# Patient Record
Sex: Female | Born: 1970 | Race: Black or African American | Hispanic: No | Marital: Married | Smoking: Never smoker
Health system: Southern US, Community
[De-identification: ages and names within clinical notes are randomized; demographics above are authoritative.]

## PROBLEM LIST (undated history)

## (undated) DIAGNOSIS — I10 Essential (primary) hypertension: Secondary | ICD-10-CM

## (undated) DIAGNOSIS — M545 Low back pain, unspecified: Secondary | ICD-10-CM

## (undated) DIAGNOSIS — E669 Obesity, unspecified: Secondary | ICD-10-CM

## (undated) DIAGNOSIS — F32A Depression, unspecified: Secondary | ICD-10-CM

## (undated) DIAGNOSIS — O039 Complete or unspecified spontaneous abortion without complication: Secondary | ICD-10-CM

## (undated) DIAGNOSIS — R0602 Shortness of breath: Secondary | ICD-10-CM

## (undated) DIAGNOSIS — R002 Palpitations: Secondary | ICD-10-CM

## (undated) DIAGNOSIS — R7989 Other specified abnormal findings of blood chemistry: Secondary | ICD-10-CM

## (undated) DIAGNOSIS — D649 Anemia, unspecified: Secondary | ICD-10-CM

## (undated) DIAGNOSIS — K5900 Constipation, unspecified: Secondary | ICD-10-CM

## (undated) DIAGNOSIS — M2550 Pain in unspecified joint: Secondary | ICD-10-CM

## (undated) DIAGNOSIS — N289 Disorder of kidney and ureter, unspecified: Secondary | ICD-10-CM

## (undated) DIAGNOSIS — L709 Acne, unspecified: Secondary | ICD-10-CM

## (undated) DIAGNOSIS — O1420 HELLP syndrome (HELLP), unspecified trimester: Secondary | ICD-10-CM

## (undated) DIAGNOSIS — E739 Lactose intolerance, unspecified: Secondary | ICD-10-CM

## (undated) DIAGNOSIS — R252 Cramp and spasm: Secondary | ICD-10-CM

## (undated) DIAGNOSIS — I319 Disease of pericardium, unspecified: Secondary | ICD-10-CM

## (undated) DIAGNOSIS — F419 Anxiety disorder, unspecified: Secondary | ICD-10-CM

## (undated) DIAGNOSIS — R079 Chest pain, unspecified: Secondary | ICD-10-CM

## (undated) DIAGNOSIS — G4730 Sleep apnea, unspecified: Secondary | ICD-10-CM

## (undated) DIAGNOSIS — E538 Deficiency of other specified B group vitamins: Secondary | ICD-10-CM

## (undated) DIAGNOSIS — E079 Disorder of thyroid, unspecified: Secondary | ICD-10-CM

## (undated) DIAGNOSIS — R5383 Other fatigue: Secondary | ICD-10-CM

## (undated) DIAGNOSIS — E559 Vitamin D deficiency, unspecified: Secondary | ICD-10-CM

## (undated) DIAGNOSIS — Z91018 Allergy to other foods: Secondary | ICD-10-CM

## (undated) HISTORY — DX: Obesity, unspecified: E66.9

## (undated) HISTORY — DX: Essential (primary) hypertension: I10

## (undated) HISTORY — DX: Disease of pericardium, unspecified: I31.9

## (undated) HISTORY — DX: Palpitations: R00.2

## (undated) HISTORY — DX: Vitamin D deficiency, unspecified: E55.9

## (undated) HISTORY — DX: Other fatigue: R53.83

## (undated) HISTORY — DX: Pain in unspecified joint: M25.50

## (undated) HISTORY — PX: DILATION AND CURETTAGE OF UTERUS: SHX78

## (undated) HISTORY — DX: Low back pain, unspecified: M54.50

## (undated) HISTORY — PX: ABDOMINOPLASTY: SHX5355

## (undated) HISTORY — PX: CERVICAL ABLATION: SHX5771

## (undated) HISTORY — DX: HELLP syndrome (HELLP), unspecified trimester: O14.20

## (undated) HISTORY — DX: Constipation, unspecified: K59.00

## (undated) HISTORY — DX: Sleep apnea, unspecified: G47.30

## (undated) HISTORY — DX: Acne, unspecified: L70.9

## (undated) HISTORY — DX: Cramp and spasm: R25.2

## (undated) HISTORY — DX: Lactose intolerance, unspecified: E73.9

## (undated) HISTORY — DX: Depression, unspecified: F32.A

## (undated) HISTORY — DX: Anxiety disorder, unspecified: F41.9

## (undated) HISTORY — DX: Disorder of thyroid, unspecified: E07.9

## (undated) HISTORY — DX: Complete or unspecified spontaneous abortion without complication: O03.9

## (undated) HISTORY — DX: Disorder of kidney and ureter, unspecified: N28.9

## (undated) HISTORY — DX: Chest pain, unspecified: R07.9

## (undated) HISTORY — DX: Anemia, unspecified: D64.9

## (undated) HISTORY — DX: Allergy to other foods: Z91.018

## (undated) HISTORY — PX: TUBAL LIGATION: SHX77

## (undated) HISTORY — DX: Deficiency of other specified B group vitamins: E53.8

## (undated) HISTORY — DX: Shortness of breath: R06.02

---

## 1998-04-13 ENCOUNTER — Other Ambulatory Visit: Admission: RE | Admit: 1998-04-13 | Discharge: 1998-04-13 | Payer: Self-pay | Admitting: Obstetrics and Gynecology

## 1999-04-25 ENCOUNTER — Other Ambulatory Visit: Admission: RE | Admit: 1999-04-25 | Discharge: 1999-04-25 | Payer: Self-pay | Admitting: Obstetrics and Gynecology

## 2002-06-27 ENCOUNTER — Other Ambulatory Visit: Admission: RE | Admit: 2002-06-27 | Discharge: 2002-06-27 | Payer: Self-pay | Admitting: Gynecology

## 2003-02-09 ENCOUNTER — Other Ambulatory Visit: Admission: RE | Admit: 2003-02-09 | Discharge: 2003-02-09 | Payer: Self-pay | Admitting: Gynecology

## 2003-08-30 ENCOUNTER — Encounter (INDEPENDENT_AMBULATORY_CARE_PROVIDER_SITE_OTHER): Payer: Self-pay | Admitting: Specialist

## 2003-08-30 ENCOUNTER — Ambulatory Visit (HOSPITAL_COMMUNITY): Admission: RE | Admit: 2003-08-30 | Discharge: 2003-08-30 | Payer: Self-pay | Admitting: Gynecology

## 2004-04-16 ENCOUNTER — Other Ambulatory Visit: Admission: RE | Admit: 2004-04-16 | Discharge: 2004-04-16 | Payer: Self-pay | Admitting: Gynecology

## 2004-09-14 ENCOUNTER — Inpatient Hospital Stay (HOSPITAL_COMMUNITY): Admission: AD | Admit: 2004-09-14 | Discharge: 2004-09-18 | Payer: Self-pay | Admitting: Gynecology

## 2004-09-15 ENCOUNTER — Encounter (INDEPENDENT_AMBULATORY_CARE_PROVIDER_SITE_OTHER): Payer: Self-pay | Admitting: *Deleted

## 2004-09-19 ENCOUNTER — Encounter: Admission: RE | Admit: 2004-09-19 | Discharge: 2004-10-19 | Payer: Self-pay | Admitting: Gynecology

## 2004-09-20 ENCOUNTER — Inpatient Hospital Stay (HOSPITAL_COMMUNITY): Admission: AD | Admit: 2004-09-20 | Discharge: 2004-09-20 | Payer: Self-pay | Admitting: Gynecology

## 2004-10-20 ENCOUNTER — Encounter: Admission: RE | Admit: 2004-10-20 | Discharge: 2004-10-29 | Payer: Self-pay | Admitting: Gynecology

## 2004-11-28 ENCOUNTER — Ambulatory Visit (HOSPITAL_BASED_OUTPATIENT_CLINIC_OR_DEPARTMENT_OTHER): Admission: RE | Admit: 2004-11-28 | Discharge: 2004-11-28 | Payer: Self-pay | Admitting: Gynecology

## 2005-10-13 ENCOUNTER — Encounter: Admission: RE | Admit: 2005-10-13 | Discharge: 2005-10-13 | Payer: Self-pay | Admitting: Surgical Oncology

## 2005-10-31 ENCOUNTER — Other Ambulatory Visit: Admission: RE | Admit: 2005-10-31 | Discharge: 2005-10-31 | Payer: Self-pay | Admitting: Gynecology

## 2007-01-15 ENCOUNTER — Other Ambulatory Visit: Admission: RE | Admit: 2007-01-15 | Discharge: 2007-01-15 | Payer: Self-pay | Admitting: Gynecology

## 2008-01-28 ENCOUNTER — Other Ambulatory Visit: Admission: RE | Admit: 2008-01-28 | Discharge: 2008-01-28 | Payer: Self-pay | Admitting: Gynecology

## 2009-02-20 ENCOUNTER — Ambulatory Visit: Payer: Self-pay | Admitting: Gynecology

## 2009-02-20 ENCOUNTER — Other Ambulatory Visit: Admission: RE | Admit: 2009-02-20 | Discharge: 2009-02-20 | Payer: Self-pay | Admitting: Gynecology

## 2009-02-20 ENCOUNTER — Encounter: Payer: Self-pay | Admitting: Gynecology

## 2009-02-27 ENCOUNTER — Ambulatory Visit: Payer: Self-pay | Admitting: Gynecology

## 2009-04-24 ENCOUNTER — Encounter (HOSPITAL_COMMUNITY): Admission: RE | Admit: 2009-04-24 | Discharge: 2009-05-24 | Payer: Self-pay | Admitting: Endocrinology

## 2010-02-22 ENCOUNTER — Other Ambulatory Visit: Admission: RE | Admit: 2010-02-22 | Discharge: 2010-02-22 | Payer: Self-pay | Admitting: Gynecology

## 2010-02-22 ENCOUNTER — Ambulatory Visit: Payer: Self-pay | Admitting: Gynecology

## 2010-09-16 ENCOUNTER — Encounter: Payer: Self-pay | Admitting: Endocrinology

## 2011-01-10 NOTE — Op Note (Signed)
NAMESHAVONTE, ZHAO               ACCOUNT NO.:  [id number]   MEDICAL RECORD NO.:  [id number]          PATIENT TYPE:  INP   LOCATION:  9374                          FACILITY:  [facility]   PHYSICIAN:  Juan H. Lily Peer, M.D.DATE OF BIRTH:  01-Jun-1971   DATE OF PROCEDURE:  09/15/2004  DATE OF DISCHARGE:                                 OPERATIVE REPORT   INDICATION FOR OPERATION:  A 40 year old, gravida 3, para 1, AB 1 at [redacted]  weeks gestation with severe preeclampsia/HELLP syndrome, and an unfavorable  cervix.  The patient had been given, in MAU, several doses of labetalol to  bring her blood pressure down, for a total of 100 mg IV.  She had also had  received dexamethasone 10 mg IV as well, and platelets were to be transfused  in the operating room.   PREOPERATIVE DIAGNOSES:  1.  A 33 week intrauterine pregnancy.  2.  Severe preeclampsia (hemolysis, elevated liver enzymes, and low platelet      count).  3.  Unfavorable cervix.   POSTOPERATIVE DIAGNOSES:  1.  A 33 week intrauterine pregnancy.  2.  Severe preeclampsia (hemolysis, elevated liver enzymes, and low platelet      count).  3.  Unfavorable cervix.   ANESTHESIA:  General endotracheal anesthesia.   SURGEON:  Juan H. Lily Peer, M.D.   PROCEDURE PERFORMED:  Emergency primary lower uterine segment transverse  cesarean section.   FINDINGS:  Viable female infant, Apgars of 6 and 8, weight 1841 g, arterial  cord pH of 7.27, clear amniotic fluid, normal maternal pelvic anatomy.   DESCRIPTION OF OPERATION:  After the patient was adequately counseled, she  was taken to the operating room where she underwent successful general  endotracheal anesthesia.  The abdomen was prepped and draped in the usual  sterile fashion.  Pfannenstiel skin incision was made 2 cm above the  symphysis pubis.  The incision was carried down through the skin,  subcutaneous tissue, down to the rectus fascia, whereby a midline nick was  made.  The fascia was  incised in a transverse fashion.  The midline raphe  was entered.  The peritoneal cavity was entered cautiously.  The bladder  flap was established.  The lower uterine segment was incised in a transverse  fashion.  Clear amniotic fluid was present.  The newborn was delivered,  nasopharyngeal air with bulb suction, and the cord was doubly clamped and  excised and passed off to the neonatologists who were in attendance, who  gave the above-mentioned parameters.  After cord blood was obtained, the  placenta was delivered from the intrauterine cavity and submitted for  histological evaluation.  The uterus was then exteriorized.  The  intrauterine cavity was swept clear of remaining products of conception.  The transverse uterine incision was closed in a running locking stitch  fashion with 0 Vicryl suture.  Several areas continued to ooze requiring  pressure and due to the fact that her platelets were low, it was decided to  abort the bilateral tubal sterilization in the event of increased risk for  bleeding, and sterilization will be  done at a later date.  After compression  on the lower uterine segment and ascertaining adequate hemostasis after  several hemostatic sutures, the uterus was then re-placed back into the  pelvic cavity.  The pelvic cavity was copiously irrigated with normal saline  solution.  The transverse incision was inspected again; no bleeding was  present.  The visceral peritoneum was not reapproximated.  The rectus fascia  was closed with a running locking stitch of 0 Vicryl suture, and the skin  was reapproximated with skin clips followed by placing Xeroform gauze and  pressure dressing.  The patient was extubated and transferred to recovery  room with stable vital signs.  Blood loss for procedure was 1000 mL.  IV  fluids 1020 mL of lactated Ringer's.  The patient received 1 unit of  platelets, transfused in the operating room.  Urine output was 800 mL.  She  received 2 g  of Ancef prophylactically.  Placenta was submitted to  pathology.  Neonatologist was in attendance, and sponge count and needle  count were correct.     Juan   JHF/MEDQ  D:  09/15/2004  T:  09/15/2004  Job:  [id number]

## 2011-01-10 NOTE — H&P (Signed)
[name], [name]                           ACCOUNT NO.:  [id number]   MEDICAL RECORD NO.:  [id number]                   PATIENT TYPE:  AMB   LOCATION:  SDC                                  FACILITY:  [facility]   PHYSICIAN:  Timothy P. Fontaine, M.D.           DATE OF BIRTH:  Nov 17, 1970   DATE OF ADMISSION:  08/30/2003  DATE OF DISCHARGE:                                HISTORY & PHYSICAL   CHIEF COMPLAINT:  Missed abortion.   HISTORY OF PRESENT ILLNESS:  A 40 year old G2, P0, female at approximately  six weeks with a missed abortion.  She has been followed with serial  ultrasounds with a persistent gestational sac consistent with m missed  abortion.  She is admitted for a suction D&C.  Her blood type is O positive.   PAST MEDICAL HISTORY:  Uncomplicated.   PAST SURGICAL HISTORY:  Uncomplicated.   ALLERGIES:  IODINE.   REVIEW OF SYSTEMS:  Noncontributory.   FAMILY HISTORY:  Noncontributory.   SOCIAL HISTORY:  Noncontributory.   ADMISSION PHYSICAL EXAMINATION:  VITAL SIGNS:  Afebrile, vital signs are  stable.  HEENT:  Normal.  CHEST:  Lungs are clear.  CARDIAC:  Regular rate, no rubs, murmurs, or gallops.  ABDOMEN:  Benign.  PELVIC:  External, BUS, vagina normal.  Cervix grossly normal with some  blood-staining, closed.  Uterus grossly normal in size, soft.  Adnexa  without masses or tenderness.   ASSESSMENT:  A 40 year old with missed abortion, attempted expectant  management without passage of tissue, now for D&C.  Risks, benefits,  indications, and alternatives for suction D&C were discussed with the  patient and her husband to include expected intraoperative and postoperative  courses.  The risks of bleeding, transfusion, infection, uterine  perforation, damage to internal organs including bowel, bladder, ureters,  vessels, and nerves necessitating major exploratory or reparative surgeries  and future reparative surgeries including ostomy formation, were all  discussed,  understood, and accepted.  [name] questions were answered  and she is ready to proceed with surgery.  Blood type is O positive.                                               Timothy P. Audie Box, M.D.    TPF/MEDQ  D:  08/30/2003  T:  08/30/2003  Job:  [id number]

## 2011-01-10 NOTE — Op Note (Signed)
NAMEAVERLEIGH, [name]               ACCOUNT NO.:  [id number]   MEDICAL RECORD NO.:  [id number]          PATIENT TYPE:  AMB   LOCATION:  NESC                         FACILITY:  [facility]   PHYSICIAN:  Juan H. Lily Peer, M.D.DATE OF BIRTH:  [birth year]   DATE OF PROCEDURE:  11/28/2004  DATE OF DISCHARGE:                                 OPERATIVE REPORT   INDICATION FOR OPERATION:  A 40 year old gravida 3, para 2, AB 1, with the  request for elective permanent sterilization.  The patient previously  cancelled preoperatively and had been given literature information from the  [employer] of [facility]/GY[facility] on laparoscopic tubal sterilization.  The patient  is fully aware that this is a permanent form of sterilization and that she  would not be able to have any more children.   PREOPERATIVE DIAGNOSIS:  Request for elective permanent sterilization.   POSTOPERATIVE DIAGNOSIS:  Request for elective permanent sterilization.   ANESTHESIA:  General endotracheal.   SURGEON:  Juan H. Lily Peer, M.D.   PROCEDURE PERFORMED:  Laparoscopic tubal sterilization.  Bilateral  cauterization with transection of fallopian tubes.   FINDINGS:  Retroverted uterus, normal fallopian tubes and ovaries.  Endometriotic implant at the fundal posterior uterus.  Normal-appearing  surface of the liver and surface of gallbladder seen.  The appendix not  visualized.   DESCRIPTION OF OPERATION:  After the patient was adequately counseled, she  was taken to the operating room where she underwent a successful general  endotracheal anesthesia.  She had received 1 g of Cefotan for prophylaxis.  The abdomen, vagina, and perineum were prepped and draped in the usual  sterile fashion.  Examination under anesthesia demonstrated a slightly  retroverted uterus.  A Hulka tenaculum was placed for manipulation during  the laparoscopic procedure, and the bladder had been evacuated of its  contents with a red rubber Roxan Hockey for  approximately 50 mL.  After the  drapes were in place, a small stab incision was made underneath the  umbilicus followed by insertion of the Veress needle.  Opening intra-  abdominal pressure was 5 mmHg.  A 10 mm trocar was inserted into the  peritoneal cavity.  A second puncture site with a 5 mm trocar was made  approximately 2 cm above the symphysis pubis in the midline whereby a probe  was inserted through the 5 mm port, and a systematic inspection of the  pelvis demonstrated a retroverted uterus, normal tubes and ovary, but an  endometriotic implant at the fundal posterior aspect of the uterine serosa  was noted.  The probe was removed, and a self-retaining grasper was placed  at the distal portion of the right fallopian tube and with a Kleppinger  forceps, a 2 cm segment was cauterized to the level of the mesosalpinx and  with laparoscopic scissors the fallopian tube was transected at the proximal  portion of the fallopian tube.  Similar procedure was carried out on the  contralateral side.  Pre and poststerilization pictures were obtained.  A  copy will be kept in the patient's record at [facility], and a  second set  will be kept in the patient's record at [facility] for  patient's review and to keep as part of her permanent record.  The fundal  aspect of the uterine serosa was cauterized.  After completion of the  operation, there was hemostasis that was evident.  The instruments were  removed after the carbon dioxide was removed from the peritoneal cavity, and  the subumbilical fascia was approximated with a figure-of-eight of 0 Vicryl  suture.  The subcuticular stitch was reapproximated with the 4-0 plain  catgut suture, and the skin was reapproximated at both port sites with  Dermabond glue, and 0.25% Marcaine was infiltrated at both incision sites  for  postoperative analgesia for a total of 10 mL.  The Hulka tenaculum was  removed; the cervix was inspected,  and no bleeding.  The patient was  extubated, transferred to recovery room with stable vital signs.  Blood loss  from the procedure was minimal, and fluid resuscitation consisted of 1000 mL  of lactated Ringer's.      JHF/MEDQ  D:  11/28/2004  T:  11/28/2004  Job:  [id number]

## 2011-01-10 NOTE — Discharge Summary (Signed)
[name], [name]               ACCOUNT NO.:  [id number]   MEDICAL RECORD NO.:  [id number]          PATIENT TYPE:  INP   LOCATION:  9129                          FACILITY:  [facility]   PHYSICIAN:  Juan H. Lily Peer, M.D.DATE OF BIRTH:  April 23, 1971   DATE OF ADMISSION:  09/14/2004  DATE OF DISCHARGE:  09/18/2004                                 DISCHARGE SUMMARY   HISTORY:  The patient is a 40 year old gravida 3, para 1, ab 1, who was  admitted to [facility] on January 22 at [redacted] weeks gestation with acute  onset of right upper quadrant pain on and off for the previous few days.  She was found to be hypertensive, denied any visual disturbances or  headaches.  Her blood pressures were as follows:  170/103, 173/98, 171/107,  166/106, and 179/104.  [name] liver function tests had been found to  be elevated, SGOT at 144, SGPT at 210, LDH 302, platelet count 39,000,  hemoglobin 12.8, hematocrit 36.6.  PT, PTT and INR were normal.  Fibrinogen  was elevated at 570.  Uric acid was normal.  D-dimer was slightly elevated.  She had greater than 300 mg/dl of proteinuria.  Her reflexes were fine.  Based on these findings and an unfavorable cervix, she was taken to the  operating room for a primary cesarean section.  Platelets were ordered to be  transfused which were started at the time of her cesarean section.  Prior to  that, she had been started on dexamethasone at 10 mg IV push and a second  dose was administered 12 hours later followed by 5 mg q. 12 times two.  She  delivered via primary lower uterine segment transverse cesarean section of a  viable female infant,  Apgars of 6 and 8 with a weight of 1941 g, arterial  cord pH 7.27.  There was no maternal pelvic anatomical abnormalities.  She  had voiced wanting to have her tubes tied and we decided to wait due to the  fact that she had some oozing from the incision site at time of closure and  better to wait six weeks post partum to see  the outcome to ensure that this  premature baby did well.  The patient was kept in the intensive care unit  for 24 hours and her platelet count slowly began to rise to a value of  86,000 and a second unit of platelets was administered.  Her liver function  tests began to decrease daily in the near normal range since she was on the  floor and continued to remain normotensive.  Before her Foley catheter was  removed, she had copious urine output and postoperatively did well.  Her  hemoglobin was 8.6 and at time of discharge was stable.  She was afebrile.  She was up ambulating, tolerating a regular diet well and was ready for  discharge home.   FINAL DIAGNOSES:  1.  Preterm delivery 33 weeks estimated gestational age.  2.  Severe HELP syndrome.  3.  Anemia.   PROCEDURES PERFORMED:  1.  Fetal monitoring.  2.  Obstetrical ultrasound.  3.  Administration of dexamethasone IV.  4.  Transfusion of two units of platelets.  5.  Primary lower uterine segment transverse cesarean section.   FINAL DISPOSITION AND FOLLOWUP:  The patient was discharged home on her  third postoperative day.  She was up ambulating and tolerating a regular  diet well and passed flatus and had a bowel movement.  She was to have her  staples removed, incision steri-stripped and she was given a prescription  for Lortab 7.5/500 to take one by mouth every four to six hours as needed  for pain.  She is also to start iron tablets, one by mouth daily.  Discharge  instruction sheet was provided and she is to follow up in the [facility]  gynecology office in six weeks to discuss laparoscopic tubal sterilization  procedure and to schedule accordingly as well as to follow-up with a  hepatitis B surface antigen and HIV in six months because of her platelet  transfusion.      JHF/MEDQ  D:  09/18/2004  T:  09/18/2004  Job:  [id number]

## 2011-01-10 NOTE — H&P (Signed)
[name], [name]               ACCOUNT NO.:  [id number]   MEDICAL RECORD NO.:  [id number]          PATIENT TYPE:   LOCATION:                                 FACILITY:   PHYSICIAN:  Juan H. Lily Peer, M.D.     DATE OF BIRTH:   DATE OF ADMISSION:  11/28/2004  DATE OF DISCHARGE:                                HISTORY & PHYSICAL   CHIEF COMPLAINT:  Request for elective permanent sterilization.   HISTORY:  The patient is a 40 year old, gravida 3, para 2, AB1 who was seen  in the office on October 29, 2004, for a six-week postpartum visit and at that  point had requested information as well as to schedule for permanent  sterilization.  She had been provided with literature information from the  [employer] of OB/GYN.  The risks, benefits, and pros and cons of the  operation were discussed.   PAST MEDICAL HISTORY:  1.  With the most recent pregnancy, the patient had HELLP syndrome as she      did with her first pregnancy as well.  2.  She has had one vaginal delivery.  3.  One cesarean section.  4.  With the last pregnancy she had severe thrombocytopenia and had received      transfusion of two units of packed red blood cells, subsequently she had      a hepatitis B surface antigen and HIV which were negative on the office      visit on March 7.  5.  She is currently just using barrier contraception.   She is allergic to CODEINE.   Alcohol consumption on an occasional basis.  No smoking reported.   FAMILY HISTORY:  Grandmother and cousin with diabetes and grandfather with  cardiovascular disease.   PHYSICAL EXAMINATION:  VITAL SIGNS:  The patient is 5 feet 6 and 3/4 inches  tall, 165 pounds, blood pressure 122/80.  HEENT:  Unremarkable.  NECK:  Supple.  Trachea midline.  No carotid bruits.  No thyromegaly.  LUNGS:  Clear to auscultation without rhonchi or wheezes.  HEART:  Regular rate and rhythm.  No murmurs or gallops.  BREAST:  Done during the postpartum visit with no  abnormalities to detected.  PELVIC:  Bartholin, urethral, and Skene __________  normal limits.  Vagina  and cervix no lesions or discharge.  Uterus anteverted, normal size shape,  and consistency.  Adnexa without any mass or tenderness.  RECTAL:  Deferred.   ASSESSMENT:  A 40 year old gravida 3, para 2, abortion 1 with a request for  elective permanent sterilization.   She had previously been provided with literature, information from the  [employer] of [facility]/GYN on laparoscopic tubal sterilization.  The risks  benefits and pros and cons of the procedure as well as failure rates were  discussed.  She is also fully aware that she will not be able to have any  more children after such procedure and feels comfortable with this decision  as well as her husband.  Potential complications for surgery were discussed  including infection, although she will receive prophylaxis antibiotics,  trauma to internal organ requiring open laparotomy or inaccessibility to the  laparoscopic technique to accomplish the laparoscopic sterilization which  will require an open laparotomy to gain access to the pelvic cavity and  complete the operation, risk of hemorrhage requiring blood transfusion with  its potential risk of anaphylactic, hepatitis, and AIDs were discussed, also  repair of any internal organ damage from the laparoscopic technique or open  laparotomy technique were discussed as well.  The patient is fully aware of  all the above risks and the permanent status after having tubal  sterilization.  All questions were answered and will follow accordingly.   PLAN:  The patient is scheduled for a laparoscopic tubal sterilization  procedure on Thursday, November 28, 2004 at 8:30 a.m. at [facility].      JHF/MEDQ  D:  11/27/2004  T:  11/27/2004  Job:  [id number]

## 2011-01-10 NOTE — Op Note (Signed)
[name], [name]                           ACCOUNT NO.:  [id number]   MEDICAL RECORD NO.:  [id number]                   PATIENT TYPE:  AMB   LOCATION:  SDC                                  [facility]ACILITY:  [facility]   PHYSICIAN:  Timothy P. Fontaine, M.D.           DATE OF BIRTH:  [birth year]   DATE OF PROCEDURE:  08/30/2003  DATE OF DISCHARGE:                                 OPERATIVE REPORT   PREOPERATIVE DIAGNOSIS:  Missed abortion.   POSTOPERATIVE DIAGNOSIS:  Missed abortion.   PROCEDURE:  Suction D&C.   SURGEON:  Timothy P. Fontaine, M.D.   ANESTHESIA:  MAC 1% lidocaine paracervical block.   COMPLICATIONS:  None.   ESTIMATED BLOOD LOSS:  Minimal.   SPECIMENS:  Products of conception.   FINDINGS:  Examination under anesthesia with uterus anteverted, grossly  normal in size, soft.  Adnexa without masses.   DESCRIPTION OF PROCEDURE:  The patient was taken to the operating room and  underwent IV sedation and was placed in the low dorsal lithotomy position  and received a perineal vaginal preparation with Hibiclens solution and was  draped in the usual fashion.  The bladder was emptied with in and out Foley  catheterization.  Examination under anesthesia performed.  Subsequently the  cervix was visualized with a speculum.  Single-tooth tenaculum applied to  the anterior lip and a paracervical block using 1% lidocaine was delivered,  a total of 10 mL.  The cervix was then gently and gradually dilated to admit  a #7 curette and the suction curettage was performed without difficulty.  Gross POC was retrieved.  A gentle sharp curettage was performed to assure  complete emptying.  Instruments were then removed.  Adequate hemostasis  visualized.  The patient was placed in the supine position, taken to the  recovery room in good condition having tolerated the procedure well.  The  patient's blood type is O positive.                                               Timothy P. Audie Box,  M.D.    TPF/MEDQ  D:  08/30/2003  T:  08/30/2003  Job:  [id number]

## 2011-02-28 ENCOUNTER — Encounter: Payer: Self-pay | Admitting: *Deleted

## 2011-03-06 ENCOUNTER — Encounter (INDEPENDENT_AMBULATORY_CARE_PROVIDER_SITE_OTHER): Payer: BC Managed Care – PPO | Admitting: Gynecology

## 2011-03-06 ENCOUNTER — Other Ambulatory Visit: Payer: Self-pay | Admitting: Gynecology

## 2011-03-06 ENCOUNTER — Other Ambulatory Visit (HOSPITAL_COMMUNITY)
Admission: RE | Admit: 2011-03-06 | Discharge: 2011-03-06 | Disposition: A | Discharge status: Home / Self Care | Payer: BC Managed Care – PPO | Source: Ambulatory Visit | Attending: Gynecology | Admitting: Gynecology

## 2011-03-06 DIAGNOSIS — N926 Irregular menstruation, unspecified: Secondary | ICD-10-CM

## 2011-03-06 DIAGNOSIS — Z01419 Encounter for gynecological examination (general) (routine) without abnormal findings: Secondary | ICD-10-CM

## 2011-03-06 DIAGNOSIS — N920 Excessive and frequent menstruation with regular cycle: Secondary | ICD-10-CM

## 2011-03-06 DIAGNOSIS — R635 Abnormal weight gain: Secondary | ICD-10-CM

## 2011-03-06 DIAGNOSIS — Z124 Encounter for screening for malignant neoplasm of cervix: Secondary | ICD-10-CM | POA: Insufficient documentation

## 2011-03-06 DIAGNOSIS — R82998 Other abnormal findings in urine: Secondary | ICD-10-CM

## 2011-03-06 DIAGNOSIS — Z833 Family history of diabetes mellitus: Secondary | ICD-10-CM

## 2011-03-06 DIAGNOSIS — N946 Dysmenorrhea, unspecified: Secondary | ICD-10-CM

## 2011-03-06 DIAGNOSIS — E0590 Thyrotoxicosis, unspecified without thyrotoxic crisis or storm: Secondary | ICD-10-CM

## 2011-03-06 DIAGNOSIS — Z13220 Encounter for screening for lipoid disorders: Secondary | ICD-10-CM

## 2011-04-02 ENCOUNTER — Other Ambulatory Visit: Payer: Self-pay | Admitting: Gynecology

## 2011-04-02 DIAGNOSIS — Z1231 Encounter for screening mammogram for malignant neoplasm of breast: Secondary | ICD-10-CM

## 2011-05-12 ENCOUNTER — Ambulatory Visit
Admission: RE | Admit: 2011-05-12 | Discharge: 2011-05-12 | Disposition: A | Discharge status: Home / Self Care | Payer: BC Managed Care – PPO | Source: Ambulatory Visit | Attending: Gynecology | Admitting: Gynecology

## 2011-05-12 DIAGNOSIS — Z1231 Encounter for screening mammogram for malignant neoplasm of breast: Secondary | ICD-10-CM

## 2011-09-13 ENCOUNTER — Ambulatory Visit (INDEPENDENT_AMBULATORY_CARE_PROVIDER_SITE_OTHER): Payer: BC Managed Care – PPO

## 2011-09-13 DIAGNOSIS — Z23 Encounter for immunization: Secondary | ICD-10-CM

## 2011-11-05 ENCOUNTER — Encounter: Payer: Self-pay | Admitting: Gynecology

## 2011-11-05 ENCOUNTER — Ambulatory Visit (INDEPENDENT_AMBULATORY_CARE_PROVIDER_SITE_OTHER): Payer: BC Managed Care – PPO | Admitting: Gynecology

## 2011-11-05 VITALS — BP 132/90

## 2011-11-05 DIAGNOSIS — R0789 Other chest pain: Secondary | ICD-10-CM

## 2011-11-05 DIAGNOSIS — R079 Chest pain, unspecified: Secondary | ICD-10-CM

## 2011-11-05 DIAGNOSIS — M940 Chondrocostal junction syndrome [Tietze]: Secondary | ICD-10-CM

## 2011-11-05 MED ORDER — CYCLOBENZAPRINE HCL 5 MG PO TABS: 5.0000 mg | ORAL_TABLET | Freq: Two times a day (BID) | ORAL | Status: AC | PRN

## 2011-11-05 MED ORDER — TRANEXAMIC ACID 650 MG PO TABS: 1300.0000 mg | ORAL_TABLET | Freq: Three times a day (TID) | ORAL | Status: DC

## 2011-11-05 MED ORDER — IBUPROFEN 800 MG PO TABS: 800.0000 mg | ORAL_TABLET | Freq: Three times a day (TID) | ORAL | Status: AC | PRN

## 2011-11-05 NOTE — Patient Instructions (Signed)
Costochondritis  Costochondritis (Tietze syndrome), or costochondral separation, is a swelling and irritation (inflammation) of the tissue (cartilage) that connects your ribs with your breastbone (sternum). It may occur on its own (spontaneously), through damage caused by an accident (trauma), or simply from coughing or minor exercise. It may take up to 6 weeks to get better and longer if you are unable to be conservative in your activities.  HOME CARE INSTRUCTIONS    Avoid exhausting physical activity. Try not to strain your ribs during normal activity. This would include any activities using chest, belly (abdominal), and side muscles, especially if heavy weights are used.   Use ice for 15 to 20 minutes per hour while awake for the first 2 days. Place the ice in a plastic bag, and place a towel between the bag of ice and your skin.   Only take over-the-counter or prescription medicines for pain, discomfort, or fever as directed by your caregiver.  SEEK IMMEDIATE MEDICAL CARE IF:    Your pain increases or you are very uncomfortable.   You have a fever.   You develop difficulty with your breathing.   You cough up blood.   You develop worse chest pains, shortness of breath, sweating, or vomiting.   You develop new, unexplained problems (symptoms).  MAKE SURE YOU:    Understand these instructions.   Will watch your condition.   Will get help right away if you are not doing well or get worse.  Document Released: 05/21/2005 Document Revised: 07/31/2011 Document Reviewed: 03/29/2008  [employer] Patient Information 2012 [employer].

## 2011-11-05 NOTE — Progress Notes (Signed)
The patient is a 41 year old gravida 3 para 2 AB 1 who presented to the office today complaining of right anterior chest wall pain for 2 weeks. Patient denies any recent trauma or injury. She denied any palpable masses or nipple discharge. She is currently not using any form of contraception and her menstrual cycles are regular.  Exam: Both breasts were examined in sitting and supine position and appeared to be symmetrical in appearance no skin discoloration no nipple inversion no palpable masses no supraclavicular axillary lymphadenopathy. She was tender on her manubrium/costochondral junction which reproduced her symptoms.  Assessment/plan: Costochondritis. Patient will be placed on Motrin 800 mg 3 times a day for 5-7 days. She will also be given a muscle relaxant such as Flexeril to take 5 mg twice a day for 5-7 days. She was instructed to continue to do her monthly self breast examination. Literature information was provided.

## 2012-04-02 ENCOUNTER — Encounter: Payer: Self-pay | Admitting: Gynecology

## 2012-04-02 ENCOUNTER — Other Ambulatory Visit: Payer: Self-pay | Admitting: Gynecology

## 2012-04-02 ENCOUNTER — Ambulatory Visit (INDEPENDENT_AMBULATORY_CARE_PROVIDER_SITE_OTHER): Payer: BC Managed Care – PPO | Admitting: Gynecology

## 2012-04-02 VITALS — BP 124/80 | Ht 66.25 in | Wt 174.0 lb

## 2012-04-02 DIAGNOSIS — Z8639 Personal history of other endocrine, nutritional and metabolic disease: Secondary | ICD-10-CM | POA: Insufficient documentation

## 2012-04-02 DIAGNOSIS — N920 Excessive and frequent menstruation with regular cycle: Secondary | ICD-10-CM

## 2012-04-02 DIAGNOSIS — Z862 Personal history of diseases of the blood and blood-forming organs and certain disorders involving the immune mechanism: Secondary | ICD-10-CM

## 2012-04-02 DIAGNOSIS — R635 Abnormal weight gain: Secondary | ICD-10-CM

## 2012-04-02 DIAGNOSIS — Z833 Family history of diabetes mellitus: Secondary | ICD-10-CM | POA: Insufficient documentation

## 2012-04-02 DIAGNOSIS — Z1231 Encounter for screening mammogram for malignant neoplasm of breast: Secondary | ICD-10-CM

## 2012-04-02 DIAGNOSIS — Z01419 Encounter for gynecological examination (general) (routine) without abnormal findings: Secondary | ICD-10-CM

## 2012-04-02 NOTE — Patient Instructions (Addendum)
Health Maintenance, Females A healthy lifestyle and preventative care can promote health and wellness.  Maintain regular health, dental, and eye exams.   Eat a healthy diet. Foods like vegetables, fruits, whole grains, low-fat dairy products, and lean protein foods contain the nutrients you need without too many calories. Decrease your intake of foods high in solid fats, added sugars, and salt. Get information about a proper diet from your caregiver, if necessary.   Regular physical exercise is one of the most important things you can do for your health. Most adults should get at least 150 minutes of moderate-intensity exercise (any activity that increases your heart rate and causes you to sweat) each week. In addition, most adults need muscle-strengthening exercises on 2 or more days a week.    Maintain a healthy weight. The body mass index (BMI) is a screening tool to identify possible weight problems. It provides an estimate of body fat based on height and weight. Your caregiver can help determine your BMI, and can help you achieve or maintain a healthy weight. For adults 20 years and older:   A BMI below 18.5 is considered underweight.   A BMI of 18.5 to 24.9 is normal.   A BMI of 25 to 29.9 is considered overweight.   A BMI of 30 and above is considered obese.   Maintain normal blood lipids and cholesterol by exercising and minimizing your intake of saturated fat. Eat a balanced diet with plenty of fruits and vegetables. Blood tests for lipids and cholesterol should begin at age 44 and be repeated every 5 years. If your lipid or cholesterol levels are high, you are over 50, or you are a high risk for heart disease, you may need your cholesterol levels checked more frequently.Ongoing high lipid and cholesterol levels should be treated with medicines if diet and exercise are not effective.                                                      Cholesterol Control Diet  Cholesterol levels  in your body are determined significantly by your diet. Cholesterol levels may also be related to heart disease. The following material helps to explain this relationship and discusses what you can do to help keep your heart healthy. Not all cholesterol is bad. Low-density lipoprotein (LDL) cholesterol is the "bad" cholesterol. It may cause fatty deposits to build up inside your arteries. High-density lipoprotein (HDL) cholesterol is "good." It helps to remove the "bad" LDL cholesterol from your blood. Cholesterol is a very important risk factor for heart disease. Other risk factors are high blood pressure, smoking, stress, heredity, and weight. The heart muscle gets its supply of blood through the coronary arteries. If your LDL cholesterol is high and your HDL cholesterol is low, you are at risk for having fatty deposits build up in your coronary arteries. This leaves less room through which blood can flow. Without sufficient blood and oxygen, the heart muscle cannot function properly and you may feel chest pains (angina pectoris). When a coronary artery closes up entirely, a part of the heart muscle may die, causing a heart attack (myocardial infarction). CHECKING CHOLESTEROL When your caregiver sends your blood to a lab to be analyzed for cholesterol, a complete lipid (fat) profile may be done. With this test, the total amount of cholesterol and  levels of LDL and HDL are determined. Triglycerides are a type of fat that circulates in the blood and can also be used to determine heart disease risk. The list below describes what the numbers should be: Test: Total Cholesterol.  Less than 200 mg/dl.  Test: LDL "bad cholesterol."  Less than 100 mg/dl.   Less than 70 mg/dl if you are at very high risk of a heart attack or sudden cardiac death.  Test: HDL "good cholesterol."  Greater than 50 mg/dl for women.   Greater than 40 mg/dl for men.  Test: Triglycerides.  Less than 150 mg/dl.  CONTROLLING  CHOLESTEROL WITH DIET Although exercise and lifestyle factors are important, your diet is key. That is because certain foods are known to raise cholesterol and others to lower it. The goal is to balance foods for their effect on cholesterol and more importantly, to replace saturated and trans fat with other types of fat, such as monounsaturated fat, polyunsaturated fat, and omega-3 fatty acids. On average, a person should consume no more than 15 to 17 g of saturated fat daily. Saturated and trans fats are considered "bad" fats, and they will raise LDL cholesterol. Saturated fats are primarily found in animal products such as meats, butter, and cream. However, that does not mean you need to sacrifice all your favorite foods. Today, there are good tasting, low-fat, low-cholesterol substitutes for most of the things you like to eat. Choose low-fat or nonfat alternatives. Choose round or loin cuts of red meat, since these types of cuts are lowest in fat and cholesterol. Chicken (without the skin), fish, veal, and ground [place] breast are excellent choices. Eliminate fatty meats, such as hot dogs and salami. Even shellfish have little or no saturated fat. Have a 3 oz (85 g) portion when you eat lean meat, poultry, or fish. Trans fats are also called "partially hydrogenated oils." They are oils that have been scientifically manipulated so that they are solid at room temperature resulting in a longer shelf life and improved taste and texture of foods in which they are added. Trans fats are found in stick margarine, some tub margarines, cookies, crackers, and baked goods.  When baking and cooking, oils are an excellent substitute for butter. The monounsaturated oils are especially beneficial since it is believed they lower LDL and raise HDL. The oils you should avoid entirely are saturated tropical oils, such as coconut and palm.  Remember to eat liberally from food groups that are naturally free of saturated and trans  fat, including fish, fruit, vegetables, beans, grains (barley, rice, couscous, bulgur wheat), and pasta (without cream sauces).  IDENTIFYING FOODS THAT LOWER CHOLESTEROL  Soluble fiber may lower your cholesterol. This type of fiber is found in fruits such as apples, vegetables such as broccoli, potatoes, and carrots, legumes such as beans, peas, and lentils, and grains such as barley. Foods fortified with plant sterols (phytosterol) may also lower cholesterol. You should eat at least 2 g per day of these foods for a cholesterol lowering effect.  Read package labels to identify low-saturated fats, trans fats free, and low-fat foods at the supermarket. Select cheeses that have only 2 to 3 g saturated fat per ounce. Use a heart-healthy tub margarine that is free of trans fats or partially hydrogenated oil. When buying baked goods (cookies, crackers), avoid partially hydrogenated oils. Breads and muffins should be made from whole grains (whole-wheat or whole oat flour, instead of "flour" or "enriched flour"). Buy non-creamy canned soups with reduced  salt and no added fats.  FOOD PREPARATION TECHNIQUES  Never deep-fry. If you must fry, either stir-fry, which uses very little fat, or use non-stick cooking sprays. When possible, broil, bake, or roast meats, and steam vegetables. Instead of dressing vegetables with butter or margarine, use lemon and herbs, applesauce and cinnamon (for squash and sweet potatoes), nonfat yogurt, salsa, and low-fat dressings for salads.  LOW-SATURATED FAT / LOW-FAT FOOD SUBSTITUTES Meats / Saturated Fat (g)  Avoid: Steak, marbled (3 oz/85 g) / 11 g   Choose: Steak, lean (3 oz/85 g) / 4 g   Avoid: Hamburger (3 oz/85 g) / 7 g   Choose: Hamburger, lean (3 oz/85 g) / 5 g   Avoid: Ham (3 oz/85 g) / 6 g   Choose: Ham, lean cut (3 oz/85 g) / 2.4 g   Avoid: Chicken, with skin, dark meat (3 oz/85 g) / 4 g   Choose: Chicken, skin removed, dark meat (3 oz/85 g) / 2 g   Avoid:  Chicken, with skin, light meat (3 oz/85 g) / 2.5 g   Choose: Chicken, skin removed, light meat (3 oz/85 g) / 1 g  Dairy / Saturated Fat (g)  Avoid: Whole milk (1 cup) / 5 g   Choose: Low-fat milk, 2% (1 cup) / 3 g   Choose: Low-fat milk, 1% (1 cup) / 1.5 g   Choose: Skim milk (1 cup) / 0.3 g   Avoid: Hard cheese (1 oz/28 g) / 6 g   Choose: Skim milk cheese (1 oz/28 g) / 2 to 3 g   Avoid: Cottage cheese, 4% fat (1 cup) / 6.5 g   Choose: Low-fat cottage cheese, 1% fat (1 cup) / 1.5 g   Avoid: Ice cream (1 cup) / 9 g   Choose: Sherbet (1 cup) / 2.5 g   Choose: Nonfat frozen yogurt (1 cup) / 0.3 g   Choose: Frozen fruit bar / trace   Avoid: Whipped cream (1 tbs) / 3.5 g   Choose: Nondairy whipped topping (1 tbs) / 1 g  Condiments / Saturated Fat (g)  Avoid: Mayonnaise (1 tbs) / 2 g   Choose: Low-fat mayonnaise (1 tbs) / 1 g   Avoid: Butter (1 tbs) / 7 g   Choose: Extra light margarine (1 tbs) / 1 g   Avoid: Coconut oil (1 tbs) / 11.8 g   Choose: Olive oil (1 tbs) / 1.8 g   Choose: Corn oil (1 tbs) / 1.7 g   Choose: Safflower oil (1 tbs) / 1.2 g   Choose: Sunflower oil (1 tbs) / 1.4 g   Choose: Soybean oil (1 tbs) / 2.4 g   Choose: Canola oil (1 tbs) / 1 g  Document Released: 08/11/2005 Document Revised: 04/23/2011 Document Reviewed: 01/30/2011 [facility] Patient Information 2012 [place], [place].  Exercise to Lose Weight Exercise and a healthy diet may help you lose weight. Your doctor may suggest specific exercises. EXERCISE IDEAS AND TIPS  Choose low-cost things you enjoy doing, such as walking, bicycling, or exercising to workout videos.   Take stairs instead of the elevator.   Walk during your lunch break.   Park your car further away from work or school.   Go to a gym or an exercise class.   Start with 5 to 10 minutes of exercise each day. Build up to 30 minutes of exercise 4 to 6 days a week.   Wear shoes with good support and comfortable  clothes.  Stretch before and after working out.   Work out until you breathe harder and your heart beats faster.   Drink extra water when you exercise.   Do not do so much that you hurt yourself, feel dizzy, or get very short of breath.  Exercises that burn about 150 calories:  Running 1  miles in 15 minutes.   Playing volleyball for 45 to 60 minutes.   Washing and waxing a car for 45 to 60 minutes.   Playing touch football for 45 minutes.   Walking 1  miles in 35 minutes.   Pushing a stroller 1  miles in 30 minutes.   Playing basketball for 30 minutes.   Raking leaves for 30 minutes.   Bicycling 5 miles in 30 minutes.   Walking 2 miles in 30 minutes.   Dancing for 30 minutes.   Shoveling snow for 15 minutes.   Swimming laps for 20 minutes.   Walking up stairs for 15 minutes.   Bicycling 4 miles in 15 minutes.   Gardening for 30 to 45 minutes.   Jumping rope for 15 minutes.   Washing windows or floors for 45 to 60 minutes.  Document Released: 09/13/2010 Document Revised: 04/23/2011 Document Reviewed: 09/13/2010 [facility] Patient Information 2012 [place], [place].   If you smoke, find out from your caregiver how to quit. If you do not use tobacco, do not start.   If you are pregnant, do not drink alcohol. If you are breastfeeding, be very cautious about drinking alcohol. If you are not pregnant and choose to drink alcohol, do not exceed 1 drink per day. One drink is considered to be 12 ounces (355 mL) of beer, 5 ounces (148 mL) of wine, or 1.5 ounces (44 mL) of liquor.   Avoid use of street drugs. Do not share needles with anyone. Ask for help if you need support or instructions about stopping the use of drugs.   High blood pressure causes heart disease and increases the risk of stroke. Blood pressure should be checked at least every 1 to 2 years. Ongoing high blood pressure should be treated with medicines, if weight loss and exercise are not effective.   If  you are 23 to 41 years old, ask your caregiver if you should take aspirin to prevent strokes.   Diabetes screening involves taking a blood sample to check your fasting blood sugar level. This should be done once every 3 years, after age 11, if you are within normal weight and without risk factors for diabetes. Testing should be considered at a younger age or be carried out more frequently if you are overweight and have at least 1 risk factor for diabetes.   Breast cancer screening is essential preventative care for women. You should practice "breast self-awareness." This means understanding the normal appearance and feel of your breasts and may include breast self-examination. Any changes detected, no matter how small, should be reported to a caregiver. Women in their 21s and 30s should have a clinical breast exam (CBE) by a caregiver as part of a regular health exam every 1 to 3 years. After age 56, women should have a CBE every year. Starting at age 58, women should consider having a mammogram (breast X-ray) every year. Women who have a family history of breast cancer should talk to their caregiver about genetic screening. Women at a high risk of breast cancer should talk to their caregiver about having an MRI and a mammogram every year.   The  Pap test is a screening test for cervical cancer. Women should have a Pap test starting at age 27. Between ages 57 and 74, Pap tests should be repeated every 2 years. Beginning at age 64, you should have a Pap test every 3 years as long as the past 3 Pap tests have been normal. If you had a hysterectomy for a problem that was not cancer or a condition that could lead to cancer, then you no longer need Pap tests. If you are between ages 13 and 81, and you have had normal Pap tests going back 10 years, you no longer need Pap tests. If you have had past treatment for cervical cancer or a condition that could lead to cancer, you need Pap tests and screening for cancer for  at least 20 years after your treatment. If Pap tests have been discontinued, risk factors (such as a new sexual partner) need to be reassessed to determine if screening should be resumed. Some women have medical problems that increase the chance of getting cervical cancer. In these cases, your caregiver may recommend more frequent screening and Pap tests.   The human papillomavirus (HPV) test is an additional test that may be used for cervical cancer screening. The HPV test looks for the virus that can cause the cell changes on the cervix. The cells collected during the Pap test can be tested for HPV. The HPV test could be used to screen women aged 22 years and older, and should be used in women of any age who have unclear Pap test results. After the age of 76, women should have HPV testing at the same frequency as a Pap test.   Colorectal cancer can be detected and often prevented. Most routine colorectal cancer screening begins at the age of 42 and continues through age 83. However, your caregiver may recommend screening at an earlier age if you have risk factors for colon cancer. On a yearly basis, your caregiver may provide home test kits to check for hidden blood in the stool. Use of a small camera at the end of a tube, to directly examine the colon (sigmoidoscopy or colonoscopy), can detect the earliest forms of colorectal cancer. Talk to your caregiver about this at age 33, when routine screening begins. Direct examination of the colon should be repeated every 5 to 10 years through age 68, unless early forms of pre-cancerous polyps or small growths are found.   Hepatitis C blood testing is recommended for all people born from [birth year] through 1965 and any individual with known risks for hepatitis C.   Practice safe sex. Use condoms and avoid high-risk sexual practices to reduce the spread of sexually transmitted infections (STIs). Sexually active women aged 51 and younger should be checked for Chlamydia,  which is a common sexually transmitted infection. Older women with new or multiple partners should also be tested for Chlamydia. Testing for other STIs is recommended if you are sexually active and at increased risk.   Osteoporosis is a disease in which the bones lose minerals and strength with aging. This can result in serious bone fractures. The risk of osteoporosis can be identified using a bone density scan. Women ages 41 and over and women at risk for fractures or osteoporosis should discuss screening with their caregivers. Ask your caregiver whether you should be taking a calcium supplement or vitamin D to reduce the rate of osteoporosis.   Menopause can be associated with physical symptoms and risks. Hormone replacement therapy is  available to decrease symptoms and risks. You should talk to your caregiver about whether hormone replacement therapy is right for you.   Use sunscreen with a sun protection factor (SPF) of 30 or greater. Apply sunscreen liberally and repeatedly throughout the day. You should seek shade when your shadow is shorter than you. Protect yourself by wearing long sleeves, pants, a wide-brimmed hat, and sunglasses year round, whenever you are outdoors.   Notify your caregiver of new moles or changes in moles, especially if there is a change in shape or color. Also notify your caregiver if a mole is larger than the size of a pencil eraser.   Stay current with your immunizations.  Document Released: 02/24/2011 Document Revised: 07/31/2011 Document Reviewed: 02/24/2011 [facility] Patient Information 2012 [place], [place].

## 2012-04-02 NOTE — Progress Notes (Signed)
[name] 1970-11-08 [id number]   History:    41 y.o.  for annual gyn exam who stated that for the past month she had felt some sensitivity of the breast around the nipple area. She denied any nipple discharge or any mass per se. She had a normal mammogram in 2007. Patient no family history of breast cancer. 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 She had been followed by the endocrinologist Dr. Lurene Shadow. Patient only had 2 visits and has not followed up. Patient's currently on no medication. Her other main concern has been her weight gain. Review of her record indicates she was weighing 180s down to 176 and she has had a previous tubal sterilization procedure. Her mother had history of thyroid cancer in her grandmother with history of Alzheimer's disease. Patient's thyroid function tests were normal in 2012. And her last Pap smear was normal 2011 as well.  Past medical history,surgical history, family history and social history were all reviewed and documented in the EPIC chart.  Gynecologic History Patient's last menstrual period was 03/19/2012. Contraception: tubal ligation Last Pap: 2011. Results were: normal Last mammogram: 2007. Results were: normal  Obstetric History OB History    Grav Para Term Preterm Abortions TAB SAB Ect Mult Living   2 2 1 1      2      # Outc Date GA Lbr Len/2nd Wgt Sex Del Anes PTL Lv   1 TRM     M CS  No Yes   2 PRE     F CS  Yes Yes       ROS: A ROS was performed and pertinent positives and negatives are included in the history.  GENERAL: No fevers or chills. HEENT: No change in vision, no earache, sore throat or sinus congestion. NECK: No pain or stiffness. CARDIOVASCULAR: No chest pain or pressure. No palpitations. PULMONARY: No shortness of breath, cough or wheeze. GASTROINTESTINAL: No abdominal pain, nausea, vomiting or diarrhea, melena or bright red  blood per rectum. GENITOURINARY: No urinary frequency, urgency, hesitancy or dysuria. MUSCULOSKELETAL: No joint or muscle pain, no back pain, no recent trauma. DERMATOLOGIC: No rash, no itching, no lesions. ENDOCRINE: No polyuria, polydipsia, no heat or cold intolerance. No recent change in weight. HEMATOLOGICAL: No anemia or easy bruising or bleeding. NEUROLOGIC: No headache, seizures, numbness, tingling or weakness. PSYCHIATRIC: No depression, no loss of interest in normal activity or change in sleep pattern.     Exam: chaperone present  BP 124/80  Ht 5' 6.25" (1.683 m)  Wt 174 lb (78.926 kg)  BMI 27.87 kg/m2  LMP 03/19/2012  Body mass index is 27.87 kg/(m^2).  General appearance : Well developed well nourished female. No acute distress HEENT: Neck supple, trachea midline, no carotid bruits, no thyroidmegaly Lungs: Clear to auscultation, no rhonchi or wheezes, or rib retractions  Heart: Regular rate and rhythm, no murmurs or gallops Breast:Examined in sitting and supine position were symmetrical in appearance, no palpable masses or tenderness,  no skin retraction, no nipple inversion, no nipple discharge, no skin discoloration, no axillary or supraclavicular lymphadenopathy Abdomen: no palpable masses or tenderness, no rebound or guarding Extremities: no edema or skin discoloration or tenderness  Pelvic:  Bartholin, Urethra, Skene Glands: Within normal limits             Vagina: No gross lesions or discharge  Cervix: No gross lesions or discharge  Uterus  anteverted, normal size, shape and consistency, non-tender and mobile  Adnexa  Without masses or tenderness  Anus and perineum  normal   Rectovaginal  normal sphincter tone without palpated masses or tenderness             Hemoccult not done     Assessment/Plan:  41 y.o. female for annual exam with prior history thyrotoxicosis currently on no medication. Because of patient's weight gain and family history of diabetes she will  return back next week in a fasting states that we can check her fasting blood sugar along with a fasting lipid profile as well as a full thyroid panel because of her medical history as well as a CBC and urinalysis. She'll be provided with literature information on diet and weight reduction exercises. She was encouraged to continue to do her monthly self breast examination and she will schedule her mammogram within the next week. Will notify her there is any abnormality of any the above mentioned test. 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 We did discussed the new Pap smear screening guidelines and she will not need a Pap smear until next year.    Ok Edwards MD, 12:16 PM 04/02/2012

## 2012-04-05 ENCOUNTER — Other Ambulatory Visit: Payer: BC Managed Care – PPO

## 2012-04-05 DIAGNOSIS — Z8639 Personal history of other endocrine, nutritional and metabolic disease: Secondary | ICD-10-CM

## 2012-04-05 DIAGNOSIS — R635 Abnormal weight gain: Secondary | ICD-10-CM

## 2012-04-05 DIAGNOSIS — Z01419 Encounter for gynecological examination (general) (routine) without abnormal findings: Secondary | ICD-10-CM

## 2012-04-05 DIAGNOSIS — Z833 Family history of diabetes mellitus: Secondary | ICD-10-CM

## 2012-04-05 LAB — LIPID PANEL
Cholesterol: 201 mg/dL — ABNORMAL HIGH (ref 0–200)
HDL: 62 mg/dL (ref >39)
Total CHOL/HDL Ratio: 3.2 Ratio

## 2012-04-05 LAB — CBC WITH DIFFERENTIAL/PLATELET
Hemoglobin: 13.7 g/dL (ref 12.0–15.0)
Lymphs Abs: 3.3 10*3/uL (ref 0.7–4.0)
Monocytes Relative: 10 % (ref 3–12)
Neutro Abs: 4.2 10*3/uL (ref 1.7–7.7)
Neutrophils Relative %: 48 % (ref 43–77)
RBC: 4.32 MIL/uL (ref 3.87–5.11)
WBC: 8.5 10*3/uL (ref 4.0–10.5)

## 2012-04-05 LAB — THYROID PANEL WITH TSH
T3 Uptake: 37.4 % — ABNORMAL HIGH (ref 22.5–37.0)
TSH: 0.601 u[IU]/mL (ref 0.350–4.500)

## 2012-04-05 LAB — GLUCOSE, RANDOM: Glucose, Bld: 92 mg/dL (ref 70–99)

## 2012-04-06 ENCOUNTER — Other Ambulatory Visit: Payer: Self-pay | Admitting: Gynecology

## 2012-04-06 DIAGNOSIS — E7800 Pure hypercholesterolemia, unspecified: Secondary | ICD-10-CM

## 2012-04-06 DIAGNOSIS — R946 Abnormal results of thyroid function studies: Secondary | ICD-10-CM

## 2012-04-06 LAB — URINALYSIS W MICROSCOPIC + REFLEX CULTURE
Glucose, UA: NEGATIVE mg/dL
Leukocytes, UA: NEGATIVE
Nitrite: NEGATIVE
Protein, ur: NEGATIVE mg/dL

## 2012-05-13 ENCOUNTER — Ambulatory Visit: Payer: BC Managed Care – PPO

## 2012-05-14 ENCOUNTER — Ambulatory Visit: Payer: BC Managed Care – PPO

## 2012-06-15 ENCOUNTER — Ambulatory Visit
Admission: RE | Admit: 2012-06-15 | Discharge: 2012-06-15 | Disposition: A | Discharge status: Home / Self Care | Payer: BC Managed Care – PPO | Source: Ambulatory Visit | Attending: Gynecology | Admitting: Gynecology

## 2012-06-15 DIAGNOSIS — Z1231 Encounter for screening mammogram for malignant neoplasm of breast: Secondary | ICD-10-CM

## 2013-05-18 ENCOUNTER — Ambulatory Visit (INDEPENDENT_AMBULATORY_CARE_PROVIDER_SITE_OTHER): Payer: BC Managed Care – PPO | Admitting: Gynecology

## 2013-05-18 ENCOUNTER — Encounter: Payer: Self-pay | Admitting: Gynecology

## 2013-05-18 VITALS — BP 126/82 | Ht 66.25 in | Wt 168.0 lb

## 2013-05-18 DIAGNOSIS — Z833 Family history of diabetes mellitus: Secondary | ICD-10-CM

## 2013-05-18 DIAGNOSIS — Z01419 Encounter for gynecological examination (general) (routine) without abnormal findings: Secondary | ICD-10-CM

## 2013-05-18 DIAGNOSIS — N920 Excessive and frequent menstruation with regular cycle: Secondary | ICD-10-CM

## 2013-05-18 DIAGNOSIS — N942 Vaginismus: Secondary | ICD-10-CM

## 2013-05-18 DIAGNOSIS — Z23 Encounter for immunization: Secondary | ICD-10-CM

## 2013-05-18 LAB — CHOLESTEROL, TOTAL: Cholesterol: 181 mg/dL (ref 0–200)

## 2013-05-18 LAB — CBC WITH DIFFERENTIAL/PLATELET
HCT: 40.5 % (ref 36.0–46.0)
Hemoglobin: 13.7 g/dL (ref 12.0–15.0)
Lymphocytes Relative: 38 % (ref 12–46)
Lymphs Abs: 3.4 10*3/uL (ref 0.7–4.0)
MCHC: 33.8 g/dL (ref 30.0–36.0)
Monocytes Absolute: 0.8 10*3/uL (ref 0.1–1.0)
Monocytes Relative: 9 % (ref 3–12)
Neutro Abs: 4.4 10*3/uL (ref 1.7–7.7)
Neutrophils Relative %: 49 % (ref 43–77)
RBC: 4.38 MIL/uL (ref 3.87–5.11)
WBC: 9 10*3/uL (ref 4.0–10.5)

## 2013-05-18 LAB — HEMOGLOBIN A1C
Hgb A1c MFr Bld: 5.6 % (ref <5.7)
Mean Plasma Glucose: 114 mg/dL (ref <117)

## 2013-05-18 MED ORDER — TRANEXAMIC ACID 650 MG PO TABS: 1300.0000 mg | ORAL_TABLET | Freq: Three times a day (TID) | ORAL | Status: DC

## 2013-05-18 NOTE — Patient Instructions (Addendum)
Influenza Vaccine (Flu Vaccine, Inactivated) 2013 2014 What You Need to Know WHY GET VACCINATED?  Influenza ("flu") is a contagious disease that spreads around the [place] every winter, usually between October and May.  Flu is caused by the influenza virus, and can be spread by coughing, sneezing, and close contact.  Anyone can get flu, but the risk of getting flu is highest among children. Symptoms come on suddenly and may last several days. They can include:  Fever or chills.  Sore throat.  Muscle aches.  Fatigue.  Cough.  Headache.  Runny or stuffy nose. Flu can make some people much sicker than others. These people include young children, people 65 and older, pregnant women, and people with certain health conditions such as heart, lung or kidney disease, or a weakened immune system. Flu vaccine is especially important for these people, and anyone in close contact with them. Flu can also lead to pneumonia, and make existing medical conditions worse. It can cause diarrhea and seizures in children. Each year thousands of people in the [place] die from flu, and many more are hospitalized. Flu vaccine is the best protection we have from flu and its complications. Flu vaccine also helps prevent spreading flu from person to person. INACTIVATED FLU VACCINE There are 2 types of influenza vaccine:  You are getting an inactivated flu vaccine, which does not contain any live influenza virus. It is given by injection with a needle, and often called the "flu shot."  A different live, attenuated (weakened) influenza vaccine is sprayed into the nostrils. This vaccine is described in a separate Vaccine Information Statement. Flu vaccine is recommended every year. Children 6 months through 8 years of age should get 2 doses the first year they get vaccinated. Flu viruses are always changing. Each year's flu vaccine is made to protect from viruses that are most likely to cause disease  that year. While flu vaccine cannot prevent all cases of flu, it is our best defense against the disease. Inactivated flu vaccine protects against 3 or 4 different influenza viruses. It takes about 2 weeks for protection to develop after the vaccination, and protection lasts several months to a year. Some illnesses that are not caused by influenza virus are often mistaken for flu. Flu vaccine will not prevent these illnesses. It can only prevent influenza. A "high-dose" flu vaccine is available for people 65 years of age and older. The person giving you the vaccine can tell you more about it. Some inactivated flu vaccine contains a very small amount of a mercury-based preservative called thimerosal. Studies have shown that thimerosal in vaccines is not harmful, but flu vaccines that do not contain a preservative are available. SOME PEOPLE SHOULD NOT GET THIS VACCINE Tell the person who gives you the vaccine:  If you have any severe (life-threatening) allergies. If you ever had a life-threatening allergic reaction after a dose of flu vaccine, or have a severe allergy to any part of this vaccine, you may be advised not to get a dose. Most, but not all, types of flu vaccine contain a small amount of egg.  If you ever had Guillain Barr Syndrome (a severe paralyzing illness, also called GBS). Some people with a history of GBS should not get this vaccine. This should be discussed with your doctor.  If you are not feeling well. They might suggest waiting until you feel better. But you should come back. RISKS OF A VACCINE REACTION With a vaccine, like any medicine, there   is a chance of side effects. These are usually mild and go away on their own. Serious side effects are also possible, but are very rare. Inactivated flu vaccine does not contain live flu virus, sogetting flu from this vaccine is not possible. Brief fainting spells and related symptoms (such as jerking movements) can happen after any medical  procedure, including vaccination. Sitting or lying down for about 15 minutes after a vaccination can help prevent fainting and injuries caused by falls. Tell your doctor if you feel dizzy or lightheaded, or have vision changes or ringing in the ears. Mild problems following inactivated flu vaccine:  Soreness, redness, or swelling where the shot was given.  Hoarseness; sore, red or itchy eyes; or cough.  Fever.  Aches.  Headache.  Itching.  Fatigue. If these problems occur, they usually begin soon after the shot and last 1 or 2 days. Moderate problems following inactivated flu vaccine:  Young children who get inactivated flu vaccine and pneumococcal vaccine (PCV13) at the same time may be at increased risk for seizures caused by fever. Ask your doctor for more information. Tell your doctor if a child who is getting flu vaccine has ever had a seizure. Severe problems following inactivated flu vaccine:  A severe allergic reaction could occur after any vaccine (estimated less than 1 in a million doses).  There is a small possibility that inactivated flu vaccine could be associated with Guillan Barr Syndrome (GBS), no more than 1 or 2 cases per million people vaccinated. This is much lower than the risk of severe complications from flu, which can be prevented by flu vaccine. The safety of vaccines is always being monitored. For more information, visit: www.cdc.gov/vaccinesafety/ WHAT IF THERE IS A SERIOUS REACTION? What should I look for?  Look for anything that concerns you, such as signs of a severe allergic reaction, very high fever, or behavior changes. Signs of a severe allergic reaction can include hives, swelling of the face and throat, difficulty breathing, a fast heartbeat, dizziness, and weakness. These would start a few minutes to a few hours after the vaccination. What should I do?  If you think it is a severe allergic reaction or other emergency that cannot wait, call [phone] 1 1  or get the person to the nearest hospital. Otherwise, call your doctor.  Afterward, the reaction should be reported to the Vaccine Adverse Event Reporting System (VAERS). Your doctor might file this report, or you can do it yourself through the VAERS website at www.vaers.hhs.gov, or by calling 1-800-822-7967. VAERS is only for reporting reactions. They do not give medical advice. THE NATIONAL VACCINE INJURY COMPENSATION PROGRAM The National Vaccine Injury Compensation Program (VICP) is a federal program that was created to compensate people who may have been injured by certain vaccines. Persons who believe they may have been injured by a vaccine can learn about the program and about filing a claim by calling 1-800-338-2382 or visiting the VICP website at www.hrsa.gov/vaccinecompensation HOW CAN I LEARN MORE?  Ask your doctor.  Call your local or state health department.  Contact the Centers for Disease Control and Prevention (CDC):  Call 1-800-232-4636 (1-800-CDC-INFO) or  Visit CDC's website at www.cdc.gov/flu CDC Inactivated Influenza Vaccine Interim VIS (03/19/12) Document Released: 06/05/2006 Document Revised: 05/05/2012 Document Reviewed: 03/19/2012 [employer] Patient Information 2014 [employer]. Tetanus, Diphtheria, Pertussis (Tdap) Vaccine What You Need to Know WHY GET VACCINATED? Tetanus, diphtheria and pertussis can be very serious diseases, even for adolescents and adults. Tdap vaccine can protect us   from these diseases. TETANUS (Lockjaw) causes painful muscle tightening and stiffness, usually all over the body.  It can lead to tightening of muscles in the head and neck so you can't open your mouth, swallow, or sometimes even breathe. Tetanus kills about 1 out of 5 people who are infected. DIPHTHERIA can cause a thick coating to form in the back of the throat.  It can lead to breathing problems, paralysis, heart failure, and death. PERTUSSIS (Whooping Cough) causes severe  coughing spells, which can cause difficulty breathing, vomiting and disturbed sleep.  It can also lead to weight loss, incontinence, and rib fractures. Up to 2 in 100 adolescents and 5 in 100 adults with pertussis are hospitalized or have complications, which could include pneumonia and death. These diseases are caused by bacteria. Diphtheria and pertussis are spread from person to person through coughing or sneezing. Tetanus enters the body through cuts, scratches, or wounds. Before vaccines, the [place] saw as many as 200,000 cases a year of diphtheria and pertussis, and hundreds of cases of tetanus. Since vaccination began, tetanus and diphtheria have dropped by about 99% and pertussis by about 80%. TDAP VACCINE Tdap vaccine can protect adolescents and adults from tetanus, diphtheria, and pertussis. One dose of Tdap is routinely given at age 11 or 12. People who did not get Tdap at that age should get it as soon as possible. Tdap is especially important for health care professionals and anyone having close contact with a baby younger than 12 months. Pregnant women should get a dose of Tdap during every pregnancy, to protect the newborn from pertussis. Infants are most at risk for severe, life-threatening complications from pertussis. A similar vaccine, called Td, protects from tetanus and diphtheria, but not pertussis. A Td booster should be given every 10 years. Tdap may be given as one of these boosters if you have not already gotten a dose. Tdap may also be given after a severe cut or burn to prevent tetanus infection. Your doctor can give you more information. Tdap may safely be given at the same time as other vaccines. SOME PEOPLE SHOULD NOT GET THIS VACCINE  If you ever had a life-threatening allergic reaction after a dose of any tetanus, diphtheria, or pertussis containing vaccine, OR if you have a severe allergy to any part of this vaccine, you should not get Tdap. Tell your doctor if  you have any severe allergies.  If you had a coma, or long or multiple seizures within 7 days after a childhood dose of DTP or DTaP, you should not get Tdap, unless a cause other than the vaccine was found. You can still get Td.  Talk to your doctor if you:  have epilepsy or another nervous system problem,  had severe pain or swelling after any vaccine containing diphtheria, tetanus or pertussis,  ever had Guillain-Barr Syndrome (GBS),  aren't feeling well on the day the shot is scheduled. RISKS OF A VACCINE REACTION With any medicine, including vaccines, there is a chance of side effects. These are usually mild and go away on their own, but serious reactions are also possible. Brief fainting spells can follow a vaccination, leading to injuries from falling. Sitting or lying down for about 15 minutes can help prevent these. Tell your doctor if you feel dizzy or light-headed, or have vision changes or ringing in the ears. Mild problems following Tdap (Did not interfere with activities)  Pain where the shot was given (about 3 in 4 adolescents or 2   in 3 adults)  Redness or swelling where the shot was given (about 1 person in 5)  Mild fever of at least 100.4F (up to about 1 in 25 adolescents or 1 in 100 adults)  Headache (about 3 or 4 people in 10)  Tiredness (about 1 person in 3 or 4)  Nausea, vomiting, diarrhea, stomach ache (up to 1 in 4 adolescents or 1 in 10 adults)  Chills, body aches, sore joints, rash, swollen glands (uncommon) Moderate problems following Tdap (Interfered with activities, but did not require medical attention)  Pain where the shot was given (about 1 in 5 adolescents or 1 in 100 adults)  Redness or swelling where the shot was given (up to about 1 in 16 adolescents or 1 in 25 adults)  Fever over 102F (about 1 in 100 adolescents or 1 in 250 adults)  Headache (about 3 in 20 adolescents or 1 in 10 adults)  Nausea, vomiting, diarrhea, stomach ache (up to 1  or 3 people in 100)  Swelling of the entire arm where the shot was given (up to about 3 in 100). Severe problems following Tdap (Unable to perform usual activities, required medical attention)  Swelling, severe pain, bleeding and redness in the arm where the shot was given (rare). A severe allergic reaction could occur after any vaccine (estimated less than 1 in a million doses). WHAT IF THERE IS A SERIOUS REACTION? What should I look for?  Look for anything that concerns you, such as signs of a severe allergic reaction, very high fever, or behavior changes. Signs of a severe allergic reaction can include hives, swelling of the face and throat, difficulty breathing, a fast heartbeat, dizziness, and weakness. These would start a few minutes to a few hours after the vaccination. What should I do?  If you think it is a severe allergic reaction or other emergency that can't wait, call 9-1-1 or get the person to the nearest hospital. Otherwise, call your doctor.  Afterward, the reaction should be reported to the "Vaccine Adverse Event Reporting System" (VAERS). Your doctor might file this report, or you can do it yourself through the VAERS web site at www.vaers.hhs.gov, or by calling 1-800-822-7967. VAERS is only for reporting reactions. They do not give medical advice.  THE NATIONAL VACCINE INJURY COMPENSATION PROGRAM The National Vaccine Injury Compensation Program (VICP) is a federal program that was created to compensate people who may have been injured by certain vaccines. Persons who believe they may have been injured by a vaccine can learn about the program and about filing a claim by calling 1-800-338-2382 or visiting the VICP website at www.hrsa.gov/vaccinecompensation. HOW CAN I LEARN MORE?  Ask your doctor.  Call your local or state health department.  Contact the Centers for Disease Control and Prevention (CDC):  Call 1-800-232-4636 or visit CDC's website at  www.cdc.gov/vaccines. CDC Tdap Vaccine VIS (01/01/12) Document Released: 02/10/2012 Document Revised: 05/05/2012 Document Reviewed: 02/10/2012 [employer] Patient Information 2014 [employer].  

## 2013-05-18 NOTE — Progress Notes (Signed)
[name] Dec 27, 1970 [id number]   History:    42 y.o.  for annual gyn exam With no complaints today. Patient does suffer from menorrhagia for which she had previously been prescribed Lysteda which has helped. Patient with prior tubal sterilization procedure. Patient otherwise having normal menstrual cycles. Patient with no previous history of abnormal Pap smears. 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 She had been followed by the endocrinologist Dr. Lurene Shadow. Patient only had 2 visits and has not followed up. Patient's currently on no medication.Her mother had history of thyroid cancer in her grandmother with history of Alzheimer's disease. Patient had normal thyroid function tests last year.    Past medical history,surgical history, family history and social history were all reviewed and documented in the EPIC chart.  Gynecologic History Patient's last menstrual period was 05/10/2013. Contraception: tubal ligation Last Pap: 2012. Results were: normal Last mammogram: 2013. Results were: normal  Obstetric History OB History  Gravida Para Term Preterm AB SAB TAB Ectopic Multiple Living  2 2 1 1      2     # Outcome Date GA Lbr Len/2nd Weight Sex Delivery Anes PTL Lv  2 PRE     F CS  Y Y  1 TRM     M CS  N Y       ROS: A ROS was performed and pertinent positives and negatives are included in the history.  GENERAL: No fevers or chills. HEENT: No change in vision, no earache, sore throat or sinus congestion. NECK: No pain or stiffness. CARDIOVASCULAR: No chest pain or pressure. No palpitations. PULMONARY: No shortness of breath, cough or wheeze. GASTROINTESTINAL: No abdominal pain, nausea, vomiting or diarrhea, melena or bright red blood per rectum. GENITOURINARY: No urinary frequency, urgency, hesitancy or dysuria. MUSCULOSKELETAL: No joint or muscle pain, no back pain, no recent  trauma. DERMATOLOGIC: No rash, no itching, no lesions. ENDOCRINE: No polyuria, polydipsia, no heat or cold intolerance. No recent change in weight. HEMATOLOGICAL: No anemia or easy bruising or bleeding. NEUROLOGIC: No headache, seizures, numbness, tingling or weakness. PSYCHIATRIC: No depression, no loss of interest in normal activity or change in sleep pattern.     Exam: chaperone present  BP 126/82  Ht 5' 6.25" (1.683 m)  Wt 168 lb (76.204 kg)  BMI 26.9 kg/m2  LMP 05/10/2013  Body mass index is 26.9 kg/(m^2).  General appearance : Well developed well nourished female. No acute distress HEENT: Neck supple, trachea midline, no carotid bruits, no thyroidmegaly Lungs: Clear to auscultation, no rhonchi or wheezes, or rib retractions  Heart: Regular rate and rhythm, no murmurs or gallops Breast:Examined in sitting and supine position were symmetrical in appearance, no palpable masses or tenderness,  no skin retraction, no nipple inversion, no nipple discharge, no skin discoloration, no axillary or supraclavicular lymphadenopathy Abdomen: no palpable masses or tenderness, no rebound or guarding Extremities: no edema or skin discoloration or tenderness  Pelvic:  Bartholin, Urethra, Skene Glands: Within normal limits             Vagina: No gross lesions or discharge  Cervix: No gross lesions or discharge  Uterus  anteverted, normal size, shape and consistency, non-tender and mobile  Adnexa  Without masses or tenderness  Anus and perineum  normal   Rectovaginal  normal sphincter tone without palpated masses or tenderness  Hemoccult none indicated     Assessment/Plan:  42 y.o. female for annual exam with past history  thyrotoxicosis and had iodine-131.patient currently on the medication has not followed up with the endocrinologist. Last ears are from her tests were normal. Today we'll check her TSH, CBC, screening cholesterol, urinalysis, hemoglobin A1c. Patient to receive the  Tdap vaccine today and will return back to the office in 1-2 weeks for a pelvic ultrasound for better assessment of her uterus and adnexa due to the difficulty today because of vaginismus. At that time patient will probably receive her flu vaccine. Patient was reminded to do her monthly self breast examination and to schedule her mammogram which is overdue next month. We discussed importance of calcium and vitamin D and regular exercise for osteoporosis prevention.  Ok Edwards MD, 4:50 PM 05/18/2013

## 2013-05-19 LAB — URINALYSIS W MICROSCOPIC + REFLEX CULTURE
Bacteria, UA: NONE SEEN
Bilirubin Urine: NEGATIVE
Casts: NONE SEEN
Crystals: NONE SEEN
Ketones, ur: NEGATIVE mg/dL
Leukocytes, UA: NEGATIVE
Nitrite: NEGATIVE
Specific Gravity, Urine: 1.026 (ref 1.005–1.030)
Urobilinogen, UA: 1 mg/dL (ref 0.0–1.0)
pH: 6.5 (ref 5.0–8.0)

## 2013-05-19 LAB — TSH: TSH: 0.492 u[IU]/mL (ref 0.350–4.500)

## 2013-05-23 ENCOUNTER — Ambulatory Visit (INDEPENDENT_AMBULATORY_CARE_PROVIDER_SITE_OTHER): Payer: BC Managed Care – PPO

## 2013-05-23 ENCOUNTER — Ambulatory Visit (INDEPENDENT_AMBULATORY_CARE_PROVIDER_SITE_OTHER): Payer: BC Managed Care – PPO | Admitting: Gynecology

## 2013-05-23 ENCOUNTER — Other Ambulatory Visit: Payer: Self-pay | Admitting: Gynecology

## 2013-05-23 DIAGNOSIS — N831 Corpus luteum cyst of ovary, unspecified side: Secondary | ICD-10-CM

## 2013-05-23 DIAGNOSIS — Z23 Encounter for immunization: Secondary | ICD-10-CM

## 2013-05-23 DIAGNOSIS — D259 Leiomyoma of uterus, unspecified: Secondary | ICD-10-CM

## 2013-05-23 DIAGNOSIS — N920 Excessive and frequent menstruation with regular cycle: Secondary | ICD-10-CM

## 2013-05-23 DIAGNOSIS — D251 Intramural leiomyoma of uterus: Secondary | ICD-10-CM

## 2013-05-23 DIAGNOSIS — T7840XS Allergy, unspecified, sequela: Secondary | ICD-10-CM

## 2013-05-23 DIAGNOSIS — R102 Pelvic and perineal pain: Secondary | ICD-10-CM

## 2013-05-23 DIAGNOSIS — N949 Unspecified condition associated with female genital organs and menstrual cycle: Secondary | ICD-10-CM

## 2013-05-23 DIAGNOSIS — D252 Subserosal leiomyoma of uterus: Secondary | ICD-10-CM

## 2013-05-23 DIAGNOSIS — N942 Vaginismus: Secondary | ICD-10-CM

## 2013-05-23 MED ORDER — EPINEPHRINE 0.3 MG/0.3ML IJ SOAJ: 0.3000 mg | Freq: Once | INTRAMUSCULAR | Status: DC

## 2013-05-23 NOTE — Patient Instructions (Addendum)
Epinephrine injection (Auto-injector) What is this medicine? EPINEPHRINE (ep i NEF rin) is used for the emergency treatment of severe allergic reactions. You should keep this medicine with you at all times. This medicine may be used for other purposes; ask your health care provider or pharmacist if you have questions. What should I tell my health care provider before I take this medicine? They need to know if you have any of the following conditions: -an unusual or allergic reaction to epinephrine, sulfites, other medicines, foods, dyes, or preservatives -pregnant or trying to get pregnant -breast-feeding How should I use this medicine? This medicine is for injection into the outer thigh. Your doctor or health care professional will instruct you on the proper use of the device during an emergency. Read all directions carefully and make sure you understand them. Do not use more often than directed. Talk to your pediatrician regarding the use of this medicine in children. Special care may be needed. This drug is commonly used in children. A special device is available for use in children. Overdosage: If you think you have taken too much of this medicine contact a poison control center or emergency room at once. NOTE: This medicine is only for you. Do not share this medicine with others. What if I miss a dose? This does not apply. You should only use this medicine for an allergic reaction. What may interact with this medicine? This medicine is only used during an emergency. Significant drug interactions are not likely during emergency use. This list may not describe all possible interactions. Give your health care provider a list of all the medicines, herbs, non-prescription drugs, or dietary supplements you use. Also tell them if you smoke, drink alcohol, or use illegal drugs. Some items may interact with your medicine. What should I watch for while using this medicine? Keep this medicine ready for  use in the case of a severe allergic reaction. Make sure that you have the phone number of your doctor or health care professional and local hospital ready. Remember to check the expiration date of your medicine regularly. You may need to have additional units of this medicine with you at work, school, or other places. Talk to your doctor or health care professional about your need for extra units. Some emergencies may require an additional dose. Check with your doctor or a health care professional before using an extra dose. After use, go to the nearest hospital or call 911. Avoid physical activity. Make sure the treating health care professional knows you have received an injection of this medicine. You will receive additional instructions on what to do during and after use of this medicine before a medical emergency occurs. What side effects may I notice from receiving this medicine? Side effects that you should report to your doctor or health care professional as soon as possible: -allergic reactions like skin rash, itching or hives, swelling of the face, lips, or tongue -breathing problems -chest pain -flushing -irregular or pounding heartbeat -numbness in fingers or toes -vomiting Side effects that usually do not require medical attention (report to your doctor or health care professional if they continue or are bothersome): -anxiety or nervousness -dizzy, drowsy -dry mouth -headache -increased sweating -nausea -tired, weak This list may not describe all possible side effects. Call your doctor for medical advice about side effects. You may report side effects to FDA at 1-800-FDA-1088. Where should I keep my medicine? Keep out of the reach of children. Store at room temperature between   15 and 30 degrees C (59 and 86 degrees F). Protect from light and heat. The solution should be clear in color. If the solution is discolored or contains particles it must be replaced. Throw away any unused  medicine after the expiration date. Ask your doctor or pharmacist about proper disposal of the injector if it is expired or has been used. Always replace your auto-injector before it expires. NOTE: This sheet is a summary. It may not cover all possible information. If you have questions about this medicine, talk to your doctor, pharmacist, or health care provider.  2013, Elsevier/Gold Standard. (12/14/2007 4:32:55 PM)  Endometrial Ablation Endometrial ablation removes the lining of the uterus (endometrium). It is usually a same day, outpatient treatment. Ablation helps avoid major surgery (such as a hysterectomy). A hysterectomy is removal of the cervix and uterus. Endometrial ablation has less risk and complications, has a shorter recovery period and is less expensive. After endometrial ablation, most women will have little or no menstrual bleeding. You may not keep your fertility. Pregnancy is no longer likely after this procedure but if you are pre-menopausal, you still need to use a reliable method of birth control following the procedure because pregnancy can occur. REASONS TO HAVE THE PROCEDURE MAY INCLUDE:  Heavy periods.  Bleeding that is causing anemia.  Anovulatory bleeding, very irregular, bleeding.  Bleeding submucous fibroids (on the lining inside the uterus) if they are smaller than 3 centimeters. REASONS NOT TO HAVE THE PROCEDURE MAY INCLUDE:  You wish to have more children.  You have a pre-cancerous or cancerous problem. The cause of any abnormal bleeding must be diagnosed before having the procedure.  You have pain coming from the uterus.  You have a submucus fibroid larger than 3 centimeters.  You recently had a baby.  You recently had an infection in the uterus.  You have a severe retro-flexed, tipped uterus and cannot insert the instrument to do the ablation.  You had a Cesarean section or deep major surgery on the uterus.  The inner cavity of the uterus is too  large for the endometrial ablation instrument. RISKS AND COMPLICATIONS   Perforation of the uterus.  Bleeding.  Infection of the uterus, bladder or vagina.  Injury to surrounding organs.  Cutting the cervix.  An air bubble to the lung (air embolus).  Pregnancy following the procedure.  Failure of the procedure to help the problem requiring hysterectomy.  Decreased ability to diagnose cancer in the lining of the uterus. BEFORE THE PROCEDURE  The lining of the uterus must be tested to make sure there is no pre-cancerous or cancer cells present.  Medications may be given to make the lining of the uterus thinner.  Ultrasound may be used to evaluate the size and look for abnormalities of the uterus.  Future pregnancy is not desired. PROCEDURE  There are different ways to destroy the lining of the uterus.   Resectoscope - radio frequency-alternating electric current is the most common one used.  Cryotherapy - freezing the lining of the uterus.  Heated Free Liquid - heated salt (saline) solution inserted into the uterus.  Microwave - uses high energy microwaves in the uterus.  Thermal Balloon - a catheter with a balloon tip is inserted into the uterus and filled with heated fluid. Your caregiver will talk with you about the method used in this clinic. They will also instruct you on the pros and cons of the procedure. Endometrial ablation is performed along with a procedure called  operative hysteroscopy. A narrow viewing tube is inserted through the birth canal (vagina) and through the cervix into the uterus. A tiny camera attached to the viewing tube (hysteroscope) allows the uterine cavity to be shown on a TV monitor during surgery. Your uterus is filled with a harmless liquid to make the procedure easier. The lining of the uterus is then removed. The lining can also be removed with a resectoscope which allows your surgeon to cut away the lining of the uterus under direct vision.  Usually, you will be able to go home within an hour after the procedure. HOME CARE INSTRUCTIONS   Do not drive for 24 hours.  No tampons, douching or intercourse for 2 weeks or until your caregiver approves.  Rest at home for 24 to 48 hours. You may then resume normal activities unless told differently by your caregiver.  Take your temperature two times a day for 4 days, and record it.  Take any medications your caregiver has ordered, as directed.  Use some form of contraception if you are pre-menopausal and do not want to get pregnant. Bleeding after the procedure is normal. It varies from light spotting and mildly watery to bloody discharge for 4 to 6 weeks. You may also have mild cramping. Only take over-the-counter or prescription medicines for pain, discomfort, or fever as directed by your caregiver. Do not use aspirin, as this may aggravate bleeding. Frequent urination during the first 24 hours is normal. You will not know how effective your surgery is until at least 3 months after the surgery. SEEK IMMEDIATE MEDICAL CARE IF:   Bleeding is heavier than a normal menstrual cycle.  An oral temperature above 102 F (38.9 C) develops.  You have increasing cramps or pains not relieved with medication or develop belly (abdominal) pain which does not seem to be related to the same area of earlier cramping and pain.  You are light headed, weak or have fainting episodes.  You develop pain in the shoulder strap areas.  You have chest or leg pain.  You have abnormal vaginal discharge.  You have painful urination. Document Released: 06/20/2004 Document Revised: 11/03/2011 Document Reviewed: 09/18/2007 [facility] Patient Information 2014 [place], [place].  Influenza Vaccine (Flu Vaccine, Inactivated) 2013 2014 What You Need to Know WHY GET VACCINATED?  Influenza ("flu") is a contagious disease that spreads around the [place] every winter, usually between October and May.  Flu is  caused by the influenza virus, and can be spread by coughing, sneezing, and close contact.  Anyone can get flu, but the risk of getting flu is highest among children. Symptoms come on suddenly and may last several days. They can include:  Fever or chills.  Sore throat.  Muscle aches.  Fatigue.  Cough.  Headache.  Runny or stuffy nose. Flu can make some people much sicker than others. These people include young children, people 64 and older, pregnant women, and people with certain health conditions such as heart, lung or kidney disease, or a weakened immune system. Flu vaccine is especially important for these people, and anyone in close contact with them. Flu can also lead to pneumonia, and make existing medical conditions worse. It can cause diarrhea and seizures in children. Each year thousands of people in the [place] die from flu, and many more are hospitalized. Flu vaccine is the best protection we have from flu and its complications. Flu vaccine also helps prevent spreading flu from person to person. INACTIVATED FLU VACCINE There are 2  types of influenza vaccine:  You are getting an inactivated flu vaccine, which does not contain any live influenza virus. It is given by injection with a needle, and often called the "flu shot."  A different live, attenuated (weakened) influenza vaccine is sprayed into the nostrils. This vaccine is described in a separate Vaccine Information Statement. Flu vaccine is recommended every year. Children 6 months through 64 years of age should get 2 doses the first year they get vaccinated. Flu viruses are always changing. Each year's flu vaccine is made to protect from viruses that are most likely to cause disease that year. While flu vaccine cannot prevent all cases of flu, it is our best defense against the disease. Inactivated flu vaccine protects against 3 or 4 different influenza viruses. It takes about 2 weeks for protection to develop after  the vaccination, and protection lasts several months to a year. Some illnesses that are not caused by influenza virus are often mistaken for flu. Flu vaccine will not prevent these illnesses. It can only prevent influenza. A "high-dose" flu vaccine is available for people 26 years of age and older. The person giving you the vaccine can tell you more about it. Some inactivated flu vaccine contains a very small amount of a mercury-based preservative called thimerosal. Studies have shown that thimerosal in vaccines is not harmful, but flu vaccines that do not contain a preservative are available. SOME PEOPLE SHOULD NOT GET THIS VACCINE Tell the person who gives you the vaccine:  If you have any severe (life-threatening) allergies. If you ever had a life-threatening allergic reaction after a dose of flu vaccine, or have a severe allergy to any part of this vaccine, you may be advised not to get a dose. Most, but not all, types of flu vaccine contain a small amount of egg.  If you ever had Guillain Barr Syndrome (a severe paralyzing illness, also called GBS). Some people with a history of GBS should not get this vaccine. This should be discussed with your doctor.  If you are not feeling well. They might suggest waiting until you feel better. But you should come back. RISKS OF A VACCINE REACTION With a vaccine, like any medicine, there is a chance of side effects. These are usually mild and go away on their own. Serious side effects are also possible, but are very rare. Inactivated flu vaccine does not contain live flu virus, sogetting flu from this vaccine is not possible. Brief fainting spells and related symptoms (such as jerking movements) can happen after any medical procedure, including vaccination. Sitting or lying down for about 15 minutes after a vaccination can help prevent fainting and injuries caused by falls. Tell your doctor if you feel dizzy or lightheaded, or have vision changes or ringing  in the ears. Mild problems following inactivated flu vaccine:  Soreness, redness, or swelling where the shot was given.  Hoarseness; sore, red or itchy eyes; or cough.  Fever.  Aches.  Headache.  Itching.  Fatigue. If these problems occur, they usually begin soon after the shot and last 1 or 2 days. Moderate problems following inactivated flu vaccine:  Young children who get inactivated flu vaccine and pneumococcal vaccine (PCV13) at the same time may be at increased risk for seizures caused by fever. Ask your doctor for more information. Tell your doctor if a child who is getting flu vaccine has ever had a seizure. Severe problems following inactivated flu vaccine:  A severe allergic reaction could occur after any  vaccine (estimated less than 1 in a million doses).  There is a small possibility that inactivated flu vaccine could be associated with Guillan Barr Syndrome (GBS), no more than 1 or 2 cases per million people vaccinated. This is much lower than the risk of severe complications from flu, which can be prevented by flu vaccine. The safety of vaccines is always being monitored. For more information, visit: http://floyd.org/ WHAT IF THERE IS A SERIOUS REACTION? What should I look for?  Look for anything that concerns you, such as signs of a severe allergic reaction, very high fever, or behavior changes. Signs of a severe allergic reaction can include hives, swelling of the face and throat, difficulty breathing, a fast heartbeat, dizziness, and weakness. These would start a few minutes to a few hours after the vaccination. What should I do?  If you think it is a severe allergic reaction or other emergency that cannot wait, call [phone] 1 1  or get the person to the nearest hospital. Otherwise, call your doctor.  Afterward, the reaction should be reported to the Vaccine Adverse Event Reporting System (VAERS). Your doctor might file this report, or you can do it yourself  through the VAERS website at www.vaers.LAgents.no, or by calling 1-(434) 148-8851. VAERS is only for reporting reactions. They do not give medical advice. THE NATIONAL VACCINE INJURY COMPENSATION PROGRAM The National Vaccine Injury Compensation Program (VICP) is a federal program that was created to compensate people who may have been injured by certain vaccines. Persons who believe they may have been injured by a vaccine can learn about the program and about filing a claim by calling 1-(725)431-6494 or visiting the VICP website at SpiritualWord.at HOW CAN I LEARN MORE?  Ask your doctor.  Call your local or state health department.  Contact the Centers for Disease Control and Prevention (CDC):  Call 6178011928 (1-800-CDC-INFO) or  Visit CDC's website at BiotechRoom.com.cy CDC Inactivated Influenza Vaccine Interim VIS (03/19/12) Document Released: 06/05/2006 Document Revised: 05/05/2012 Document Reviewed: 03/19/2012 [facility] [place] Patient Information 2014 [place], [place].

## 2013-05-23 NOTE — Progress Notes (Signed)
Patient presented to the office today for a sonohysterogram as a result her complaining of her worsening menorrhagia which are heavy with passage of large clots and lasting anywhere between 7-10 days. Patient also had vaginismus at time of exam so this is another reason for the ultrasound as well. She occasionally have some right or left lower quadrant discomfort appearing the time of the month. Patient had used Lysteda in the past to cut down the amount of bleeding during her cycle. Patient has had a previous tubal sterilization procedure in the past.  Recent labs have consisted of TSH, screen cholesterol, CBC, urinalysis and hemoglobin A1c all normal. Patient's Pap smears have been normal and up to date.  Ultrasound today: Uterus measures 4.7 x 5.9 x 4.9 cm with an endometrial stripe of 14.1 mm. Patient had one intramural and sub-serous myoma measuring 20 x 17 mm and one measuring 15 mm. There was a right ovary corpus luteum cyst measuring 24 x 23 mm with positive color flow the periphery. W normal. There was some fluid in the cul-de-sac. After instilling normal saline into the uterine cavity there was no evidence of any defect noted.  Patient had informed me that several weeks ago while at the close to developed an allergic reaction biting shellfish. We used Hibiclens to prep her cervix before endometrial biopsy. Patient was counseled before the endometrial biopsy. A sterile Pipelle was introduced into the uterine cavity and obtain tissue which was submitted for histological evaluation.  Assessment/plan: Patient with worsening menorrhagia previous tubal sterilization procedure. Small corpus luteum cyst on the right no abnormalities noted in the uterus. Sonohysterogram. Patient was provided with literature information on her option endometrial ablation as well as on the Mirena IUD. Will check insurance coverage and scheduled accordingly. Details of the procedures were discussed today. Patient requests and  have her flu shot today. 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

## 2013-05-23 NOTE — Addendum Note (Signed)
Addended by: Bertram Savin A on: 05/23/2013 12:26 PM   Modules accepted: Orders

## 2013-05-24 ENCOUNTER — Telehealth: Payer: Self-pay

## 2013-05-24 ENCOUNTER — Other Ambulatory Visit: Payer: Self-pay | Admitting: Gynecology

## 2013-05-24 DIAGNOSIS — N920 Excessive and frequent menstruation with regular cycle: Secondary | ICD-10-CM

## 2013-05-24 NOTE — Telephone Encounter (Signed)
I called patient to give her the ins benefits for Mirena and Her Option ablation. Ablation is a $15 copymt then ins pays 100% and ins pays 100% of Mirena. Patient said she wants ablation.  Her LMP was 05/10/13 and she anticipates her next one around week of 10/13 and it lasts 5 days. I scheduled her procedure for Friday Oct 22 9:00am.   She was advised to call Day one of menses and I will call Prometrium in for her to start on Day 3 and take daily until procedure. Pre-op appt was scheduled the day before at 4:20 and I explained regarding laminary insertion at that visit.  She was advised regarding need to have someone to drive her to and from surgery and that she will need a full bladder upon arrival for procedure.  She was mailed a sheet with all her dates on it and full bladder instructions.

## 2013-06-03 ENCOUNTER — Other Ambulatory Visit: Payer: Self-pay | Admitting: Gynecology

## 2013-06-03 ENCOUNTER — Telehealth: Payer: Self-pay

## 2013-06-03 MED ORDER — PROGESTERONE MICRONIZED 200 MG PO CAPS: 200.0000 mg | ORAL_CAPSULE | Freq: Every day | ORAL | Status: DC

## 2013-06-03 NOTE — Telephone Encounter (Signed)
Patient advised not to take Lysteda this cycle since she will be using Prometrium.

## 2013-06-03 NOTE — Telephone Encounter (Signed)
Patient called. Today is Day one of menses.  Rx called in for Prometrium. Pt instructed to begin on Day 3 of menses and take hs until day of surgery.

## 2013-06-03 NOTE — Telephone Encounter (Signed)
Lysteda should not be used with other hormones. So the answer is no.

## 2013-06-03 NOTE — Telephone Encounter (Signed)
Menses began today. Is going to start her Prometrium 200mg  daily on Sunday evening/Day 3 of cycle until Her Option Ablation on 10.31.14.  She questioned if okay to use her Lysteda this cycle with menses today?

## 2013-06-16 ENCOUNTER — Ambulatory Visit: Payer: BC Managed Care – PPO | Admitting: Gynecology

## 2013-06-17 ENCOUNTER — Other Ambulatory Visit: Payer: BC Managed Care – PPO

## 2013-06-17 ENCOUNTER — Ambulatory Visit: Payer: BC Managed Care – PPO | Admitting: Gynecology

## 2013-06-23 ENCOUNTER — Ambulatory Visit (INDEPENDENT_AMBULATORY_CARE_PROVIDER_SITE_OTHER): Payer: BC Managed Care – PPO | Admitting: Gynecology

## 2013-06-23 ENCOUNTER — Ambulatory Visit: Payer: BC Managed Care – PPO | Admitting: Gynecology

## 2013-06-23 ENCOUNTER — Telehealth: Payer: Self-pay

## 2013-06-23 ENCOUNTER — Encounter: Payer: Self-pay | Admitting: Gynecology

## 2013-06-23 VITALS — BP 126/78

## 2013-06-23 DIAGNOSIS — N946 Dysmenorrhea, unspecified: Secondary | ICD-10-CM

## 2013-06-23 DIAGNOSIS — Z01818 Encounter for other preprocedural examination: Secondary | ICD-10-CM

## 2013-06-23 DIAGNOSIS — N920 Excessive and frequent menstruation with regular cycle: Secondary | ICD-10-CM

## 2013-06-23 MED ORDER — DIAZEPAM 5 MG PO TABS: 5.0000 mg | ORAL_TABLET | Freq: Four times a day (QID) | ORAL | Status: DC | PRN

## 2013-06-23 MED ORDER — AZITHROMYCIN 500 MG PO TABS: ORAL_TABLET | ORAL | Status: DC

## 2013-06-23 MED ORDER — MISOPROSTOL 200 MCG PO TABS: ORAL_TABLET | ORAL | Status: DC

## 2013-06-23 MED ORDER — HYDROCODONE-ACETAMINOPHEN 5-300 MG PO TABS: 5.0000 mg | ORAL_TABLET | Freq: Four times a day (QID) | ORAL | Status: DC | PRN

## 2013-06-23 NOTE — Patient Instructions (Signed)
Endometrial Ablation Endometrial ablation removes the lining of the uterus (endometrium). It is usually a same day, outpatient treatment. Ablation helps avoid major surgery (such as a hysterectomy). A hysterectomy is removal of the cervix and uterus. Endometrial ablation has less risk and complications, has a shorter recovery period and is less expensive. After endometrial ablation, most women will have little or no menstrual bleeding. You may not keep your fertility. Pregnancy is no longer likely after this procedure but if you are pre-menopausal, you still need to use a reliable method of birth control following the procedure because pregnancy can occur. REASONS TO HAVE THE PROCEDURE MAY INCLUDE:  Heavy periods.  Bleeding that is causing anemia.  Anovulatory bleeding, very irregular, bleeding.  Bleeding submucous fibroids (on the lining inside the uterus) if they are smaller than 3 centimeters. REASONS NOT TO HAVE THE PROCEDURE MAY INCLUDE:  You wish to have more children.  You have a pre-cancerous or cancerous problem. The cause of any abnormal bleeding must be diagnosed before having the procedure.  You have pain coming from the uterus.  You have a submucus fibroid larger than 3 centimeters.  You recently had a baby.  You recently had an infection in the uterus.  You have a severe retro-flexed, tipped uterus and cannot insert the instrument to do the ablation.  You had a Cesarean section or deep major surgery on the uterus.  The inner cavity of the uterus is too large for the endometrial ablation instrument. RISKS AND COMPLICATIONS   Perforation of the uterus.  Bleeding.  Infection of the uterus, bladder or vagina.  Injury to surrounding organs.  Cutting the cervix.  An air bubble to the lung (air embolus).  Pregnancy following the procedure.  Failure of the procedure to help the problem requiring hysterectomy.  Decreased ability to diagnose cancer in the lining of  the uterus. BEFORE THE PROCEDURE  The lining of the uterus must be tested to make sure there is no pre-cancerous or cancer cells present.  Medications may be given to make the lining of the uterus thinner.  Ultrasound may be used to evaluate the size and look for abnormalities of the uterus.  Future pregnancy is not desired. PROCEDURE  There are different ways to destroy the lining of the uterus.   Resectoscope - radio frequency-alternating electric current is the most common one used.  Cryotherapy - freezing the lining of the uterus.  Heated Free Liquid - heated salt (saline) solution inserted into the uterus.  Microwave - uses high energy microwaves in the uterus.  Thermal Balloon - a catheter with a balloon tip is inserted into the uterus and filled with heated fluid. Your caregiver will talk with you about the method used in this clinic. They will also instruct you on the pros and cons of the procedure. Endometrial ablation is performed along with a procedure called operative hysteroscopy. A narrow viewing tube is inserted through the birth canal (vagina) and through the cervix into the uterus. A tiny camera attached to the viewing tube (hysteroscope) allows the uterine cavity to be shown on a TV monitor during surgery. Your uterus is filled with a harmless liquid to make the procedure easier. The lining of the uterus is then removed. The lining can also be removed with a resectoscope which allows your surgeon to cut away the lining of the uterus under direct vision. Usually, you will be able to go home within an hour after the procedure. HOME CARE INSTRUCTIONS   Do   not drive for 24 hours.  No tampons, douching or intercourse for 2 weeks or until your caregiver approves.  Rest at home for 24 to 48 hours. You may then resume normal activities unless told differently by your caregiver.  Take your temperature two times a day for 4 days, and record it.  Take any medications your  caregiver has ordered, as directed.  Use some form of contraception if you are pre-menopausal and do not want to get pregnant. Bleeding after the procedure is normal. It varies from light spotting and mildly watery to bloody discharge for 4 to 6 weeks. You may also have mild cramping. Only take over-the-counter or prescription medicines for pain, discomfort, or fever as directed by your caregiver. Do not use aspirin, as this may aggravate bleeding. Frequent urination during the first 24 hours is normal. You will not know how effective your surgery is until at least 3 months after the surgery. SEEK IMMEDIATE MEDICAL CARE IF:   Bleeding is heavier than a normal menstrual cycle.  An oral temperature above 102 F (38.9 C) develops.  You have increasing cramps or pains not relieved with medication or develop belly (abdominal) pain which does not seem to be related to the same area of earlier cramping and pain.  You are light headed, weak or have fainting episodes.  You develop pain in the shoulder strap areas.  You have chest or leg pain.  You have abnormal vaginal discharge.  You have painful urination. Document Released: 06/20/2004 Document Revised: 11/03/2011 Document Reviewed: 09/18/2007 [employer] Patient Information 2014 [employer].  

## 2013-06-23 NOTE — Telephone Encounter (Signed)
Patient called [name] this morning and cancelled her Her Option Ablation for tomorrow morning.  She did not want to reschedule at this time. Schedules opened and all schedulers were made aware.

## 2013-06-23 NOTE — Progress Notes (Signed)
Patient presented to the office for preoperative examination. Patient scheduled tomorrow for endometrial ablation via her option technique. Patient was seen in the office September 29 whereby she underwent a sonohysterogram as a result of her worsening menorrhagia whereby she would bleed for 7-10 days with passage of large clots. Her labs are consistent TSH, screening cholesterol, CBC, urinalysis and hemoglobin A 1C and Pap smear which all were normal.  Patient's last endometrial biopsy in September were as follows: Endometrium, biopsy - INTERVAL PHASE ENDOMETRIUM (LATE PROLIFERATIVE-EARLY SECRETORY). - THERE IS NO EVIDENCE OF HYPERPLASIA OR MALIGNANCY.  Her sono hysterogram demonstrated the following: Uterus measures 4.7 x 5.9 x 4.9 cm with an endometrial stripe of 14.1 mm. Patient had one intramural and sub-serous myoma measuring 20 x 17 mm and one measuring 15 mm. There was a right ovary corpus luteum cyst measuring 24 x 23 mm with positive color flow the periphery. W normal. There was some fluid in the cul-de-sac. After instilling normal saline into the uterine cavity there was no evidence of any defect noted.  Exam: HEENT unremarkable Neck supple trachea midline or carotid bruits Lungs: Clear to auscultation or rubs or wheezes Heart: Regular rate and rhythm no murmurs or gallops Abdomen: Soft nontender no rebound or guarding Pelvic exam: The urethra Skene was within normal limits Vagina: No lesions or discharge Cervix: No lesion or discharge Uterus anteverted upper limits of normal no palpable mass or tenderness Adnexa: No palpable mass or tenderness Rectal exam: Not done  Assessment/plan: Patient with worsening menorrhagia scheduled for endometrial ablation tomorrow. Preprocedural instructions were provided. Patient take Cytotec 200 mg tablets tonight. She was given Valium 5 mg to take upper sleep tonight. Tomorrow morning she'll take Zithromax 500 mg 1 by mouth in the morning along with  the Vicodin. She will repeat the Zithromax in 24 hours. Consent form was signed the risks benefits and pros and cons of the operation were discussed. A laminaria was placed intracervically to help dilate the cervical canal to facilitate insertion of the cryoprobe tomorrow.

## 2013-06-24 ENCOUNTER — Ambulatory Visit: Payer: BC Managed Care – PPO | Admitting: Gynecology

## 2013-06-24 ENCOUNTER — Ambulatory Visit (INDEPENDENT_AMBULATORY_CARE_PROVIDER_SITE_OTHER): Payer: BC Managed Care – PPO

## 2013-06-24 ENCOUNTER — Other Ambulatory Visit: Payer: BC Managed Care – PPO

## 2013-06-24 ENCOUNTER — Ambulatory Visit (INDEPENDENT_AMBULATORY_CARE_PROVIDER_SITE_OTHER): Payer: BC Managed Care – PPO | Admitting: Gynecology

## 2013-06-24 VITALS — BP 118/74 | HR 72

## 2013-06-24 VITALS — BP 124/76 | HR 70

## 2013-06-24 DIAGNOSIS — N920 Excessive and frequent menstruation with regular cycle: Secondary | ICD-10-CM

## 2013-06-24 MED ORDER — KETOROLAC TROMETHAMINE 60 MG/2ML IM SOLN
60.0000 mg | Freq: Once | INTRAMUSCULAR | Status: AC
  Administered 2013-06-24: 60 mg via INTRAMUSCULAR

## 2013-06-24 MED ORDER — LIDOCAINE HCL (PF) 1 % IJ SOLN
15.0000 mL | Freq: Once | INTRAMUSCULAR | Status: AC
  Administered 2013-06-24: 15 mL

## 2013-06-24 NOTE — Progress Notes (Signed)
Patient presented to the office today for endometrial ablation via her option technique is a result of her menorrhagia. Procedure note as follows:  Patient Name:[name]  Patient MRN: [id number]   Date:06/24/2013   Diagnosis:  Excessive Uterine Bleeding/Menorrhagia  Procedure:  Endometrial cryoablation with intraoperative ultrasonic guidance  Procedure Medications: Toradol 60 mg IM. 1% lidocaine paracervical block 10 cc   Procedure:  The Patient was brought to the treatment room having previously been counseled for the procedure position and a speculum was inserted.  The cervix and upper vagina were cleaned with Betadine.  A single tooth tenaculum was placed on the anterior lip of the cervix.  A paracervical block was placed per above.  The uterus was sounded 7-1/2cm.  Cervical dilation was not performed.  Under ultrasound guidance, the Her Option probe was introduced into the uterine cavity after the pre procedural sequence was performed.  After assuring proper cornual placement, Cryoablation was then performed under continuous ultrasound guidance monitoring the growth of the cryozone.  Sequential cryoablation were performed in the following order, locations, freeze times and post freeze myometrial depths.           Location of Freeze Length of Time Myometrial Depth 1. Right side    5 Minutes  6 mm 2. Left side    5 minutesMinutes  8 mmmm 3. none    noneMinutes  nonemm 4. none    noneMinutes  nonemm  Upon completion of the procedure, the instruments were removed, hemostasis visualized and the patient was assisted to the bathroom and then another exam room where she was observed.  Vitals:   Pre treatment:  Time:9 AM  BP:124/76  P: 70 Post Treatment:  Time: 920  BP:118/74   P: 70  The patient tolerated the procedure well and was released in stable condition with her driver along with a copy of the post procedure instruments which were reviewed with her.  She is to return to the  office in 2 weeks for a post procedural check.  [facility] HMD11:45 AMTD@

## 2013-06-27 NOTE — Telephone Encounter (Signed)
We discovered later in the day that this patient had not called and cancelled that it was another patient and Debarah Crape misunderstood the name.  Patient came for her laminary appt.

## 2013-07-01 ENCOUNTER — Telehealth: Payer: Self-pay | Admitting: *Deleted

## 2013-07-01 NOTE — Telephone Encounter (Signed)
Pt informed with the below note. 

## 2013-07-01 NOTE — Telephone Encounter (Signed)
(  JF patient) pt had endometrial ablation on 06/24/13 pt has been having some bleeding & passing small clots, changing pads every 3 hours. Pt said bleeding similar to period flow. Doing well other than bleeding, if pain occurs patient takes pain medication. Pt asked about how long should she expect bleeding? Please advise

## 2013-07-01 NOTE — Telephone Encounter (Signed)
Should be starting to let up over the weekend. If continues through next week and recommend office visit with JF.

## 2013-07-04 ENCOUNTER — Ambulatory Visit: Payer: BC Managed Care – PPO | Admitting: Gynecology

## 2013-07-11 ENCOUNTER — Ambulatory Visit (INDEPENDENT_AMBULATORY_CARE_PROVIDER_SITE_OTHER): Payer: BC Managed Care – PPO | Admitting: Gynecology

## 2013-07-11 ENCOUNTER — Ambulatory Visit: Payer: BC Managed Care – PPO | Admitting: Gynecology

## 2013-07-11 ENCOUNTER — Encounter: Payer: Self-pay | Admitting: Gynecology

## 2013-07-11 VITALS — BP 124/78

## 2013-07-11 DIAGNOSIS — Z09 Encounter for follow-up examination after completed treatment for conditions other than malignant neoplasm: Secondary | ICD-10-CM

## 2013-07-11 MED ORDER — METRONIDAZOLE 0.75 % VA GEL: VAGINAL | Status: DC

## 2013-07-11 NOTE — Progress Notes (Signed)
Patient presented to the office today for her two-week postop visit. Patient status post her option endometrial ablation. She is doing well she had her first cycle over a week ago but none since she is having no bleeding and her watery discharge has decreased. She does feel like she may have a fishy vaginal odor.  Exam: Bartholin urethra Skene glands within normal limits Vagina: No lesions or discharge Cervix: No lesions or discharge Uterus: Anteverted normal size shape and consistency Adnexa: Without mass or tenderness Rectal exam not done  Assessment/plan: Patient 2 weeks status post endometrial ablation via her option technique doing well. Patient still has not had her mammogram done a requisition was provided today. She was prescribed MetroGel vaginal cream to apply each bedtime for 5-7 days. Patient otherwise scheduled to return to the office next year for her annual exam or when necessary.

## 2014-04-19 ENCOUNTER — Other Ambulatory Visit: Payer: Self-pay

## 2014-04-19 DIAGNOSIS — Z1231 Encounter for screening mammogram for malignant neoplasm of breast: Secondary | ICD-10-CM

## 2014-05-05 ENCOUNTER — Ambulatory Visit: Payer: BC Managed Care – PPO

## 2014-05-09 ENCOUNTER — Ambulatory Visit
Admission: RE | Admit: 2014-05-09 | Discharge: 2014-05-09 | Disposition: A | Discharge status: Home / Self Care | Payer: BC Managed Care – PPO | Source: Ambulatory Visit

## 2014-05-09 DIAGNOSIS — Z1231 Encounter for screening mammogram for malignant neoplasm of breast: Secondary | ICD-10-CM

## 2014-05-23 ENCOUNTER — Other Ambulatory Visit (HOSPITAL_COMMUNITY)
Admission: RE | Admit: 2014-05-23 | Discharge: 2014-05-23 | Disposition: A | Discharge status: Home / Self Care | Payer: BC Managed Care – PPO | Source: Ambulatory Visit | Attending: Gynecology | Admitting: Gynecology

## 2014-05-23 ENCOUNTER — Ambulatory Visit (INDEPENDENT_AMBULATORY_CARE_PROVIDER_SITE_OTHER): Payer: BC Managed Care – PPO | Admitting: Gynecology

## 2014-05-23 ENCOUNTER — Encounter: Payer: Self-pay | Admitting: Gynecology

## 2014-05-23 VITALS — BP 128/76 | Ht 66.25 in | Wt 176.0 lb

## 2014-05-23 DIAGNOSIS — Z01419 Encounter for gynecological examination (general) (routine) without abnormal findings: Secondary | ICD-10-CM | POA: Insufficient documentation

## 2014-05-23 DIAGNOSIS — Z862 Personal history of diseases of the blood and blood-forming organs and certain disorders involving the immune mechanism: Secondary | ICD-10-CM

## 2014-05-23 DIAGNOSIS — Z8639 Personal history of other endocrine, nutritional and metabolic disease: Secondary | ICD-10-CM

## 2014-05-23 DIAGNOSIS — Z23 Encounter for immunization: Secondary | ICD-10-CM

## 2014-05-23 NOTE — Patient Instructions (Signed)

## 2014-05-23 NOTE — Addendum Note (Signed)
Addended by: Thurnell Garbe A on: 05/23/2014 04:08 PM   Modules accepted: Orders

## 2014-05-23 NOTE — Progress Notes (Signed)
[name] 1971-03-24 [id number]   History:    43 y.o.  for annual gyn exam with no complaints today. Patient with past history of endometrial ablation having normal cycles but less heavy than before.Patient with no previous history of abnormal Pap smears. .Review of her record indicated in 2010 she was diagnosed with thyrotoxicosis and had iodine-131 thyroid treatment. She had been followed by the endocrinologist Dr. Bubba Camp. Patient only had 2 visits and has not followed up. Patient's currently on no medication.Her mother had history of thyroid cancer in her grandmother with history of Alzheimer's disease. The patient had a normal TSH last year.    Past medical history,surgical history, family history and social history were all reviewed and documented in the EPIC chart.  Gynecologic History Patient's last menstrual period was 05/16/2014. Contraception: tubal ligation Last Pap: 2012. Results were: normal Last mammogram: 2015. Results were: Dense, normal had 3-dimensional mammogram  Obstetric History OB History  Gravida Para Term Preterm AB SAB TAB Ectopic Multiple Living  2 2 1 1      2     # Outcome Date GA Lbr Len/2nd Weight Sex Delivery Anes PTL Lv  2 PRE     F CS  Y Y  1 TRM     M CS  N Y       ROS: A ROS was performed and pertinent positives and negatives are included in the history.  GENERAL: No fevers or chills. HEENT: No change in vision, no earache, sore throat or sinus congestion. NECK: No pain or stiffness. CARDIOVASCULAR: No chest pain or pressure. No palpitations. PULMONARY: No shortness of breath, cough or wheeze. GASTROINTESTINAL: No abdominal pain, nausea, vomiting or diarrhea, melena or bright red blood per rectum. GENITOURINARY: No urinary frequency, urgency, hesitancy or dysuria. MUSCULOSKELETAL: No joint or muscle pain, no back pain, no recent trauma. DERMATOLOGIC: No rash, no itching, no lesions. ENDOCRINE: No polyuria, polydipsia, no heat or cold intolerance.  No recent change in weight. HEMATOLOGICAL: No anemia or easy bruising or bleeding. NEUROLOGIC: No headache, seizures, numbness, tingling or weakness. PSYCHIATRIC: No depression, no loss of interest in normal activity or change in sleep pattern.     Exam: chaperone present  BP 128/76  Ht 5' 6.25" (1.683 m)  Wt 176 lb (79.833 kg)  BMI 28.18 kg/m2  LMP 05/16/2014  Body mass index is 28.18 kg/(m^2).  General appearance : Well developed well nourished female. No acute distress HEENT: Neck supple, trachea midline, no carotid bruits, no thyroidmegaly Lungs: Clear to auscultation, no rhonchi or wheezes, or rib retractions  Heart: Regular rate and rhythm, no murmurs or gallops Breast:Examined in sitting and supine position were symmetrical in appearance, no palpable masses or tenderness,  no skin retraction, no nipple inversion, no nipple discharge, no skin discoloration, no axillary or supraclavicular lymphadenopathy Abdomen: no palpable masses or tenderness, no rebound or guarding Extremities: no edema or skin discoloration or tenderness  Pelvic:  Bartholin, Urethra, Skene Glands: Within normal limits             Vagina: No gross lesions or discharge  Cervix: No gross lesions or discharge  Uterus  retroverted, normal size, shape and consistency, non-tender and mobile  Adnexa  Without masses or tenderness  Anus and perineum  normal   Rectovaginal  normal sphincter tone without palpated masses or tenderness             Hemoccult that indicated     Assessment/Plan:  43 y.o. female for annual  exam was reminded on the importance of calcium vitamin D and regular exercise for osteoporosis prevention. Patient received the flu vaccine today and literature and information was provided. The following labs were ordered today: CBC, screening cholesterol, comprehensive metabolic panel, TSH, and urinalysis as well as Pap smear.  Note: This dictation was prepared with  Dragon/digital dictation along  withSmart phrase technology. Any transcriptional errors that result from this process are unintentional.   Terrance Mass MD, 3:46 PM 05/23/2014

## 2014-05-23 NOTE — Addendum Note (Signed)
Addended by: Burnett Kanaris on: 05/23/2014 03:53 PM   Modules accepted: Orders

## 2014-05-24 LAB — COMPREHENSIVE METABOLIC PANEL
ALBUMIN: 4.6 g/dL (ref 3.5–5.2)
ALK PHOS: 52 U/L (ref 39–117)
ALT: 17 U/L (ref 0–35)
AST: 16 U/L (ref 0–37)
BUN: 20 mg/dL (ref 6–23)
CO2: 25 mEq/L (ref 19–32)
Calcium: 9.7 mg/dL (ref 8.4–10.5)
Chloride: 102 mEq/L (ref 96–112)
Creat: 0.8 mg/dL (ref 0.50–1.10)
Glucose, Bld: 89 mg/dL (ref 70–99)
POTASSIUM: 3.7 meq/L (ref 3.5–5.3)
SODIUM: 140 meq/L (ref 135–145)
TOTAL PROTEIN: 7.3 g/dL (ref 6.0–8.3)
Total Bilirubin: 0.5 mg/dL (ref 0.2–1.2)

## 2014-05-24 LAB — URINALYSIS W MICROSCOPIC + REFLEX CULTURE
BILIRUBIN URINE: NEGATIVE
CRYSTALS: NONE SEEN
Casts: NONE SEEN
GLUCOSE, UA: NEGATIVE mg/dL
HGB URINE DIPSTICK: NEGATIVE
KETONES UR: NEGATIVE mg/dL
Leukocytes, UA: NEGATIVE
NITRITE: NEGATIVE
PH: 6 (ref 5.0–8.0)
Protein, ur: NEGATIVE mg/dL
SPECIFIC GRAVITY, URINE: 1.024 (ref 1.005–1.030)
Urobilinogen, UA: 1 mg/dL (ref 0.0–1.0)

## 2014-05-24 LAB — CBC WITH DIFFERENTIAL/PLATELET
BASOS PCT: 1 % (ref 0–1)
Basophils Absolute: 0.1 10*3/uL (ref 0.0–0.1)
Eosinophils Absolute: 0.2 10*3/uL (ref 0.0–0.7)
Eosinophils Relative: 2 % (ref 0–5)
HCT: 40.7 % (ref 36.0–46.0)
HEMOGLOBIN: 13.7 g/dL (ref 12.0–15.0)
Lymphocytes Relative: 34 % (ref 12–46)
Lymphs Abs: 3.3 10*3/uL (ref 0.7–4.0)
MCH: 31.5 pg (ref 26.0–34.0)
MCHC: 33.7 g/dL (ref 30.0–36.0)
MCV: 93.6 fL (ref 78.0–100.0)
MONOS PCT: 7 % (ref 3–12)
Monocytes Absolute: 0.7 10*3/uL (ref 0.1–1.0)
NEUTROS PCT: 56 % (ref 43–77)
Neutro Abs: 5.4 10*3/uL (ref 1.7–7.7)
PLATELETS: 289 10*3/uL (ref 150–400)
RBC: 4.35 MIL/uL (ref 3.87–5.11)
RDW: 13.3 % (ref 11.5–15.5)
WBC: 9.7 10*3/uL (ref 4.0–10.5)

## 2014-05-24 LAB — TSH: TSH: 0.464 u[IU]/mL (ref 0.350–4.500)

## 2014-05-24 LAB — CHOLESTEROL, TOTAL: CHOLESTEROL: 174 mg/dL (ref 0–200)

## 2014-05-25 LAB — URINE CULTURE: Colony Count: >=100000

## 2014-05-25 LAB — CYTOLOGY - PAP

## 2014-05-26 ENCOUNTER — Other Ambulatory Visit: Payer: Self-pay | Admitting: Gynecology

## 2014-05-26 DIAGNOSIS — R8271 Bacteriuria: Secondary | ICD-10-CM

## 2014-05-29 ENCOUNTER — Other Ambulatory Visit: Payer: BC Managed Care – PPO

## 2014-05-29 DIAGNOSIS — R8271 Bacteriuria: Secondary | ICD-10-CM

## 2014-05-29 LAB — URINALYSIS W MICROSCOPIC + REFLEX CULTURE
BACTERIA UA: NONE SEEN
CASTS: NONE SEEN
CRYSTALS: NONE SEEN
Glucose, UA: NEGATIVE mg/dL
Hgb urine dipstick: NEGATIVE
KETONES UR: NEGATIVE mg/dL
Leukocytes, UA: NEGATIVE
Nitrite: NEGATIVE
PH: 5 (ref 5.0–8.0)
Protein, ur: NEGATIVE mg/dL
Specific Gravity, Urine: 1.028 (ref 1.005–1.030)
UROBILINOGEN UA: 1 mg/dL (ref 0.0–1.0)

## 2014-06-26 ENCOUNTER — Encounter: Payer: Self-pay | Admitting: Gynecology

## 2014-10-30 ENCOUNTER — Ambulatory Visit (INDEPENDENT_AMBULATORY_CARE_PROVIDER_SITE_OTHER): Payer: BLUE CROSS/BLUE SHIELD | Admitting: Women's Health

## 2014-10-30 ENCOUNTER — Encounter: Payer: Self-pay | Admitting: Women's Health

## 2014-10-30 VITALS — BP 130/80

## 2014-10-30 DIAGNOSIS — B3731 Acute candidiasis of vulva and vagina: Secondary | ICD-10-CM

## 2014-10-30 DIAGNOSIS — B373 Candidiasis of vulva and vagina: Secondary | ICD-10-CM | POA: Diagnosis not present

## 2014-10-30 LAB — WET PREP FOR TRICH, YEAST, CLUE
Clue Cells Wet Prep HPF POC: NONE SEEN
Trich, Wet Prep: NONE SEEN

## 2014-10-30 MED ORDER — FLUCONAZOLE 100 MG PO TABS: ORAL_TABLET | ORAL | Status: DC

## 2014-10-30 NOTE — Patient Instructions (Signed)

## 2014-10-30 NOTE — Progress Notes (Signed)
Patient ID: [name], female   DOB: 1971/04/14, 44 y.o.   MRN: [id number] Presents with complaint of questionable yeast infection, tried over-the-counter Monistat with some relief but symptoms of itching with irritation have persisted. Reports minimal odor, no urinary symptoms, abdominal pain or fever. Currently being treated by dermatologist with an antibiotic for acne, symptoms started after antibiotic use. BTL/ablation 5 day monthly cycle.  Exam: Appears well. External genitalia minimal erythema, speculum exam moderate amount of a white curdy discharge without odor. Wet prep positive for yeast. Bimanual no CMT or adnexal fullness or tenderness.  Yeast vaginitis  Plan: Diflucan 100 mg 2 tablets today, repeat in 3 days if needed, 1 tablet weekly while on antibiotics for acne if needed.

## 2015-03-17 ENCOUNTER — Encounter (HOSPITAL_BASED_OUTPATIENT_CLINIC_OR_DEPARTMENT_OTHER): Payer: Self-pay | Admitting: *Deleted

## 2015-03-17 ENCOUNTER — Emergency Department (HOSPITAL_BASED_OUTPATIENT_CLINIC_OR_DEPARTMENT_OTHER)
Admission: EM | Admit: 2015-03-17 | Discharge: 2015-03-17 | Disposition: A | Discharge status: Home / Self Care | Payer: BLUE CROSS/BLUE SHIELD | Attending: Emergency Medicine | Admitting: Emergency Medicine

## 2015-03-17 DIAGNOSIS — Z8639 Personal history of other endocrine, nutritional and metabolic disease: Secondary | ICD-10-CM | POA: Diagnosis not present

## 2015-03-17 DIAGNOSIS — Z872 Personal history of diseases of the skin and subcutaneous tissue: Secondary | ICD-10-CM | POA: Insufficient documentation

## 2015-03-17 DIAGNOSIS — R21 Rash and other nonspecific skin eruption: Secondary | ICD-10-CM | POA: Diagnosis present

## 2015-03-17 DIAGNOSIS — Z792 Long term (current) use of antibiotics: Secondary | ICD-10-CM | POA: Diagnosis not present

## 2015-03-17 DIAGNOSIS — Z8742 Personal history of other diseases of the female genital tract: Secondary | ICD-10-CM | POA: Insufficient documentation

## 2015-03-17 NOTE — ED Notes (Signed)
Pt c/o ? Allergic reaction , rash to face , no resp distress noted

## 2015-03-17 NOTE — ED Provider Notes (Addendum)
CSN: [id number]     Arrival date & time 03/17/15  1218 History   First MD Initiated Contact with Patient 03/17/15 1306     Chief Complaint  Patient presents with  . Allergic Reaction     (Consider location/radiation/quality/duration/timing/severity/associated sxs/prior Treatment) HPI Patient noted pruritic rash around eyes, between her breasts and on medial thighs onset 3 days ago after removing chemicals from her mother's garage. She treated self with Benadryl and placed peroxide on rash between& at medial thighs with partial relief. She denies shortness of breath denies difficulty swallowing denieshoarseness. Denies visual complaintnoted no difficulty swallowing or difficulty breathing. No other associated symptoms. Past Medical History  Diagnosis Date  . HELLP (hemolytic anemia/elev liver enzymes/low platelets in pregnancy)     hx of two pregnancies  . Normal spontaneous vaginal delivery   . Miscarriage   . Endometriosis   . Acne   . Thyroid dysfunction    Past Surgical History  Procedure Laterality Date  . Cesarean section    . Tubal ligation     Family History  Problem Relation Age of Onset  . Diabetes Mother   . Hypertension Mother   . Thyroid disease Mother   . Cancer Mother     THYROID   History  Substance Use Topics  . Smoking status: Never Smoker   . Smokeless tobacco: Never Used  . Alcohol Use: Yes     Comment: WINE.... OCC   OB History    Gravida Para Term Preterm AB TAB SAB Ectopic Multiple Living   2 2 1 1      2      Review of Systems  Constitutional: Negative.   Eyes: Negative.   Respiratory: Negative.   Skin: Positive for rash.  All other systems reviewed and are negative.     Allergies  Iodine and Shellfish allergy  Home Medications   Prior to Admission medications   Medication Sig Start Date End Date Taking? Authorizing Provider  diphenhydrAMINE (SOMINEX) 25 MG tablet Take 50 mg by mouth at bedtime as needed for sleep.   Yes Historical  Provider, MD  ampicillin (PRINCIPEN) 500 MG capsule Take 500 mg by mouth 4 (four) times daily.    Historical Provider, MD  CALCIUM PO Take by mouth.      Historical Provider, MD  Coconut Oil 1000 MG CAPS Take by mouth.    Historical Provider, MD  EPINEPHrine (EPI-PEN) 0.3 mg/0.3 mL SOAJ injection Inject 0.3 mLs (0.3 mg total) into the muscle once. Patient not taking: Reported on 10/30/2014 05/23/13   Terrance Mass, MD  fluconazole (DIFLUCAN) 100 MG tablet Take 2 tablets today and repeat in 3 days then take weekly as needed 10/30/14   Huel Cote, NP  Magnesium Oxide (MAG-200 PO) Take by mouth.      Historical Provider, MD  Multiple Vitamins-Minerals (ZINC PO) Take by mouth.      Historical Provider, MD  tretinoin (RETIN-A) 0.05 % cream Apply topically at bedtime.    Historical Provider, MD   BP 136/81 mmHg  Pulse 82  Temp(Src) 98 F (36.7 C) (Oral)  Resp 18  Ht 5\' 7"  (1.702 m)  Wt 180 lb (81.647 kg)  BMI 28.19 kg/m2  SpO2 100% Physical Exam  Constitutional: She appears well-developed and well-nourished.  HENT:  Head: Normocephalic and atraumatic.  No mucosal lesion  Eyes: Conjunctivae are normal. Pupils are equal, round, and reactive to light.  Neck: Neck supple. No tracheal deviation present. No thyromegaly present.  Cardiovascular:  Normal rate and regular rhythm.   No murmur heard. Pulmonary/Chest: Effort normal and breath sounds normal.  Abdominal: Soft. Bowel sounds are normal. She exhibits no distension. There is no tenderness.  Musculoskeletal: Normal range of motion. She exhibits no edema or tenderness.  Neurological: She is alert. Coordination normal.  Skin: Skin is warm and dry. Rash noted.  Hive-like rash between her breasts and on medial thighs. I do not appreciate any swelling or rash on face  Psychiatric: She has a normal mood and affect.  Nursing note and vitals reviewed.   ED Course  Procedures (including critical care time) Labs Review Labs Reviewed - No data  to display  Imaging Review No results found.   EKG Interpretation None      MDM  Suspect contact dermatitis plan Benadryl, cool compresses. Referral [facility] Dx rash Final diagnoses:  None        Orlie Dakin, MD 03/17/15 [place], MD 03/17/15 1329

## 2015-03-17 NOTE — ED Notes (Signed)
Signed her discharge papers

## 2015-03-17 NOTE — ED Notes (Signed)
MD at bedside. 

## 2015-03-17 NOTE — Discharge Instructions (Signed)
Contact Dermatitis Don't place any creams or lotions on your face. Don't wear makeup until you feel back to normal. Cool compresses will help with this condition.Place a cool compress 4 times daily 30 minutes a time over the involved areas of your skin. You can take Benadryl 50 mg 4 times daily as needed for itch. Contact the [place] to get a primary care physician and to be seen if not continuing to improve in a week Contact dermatitis is a reaction to certain substances that touch the skin. Contact dermatitis can be either irritant contact dermatitis or allergic contact dermatitis. Irritant contact dermatitis does not require previous exposure to the substance for a reaction to occur.Allergic contact dermatitis only occurs if you have been exposed to the substance before. Upon a repeat exposure, your body reacts to the substance.  CAUSES  Many substances can cause contact dermatitis. Irritant dermatitis is most commonly caused by repeated exposure to mildly irritating substances, such as:  Makeup.  Soaps.  Detergents.  Bleaches.  Acids.  Metal salts, such as nickel. Allergic contact dermatitis is most commonly caused by exposure to:  Poisonous plants.  Chemicals (deodorants, shampoos).  Jewelry.  Latex.  Neomycin in triple antibiotic cream.  Preservatives in products, including clothing. SYMPTOMS  The area of skin that is exposed may develop:  Dryness or flaking.  Redness.  Cracks.  Itching.  Pain or a burning sensation.  Blisters. With allergic contact dermatitis, there may also be swelling in areas such as the eyelids, mouth, or genitals.  DIAGNOSIS  Your caregiver can usually tell what the problem is by doing a physical exam. In cases where the cause is uncertain and an allergic contact dermatitis is suspected, a patch skin test may be performed to help determine the cause of your dermatitis. TREATMENT Treatment includes protecting the  skin from further contact with the irritating substance by avoiding that substance if possible. Barrier creams, powders, and gloves may be helpful. Your caregiver may also recommend:  Steroid creams or ointments applied 2 times daily. For best results, soak the rash area in cool water for 20 minutes. Then apply the medicine. Cover the area with a plastic wrap. You can store the steroid cream in the refrigerator for a "chilly" effect on your rash. That may decrease itching. Oral steroid medicines may be needed in more severe cases.  Antibiotics or antibacterial ointments if a skin infection is present.  Antihistamine lotion or an antihistamine taken by mouth to ease itching.  Lubricants to keep moisture in your skin.  Burow's solution to reduce redness and soreness or to dry a weeping rash. Mix one packet or tablet of solution in 2 cups cool water. Dip a clean washcloth in the mixture, wring it out a bit, and put it on the affected area. Leave the cloth in place for 30 minutes. Do this as often as possible throughout the day.  Taking several cornstarch or baking soda baths daily if the area is too large to cover with a washcloth. Harsh chemicals, such as alkalis or acids, can cause skin damage that is like a burn. You should flush your skin for 15 to 20 minutes with cold water after such an exposure. You should also seek immediate medical care after exposure. Bandages (dressings), antibiotics, and pain medicine may be needed for severely irritated skin.  HOME CARE INSTRUCTIONS  Avoid the substance that caused your reaction.  Keep the area of skin that is affected away from hot water,  soap, sunlight, chemicals, acidic substances, or anything else that would irritate your skin.  Do not scratch the rash. Scratching may cause the rash to become infected.  You may take cool baths to help stop the itching.  Only take over-the-counter or prescription medicines as directed by your caregiver.  See  your caregiver for follow-up care as directed to make sure your skin is healing properly. SEEK MEDICAL CARE IF:   Your condition is not better after 3 days of treatment.  You seem to be getting worse.  You see signs of infection such as swelling, tenderness, redness, soreness, or warmth in the affected area.  You have any problems related to your medicines. Document Released: 08/08/2000 Document Revised: 11/03/2011 Document Reviewed: 01/14/2011 [facility] Patient Information 2015 [place], [place]. This information is not intended to replace advice given to you by your health care provider. Make sure you discuss any questions you have with your health care provider.

## 2015-05-25 ENCOUNTER — Encounter: Payer: Self-pay | Admitting: Gynecology

## 2015-05-25 ENCOUNTER — Ambulatory Visit (INDEPENDENT_AMBULATORY_CARE_PROVIDER_SITE_OTHER): Payer: BLUE CROSS/BLUE SHIELD | Admitting: Gynecology

## 2015-05-25 VITALS — BP 130/84 | Ht 66.25 in | Wt 182.0 lb

## 2015-05-25 DIAGNOSIS — M2550 Pain in unspecified joint: Secondary | ICD-10-CM

## 2015-05-25 DIAGNOSIS — Z01419 Encounter for gynecological examination (general) (routine) without abnormal findings: Secondary | ICD-10-CM

## 2015-05-25 DIAGNOSIS — R102 Pelvic and perineal pain: Secondary | ICD-10-CM | POA: Diagnosis not present

## 2015-05-25 DIAGNOSIS — D251 Intramural leiomyoma of uterus: Secondary | ICD-10-CM | POA: Diagnosis not present

## 2015-05-25 DIAGNOSIS — Z23 Encounter for immunization: Secondary | ICD-10-CM | POA: Diagnosis not present

## 2015-05-25 LAB — COMPREHENSIVE METABOLIC PANEL
ALBUMIN: 4.6 g/dL (ref 3.6–5.1)
ALT: 18 U/L (ref 6–29)
AST: 20 U/L (ref 10–30)
Alkaline Phosphatase: 56 U/L (ref 33–115)
BUN: 18 mg/dL (ref 7–25)
CALCIUM: 9.7 mg/dL (ref 8.6–10.2)
CHLORIDE: 103 mmol/L (ref 98–110)
CO2: 28 mmol/L (ref 20–31)
Creat: 0.78 mg/dL (ref 0.50–1.10)
GLUCOSE: 91 mg/dL (ref 65–99)
POTASSIUM: 4.2 mmol/L (ref 3.5–5.3)
Sodium: 141 mmol/L (ref 135–146)
Total Bilirubin: 0.6 mg/dL (ref 0.2–1.2)
Total Protein: 7.4 g/dL (ref 6.1–8.1)

## 2015-05-25 LAB — CBC WITH DIFFERENTIAL/PLATELET
Basophils Absolute: 0.1 10*3/uL (ref 0.0–0.1)
Basophils Relative: 1 % (ref 0–1)
Eosinophils Absolute: 0.3 10*3/uL (ref 0.0–0.7)
Eosinophils Relative: 3 % (ref 0–5)
HEMATOCRIT: 40.6 % (ref 36.0–46.0)
HEMOGLOBIN: 13.6 g/dL (ref 12.0–15.0)
LYMPHS PCT: 35 % (ref 12–46)
Lymphs Abs: 3.5 10*3/uL (ref 0.7–4.0)
MCH: 31.7 pg (ref 26.0–34.0)
MCHC: 33.5 g/dL (ref 30.0–36.0)
MCV: 94.6 fL (ref 78.0–100.0)
MONO ABS: 1 10*3/uL (ref 0.1–1.0)
MPV: 10 fL (ref 8.6–12.4)
Monocytes Relative: 10 % (ref 3–12)
Neutro Abs: 5.1 10*3/uL (ref 1.7–7.7)
Neutrophils Relative %: 51 % (ref 43–77)
Platelets: 278 10*3/uL (ref 150–400)
RBC: 4.29 MIL/uL (ref 3.87–5.11)
RDW: 13.5 % (ref 11.5–15.5)
WBC: 10 10*3/uL (ref 4.0–10.5)

## 2015-05-25 LAB — LIPID PANEL
CHOL/HDL RATIO: 3 ratio (ref <=5.0)
Cholesterol: 193 mg/dL (ref 125–200)
HDL: 64 mg/dL (ref >=46)
LDL CALC: 116 mg/dL (ref <130)
TRIGLYCERIDES: 67 mg/dL (ref <150)
VLDL: 13 mg/dL (ref <30)

## 2015-05-25 LAB — RHEUMATOID FACTOR: Rhuematoid fact SerPl-aCnc: <10 IU/mL (ref <=14)

## 2015-05-25 NOTE — Progress Notes (Signed)
[name] [birth year] [id number]   History:    44 y.o.  for annual gyn exam complaining of joint pains in her digits for the past several weeks. Patient stated that recently she had some chemical exposure and went to the emergency room and developed a rash and had been on 2 courses of prednisone which she completed approximately a week ago. Also patient's been complaining on and off left lower quadrant pains. Patient with known history of fibroid uterus in the past. 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 She had been followed by the endocrinologist Dr. Bubba Camp in the past and has not seen her in quite some time. She is currently on no medications. Her mother had history of thyroid cancer in her grandmother had a history also is disease.   Past medical history,surgical history, family history and social history were all reviewed and documented in the EPIC chart.  Gynecologic History Patient's last menstrual period was 05/15/2015. Contraception: tubal ligation Last Pap: 2015. Results were: normal Last mammogram: 2015. Results were: normal  Obstetric History OB History  Gravida Para Term Preterm AB SAB TAB Ectopic Multiple Living  2 2 1 1      2     # Outcome Date GA Lbr Len/2nd Weight Sex Delivery Anes PTL Lv  2 Preterm     F CS-Unspec  Y Y  1 Term     M CS-Unspec  N Y       ROS: A ROS was performed and pertinent positives and negatives are included in the history.  GENERAL: No fevers or chills. HEENT: No change in vision, no earache, sore throat or sinus congestion. NECK: No pain or stiffness. CARDIOVASCULAR: No chest pain or pressure. No palpitations. PULMONARY: No shortness of breath, cough or wheeze. GASTROINTESTINAL: No abdominal pain, nausea, vomiting or diarrhea, melena or bright red blood per rectum. GENITOURINARY: No urinary frequency, urgency, hesitancy or dysuria.  MUSCULOSKELETAL: No joint or muscle pain, no back pain, no recent trauma. DERMATOLOGIC: No rash, no itching, no lesions. ENDOCRINE: No polyuria, polydipsia, no heat or cold intolerance. No recent change in weight. HEMATOLOGICAL: No anemia or easy bruising or bleeding. NEUROLOGIC: No headache, seizures, numbness, tingling or weakness. PSYCHIATRIC: No depression, no loss of interest in normal activity or change in sleep pattern.     Exam: chaperone present  BP 130/84 mmHg  Ht 5' 6.25" (1.683 m)  Wt 182 lb (82.555 kg)  BMI 29.15 kg/m2  LMP 05/15/2015  Body mass index is 29.15 kg/(m^2).  General appearance : Well developed well nourished female. No acute distress HEENT: Eyes: no retinal hemorrhage or exudates,  Neck supple, trachea midline, no carotid bruits, no thyroidmegaly Lungs: Clear to auscultation, no rhonchi or wheezes, or rib retractions  Heart: Regular rate and rhythm, no murmurs or gallops Breast:Examined in sitting and supine position were symmetrical in appearance, no palpable masses or tenderness,  no skin retraction, no nipple inversion, no nipple discharge, no skin discoloration, no axillary or supraclavicular lymphadenopathy Abdomen: no palpable masses or tenderness, no rebound or guarding Extremities: no edema or skin discoloration or tenderness  Pelvic:  Bartholin, Urethra, Skene Glands: Within normal limits             Vagina: No gross lesions or discharge  Cervix: No gross lesions or discharge  Uterus  anteverted, normal size, shape and consistency, non-tender and mobile  Adnexa  some tenderness elicited in  the left lower quadrant no large masses palpated  Anus and perineum  normal   Rectovaginal  normal sphincter tone without palpated masses or tenderness             Hemoccult not indicated     Assessment/Plan:  44 y.o. female for annual exam with arthritic-like discomfort of her panel digits. We'll check her rheumatoid factor and a vitamin D level today. Also the  following screening blood work was ordered: Comprehensive metabolic panel, fasting lipid profile, TSH, CBC, and urinalysis. Pap smear not indicated this year. Flu vaccine was administered. Patient was instructed to schedule her mammogram which is overdue. We discussed importance of monthly breast exam. We discussed importance of calcium vitamin D and regular exercise for osteoporosis prevention. We'll schedule an ultrasound the next one to 2 weeks for follow-up on her fibroid uterus and her left lower quadrant discomfort.   Terrance Mass MD, 4:56 PM 05/25/2015

## 2015-05-26 LAB — URINALYSIS W MICROSCOPIC + REFLEX CULTURE
Bacteria, UA: NONE SEEN [HPF]
Bilirubin Urine: NEGATIVE
Casts: NONE SEEN [LPF]
Crystals: NONE SEEN [HPF]
Glucose, UA: NEGATIVE
Hgb urine dipstick: NEGATIVE
Ketones, ur: NEGATIVE
LEUKOCYTES UA: NEGATIVE
Nitrite: NEGATIVE
Protein, ur: NEGATIVE
RBC / HPF: NONE SEEN RBC/HPF (ref <=2)
Specific Gravity, Urine: 1.025 (ref 1.001–1.035)
Yeast: NONE SEEN [HPF]
pH: 6 (ref 5.0–8.0)

## 2015-05-26 LAB — THYROID PANEL WITH TSH
Free Thyroxine Index: 2.1 (ref 1.4–3.8)
T3 Uptake: 30 % (ref 22–35)
T4 TOTAL: 6.9 ug/dL (ref 4.5–12.0)
TSH: 0.42 u[IU]/mL (ref 0.350–4.500)

## 2015-05-26 LAB — VITAMIN D 25 HYDROXY (VIT D DEFICIENCY, FRACTURES): Vit D, 25-Hydroxy: 35 ng/mL (ref 30–100)

## 2015-05-27 LAB — URINE CULTURE

## 2015-06-14 ENCOUNTER — Encounter: Payer: Self-pay | Admitting: Gynecology

## 2015-06-14 ENCOUNTER — Ambulatory Visit (INDEPENDENT_AMBULATORY_CARE_PROVIDER_SITE_OTHER): Payer: BLUE CROSS/BLUE SHIELD | Admitting: Gynecology

## 2015-06-14 ENCOUNTER — Other Ambulatory Visit: Payer: Self-pay | Admitting: Gynecology

## 2015-06-14 ENCOUNTER — Ambulatory Visit (INDEPENDENT_AMBULATORY_CARE_PROVIDER_SITE_OTHER): Payer: BLUE CROSS/BLUE SHIELD

## 2015-06-14 VITALS — BP 126/84

## 2015-06-14 DIAGNOSIS — R102 Pelvic and perineal pain: Secondary | ICD-10-CM | POA: Diagnosis not present

## 2015-06-14 DIAGNOSIS — N83202 Unspecified ovarian cyst, left side: Secondary | ICD-10-CM

## 2015-06-14 DIAGNOSIS — D251 Intramural leiomyoma of uterus: Secondary | ICD-10-CM | POA: Diagnosis not present

## 2015-06-14 MED ORDER — FLUCONAZOLE 150 MG PO TABS: ORAL_TABLET | ORAL | Status: DC

## 2015-06-14 NOTE — Progress Notes (Signed)
   44 year old presented to the office today to discuss her ultrasound. She was seen in the office for her annual exam on September 30 and then been complaining at that time of low abdominal discomfort. Patient with past history of fibroid uterus. Patient was asymptomatic today. Patient's had a previous tubal ligation procedure.  Ultrasound today: Uterus measured 8.3 x 6.6 x 5.6 cm with endometrial stripe of 6.9 mm (last picture. 06/07/2015). Patient with 3 intramural fibroids the largest one measuring 29 x 26 mm. Right ovary was located behind the uterine wall but normal in appearance. A thin-walled left ovarian cyst which was echo-free was seen negative color flow. A 5 x 4 cm cluster of 3 solid calcified foci at the wall of the cyst was noted. No fluid in the cul-de-sac  Ultrasound was compared with 2014 and has demonstrated that her uterus has slightly increased in size as well as the fibroids. This calcified area was not seen before. For this reason we will follow-up with an ultrasound in 3 months and obtain a CA 125 today. Pictures were shared with the patient. And literature information was provided. Patient is otherwise asymptomatic today.  Assessment/plan: Fibroid uterus slightly increased in size from 2014. Small left ovarian cyst with calcified area. CA-125 today. Patient to return back in 3 months for follow-up ultrasound.

## 2015-06-14 NOTE — Patient Instructions (Signed)
Uterine Fibroids Uterine fibroids are tissue masses (tumors) that can develop in the womb (uterus). They are also called leiomyomas. This type of tumor is not cancerous (benign) and does not spread to other parts of the body outside of the pelvic area, which is between the hip bones. Occasionally, fibroids may develop in the fallopian tubes, in the cervix, or on the support structures (ligaments) that surround the uterus. You can have one or many fibroids. Fibroids can vary in size, weight, and where they grow in the uterus. Some can become quite large. Most fibroids do not require medical treatment. CAUSES A fibroid can develop when a single uterine cell keeps growing (replicating). Most cells in the human body have a control mechanism that keeps them from replicating without control. SIGNS AND SYMPTOMS Symptoms may include:   Heavy bleeding during your period.  Bleeding or spotting between periods.  Pelvic pain and pressure.  Bladder problems, such as needing to urinate more often (urinary frequency) or urgently.  Inability to reproduce offspring (infertility).  Miscarriages. DIAGNOSIS Uterine fibroids are diagnosed through a physical exam. Your health care provider may feel the lumpy tumors during a pelvic exam. Ultrasonography and an MRI may be done to determine the size, location, and number of fibroids. TREATMENT Treatment may include:  Watchful waiting. This involves getting the fibroid checked by your health care provider to see if it grows or shrinks. Follow your health care provider's recommendations for how often to have this checked.  Hormone medicines. These can be taken by mouth or given through an intrauterine device (IUD).  Surgery.  Removing the fibroids (myomectomy) or the uterus (hysterectomy).  Removing blood supply to the fibroids (uterine artery embolization). If fibroids interfere with your fertility and you want to become pregnant, your health care provider  may recommend having the fibroids removed.  HOME CARE INSTRUCTIONS  Keep all follow-up visits as directed by your health care provider. This is important.  Take medicines only as directed by your health care provider.  If you were prescribed a hormone treatment, take the hormone medicines exactly as directed.  Do not take aspirin, because it can cause bleeding.  Ask your health care provider about taking iron pills and increasing the amount of dark green, leafy vegetables in your diet. These actions can help to boost your blood iron levels, which may be affected by heavy menstrual bleeding.  Pay close attention to your period and tell your health care provider about any changes, such as:  Increased blood flow that requires you to use more pads or tampons than usual per month.  A change in the number of days that your period lasts per month.  A change in symptoms that are associated with your period, such as abdominal cramping or back pain. SEEK MEDICAL CARE IF:  You have pelvic pain, back pain, or abdominal cramps that cannot be controlled with medicines.  You have an increase in bleeding between and during periods.  You soak tampons or pads in a half hour or less.  You feel lightheaded, extra tired, or weak. SEEK IMMEDIATE MEDICAL CARE IF:  You faint.  You have a sudden increase in pelvic pain.   This information is not intended to replace advice given to you by your health care provider. Make sure you discuss any questions you have with your health care provider.   Document Released: 08/08/2000 Document Revised: 09/01/2014 Document Reviewed: 02/07/2014 Elsevier Interactive Patient Education 2016 [employer] Ovarian Cyst An ovarian cyst is a  fluid-filled sac that forms on an ovary. The ovaries are small organs that produce eggs in women. Various types of cysts can form on the ovaries. Most are not cancerous. Many do not cause problems, and they often go away on their own.  Some may cause symptoms and require treatment. Common types of ovarian cysts include:  Functional cysts--These cysts may occur every month during the menstrual cycle. This is normal. The cysts usually go away with the next menstrual cycle if the woman does not get pregnant. Usually, there are no symptoms with a functional cyst.  Endometrioma cysts--These cysts form from the tissue that lines the uterus. They are also called "chocolate cysts" because they become filled with blood that turns brown. This type of cyst can cause pain in the lower abdomen during intercourse and with your menstrual period.  Cystadenoma cysts--This type develops from the cells on the outside of the ovary. These cysts can get very big and cause lower abdomen pain and pain with intercourse. This type of cyst can twist on itself, cut off its blood supply, and cause severe pain. It can also easily rupture and cause a lot of pain.  Dermoid cysts--This type of cyst is sometimes found in both ovaries. These cysts may contain different kinds of body tissue, such as skin, teeth, hair, or cartilage. They usually do not cause symptoms unless they get very big.  Theca lutein cysts--These cysts occur when too much of a certain hormone (human chorionic gonadotropin) is produced and overstimulates the ovaries to produce an egg. This is most common after procedures used to assist with the conception of a baby (in vitro fertilization). CAUSES   Fertility drugs can cause a condition in which multiple large cysts are formed on the ovaries. This is called ovarian hyperstimulation syndrome.  A condition called polycystic ovary syndrome can cause hormonal imbalances that can lead to nonfunctional ovarian cysts. SIGNS AND SYMPTOMS  Many ovarian cysts do not cause symptoms. If symptoms are present, they may include:  Pelvic pain or pressure.  Pain in the lower abdomen.  Pain during sexual intercourse.  Increasing girth (swelling) of the  abdomen.  Abnormal menstrual periods.  Increasing pain with menstrual periods.  Stopping having menstrual periods without being pregnant. DIAGNOSIS  These cysts are commonly found during a routine or annual pelvic exam. Tests may be ordered to find out more about the cyst. These tests may include:  Ultrasound.  X-ray of the pelvis.  CT scan.  MRI.  Blood tests. TREATMENT  Many ovarian cysts go away on their own without treatment. Your health care provider may want to check your cyst regularly for 2-3 months to see if it changes. For women in menopause, it is particularly important to monitor a cyst closely because of the higher rate of ovarian cancer in menopausal women. When treatment is needed, it may include any of the following:  A procedure to drain the cyst (aspiration). This may be done using a long needle and ultrasound. It can also be done through a laparoscopic procedure. This involves using a thin, lighted tube with a tiny camera on the end (laparoscope) inserted through a small incision.  Surgery to remove the whole cyst. This may be done using laparoscopic surgery or an open surgery involving a larger incision in the lower abdomen.  Hormone treatment or birth control pills. These methods are sometimes used to help dissolve a cyst. HOME CARE INSTRUCTIONS   Only take over-the-counter or prescription medicines as directed by  your health care provider.  Follow up with your health care provider as directed.  Get regular pelvic exams and Pap tests. SEEK MEDICAL CARE IF:   Your periods are late, irregular, or painful, or they stop.  Your pelvic pain or abdominal pain does not go away.  Your abdomen becomes larger or swollen.  You have pressure on your bladder or trouble emptying your bladder completely.  You have pain during sexual intercourse.  You have feelings of fullness, pressure, or discomfort in your stomach.  You lose weight for no apparent reason.  You  feel generally ill.  You become constipated.  You lose your appetite.  You develop acne.  You have an increase in body and facial hair.  You are gaining weight, without changing your exercise and eating habits.  You think you are pregnant. SEEK IMMEDIATE MEDICAL CARE IF:   You have increasing abdominal pain.  You feel sick to your stomach (nauseous), and you throw up (vomit).  You develop a fever that comes on suddenly.  You have abdominal pain during a bowel movement.  Your menstrual periods become heavier than usual. MAKE SURE YOU:  Understand these instructions.  Will watch your condition.  Will get help right away if you are not doing well or get worse.   This information is not intended to replace advice given to you by your health care provider. Make sure you discuss any questions you have with your health care provider.   Document Released: 08/11/2005 Document Revised: 08/16/2013 Document Reviewed: 04/18/2013 Elsevier Interactive Patient Education [employer].

## 2015-06-15 LAB — CA 125: CA 125: 9 U/mL (ref <35)

## 2015-09-14 ENCOUNTER — Ambulatory Visit (INDEPENDENT_AMBULATORY_CARE_PROVIDER_SITE_OTHER): Payer: BLUE CROSS/BLUE SHIELD | Admitting: Gynecology

## 2015-09-14 ENCOUNTER — Ambulatory Visit (INDEPENDENT_AMBULATORY_CARE_PROVIDER_SITE_OTHER): Payer: BLUE CROSS/BLUE SHIELD

## 2015-09-14 ENCOUNTER — Encounter: Payer: Self-pay | Admitting: Gynecology

## 2015-09-14 VITALS — BP 128/86

## 2015-09-14 DIAGNOSIS — D251 Intramural leiomyoma of uterus: Secondary | ICD-10-CM | POA: Diagnosis not present

## 2015-09-14 DIAGNOSIS — N941 Unspecified dyspareunia: Secondary | ICD-10-CM | POA: Insufficient documentation

## 2015-09-14 DIAGNOSIS — N920 Excessive and frequent menstruation with regular cycle: Secondary | ICD-10-CM | POA: Diagnosis not present

## 2015-09-14 DIAGNOSIS — R102 Pelvic and perineal pain: Secondary | ICD-10-CM | POA: Diagnosis not present

## 2015-09-14 DIAGNOSIS — N83202 Unspecified ovarian cyst, left side: Secondary | ICD-10-CM

## 2015-09-14 DIAGNOSIS — Z8742 Personal history of other diseases of the female genital tract: Secondary | ICD-10-CM | POA: Diagnosis not present

## 2015-09-14 NOTE — Progress Notes (Addendum)
   Patient is a 45 year old gravida 3 para 2 (1 normal spontaneous vaginal delivery one cesarean section, 1 D&C for miscarriage) with long-standing history of heavy cycles, dysmenorrhea, dyspareunia, and left lower quadrant pelvic pain. Patient with past history of endometriosis. Patient has had history of endometrial ablation in the past. Patient is here for follow-up ultrasound and a persistent left ovarian cyst and to monitor her fibroid uterus. Today's ultrasound demonstrated the following:   Ultrasound 06/14/2015: Uterus measured 8.3 x 6.6 x 5.6 cm with endometrial stripe of 6.9 mm (last picture. 06/07/2015). Patient with 3 intramural fibroids the largest one measuring 29 x 26 mm. Right ovary was located behind the uterine wall but normal in appearance. A thin-walled left ovarian cyst which was echo-free was seen negative color flow. A 5 x 4 cm cluster of 3 solid calcified foci at the wall of the cyst was noted. No fluid in the cul-de-sac  CA 125 normal range with a value of 9  Ultrasound 09/14/2015: Uterus measures 7.4 x 6.8 x 5.5 cm endometrial stripe 3.3 mm. Patient with 3 intramural fibroids the largest one measuring 31 x 31 mm. Endometrium filled with fluid 11 x 4 mm. Right ovary was normal. Left ovary thinwall echo-free avascular cyst measuring 25 x 21 x 20 mm with calcified focus in the wall of the cyst measuring 3 x 5 mm negative fluid in the cul-de-sac.  Assessment/plan: Patient with past history of endometriosis, history of endometrial ablation continues to suffer from dysmenorrhea menorrhagia and left lower quadrant pain and persistent left ovarian cyst will be scheduled in the near future for laparoscopic-assisted vaginal hysterectomy along with left salpingo-oophorectomy and right salpingectomy. Literature information was provided and we'll going to schedule accordingly. Patient with past tubal ligation.

## 2015-09-14 NOTE — Patient Instructions (Signed)
Ovarian Cyst An ovarian cyst is a fluid-filled sac that forms on an ovary. The ovaries are small organs that produce eggs in women. Various types of cysts can form on the ovaries. Most are not cancerous. Many do not cause problems, and they often go away on their own. Some may cause symptoms and require treatment. Common types of ovarian cysts include:  Functional cysts--These cysts may occur every month during the menstrual cycle. This is normal. The cysts usually go away with the next menstrual cycle if the woman does not get pregnant. Usually, there are no symptoms with a functional cyst.  Endometrioma cysts--These cysts form from the tissue that lines the uterus. They are also called "chocolate cysts" because they become filled with blood that turns brown. This type of cyst can cause pain in the lower abdomen during intercourse and with your menstrual period.  Cystadenoma cysts--This type develops from the cells on the outside of the ovary. These cysts can get very big and cause lower abdomen pain and pain with intercourse. This type of cyst can twist on itself, cut off its blood supply, and cause severe pain. It can also easily rupture and cause a lot of pain.  Dermoid cysts--This type of cyst is sometimes found in both ovaries. These cysts may contain different kinds of body tissue, such as skin, teeth, hair, or cartilage. They usually do not cause symptoms unless they get very big.  Theca lutein cysts--These cysts occur when too much of a certain hormone (human chorionic gonadotropin) is produced and overstimulates the ovaries to produce an egg. This is most common after procedures used to assist with the conception of a baby (in vitro fertilization). CAUSES   Fertility drugs can cause a condition in which multiple large cysts are formed on the ovaries. This is called ovarian hyperstimulation syndrome.  A condition called polycystic ovary syndrome can cause hormonal imbalances that can lead to  nonfunctional ovarian cysts. SIGNS AND SYMPTOMS  Many ovarian cysts do not cause symptoms. If symptoms are present, they may include:  Pelvic pain or pressure.  Pain in the lower abdomen.  Pain during sexual intercourse.  Increasing girth (swelling) of the abdomen.  Abnormal menstrual periods.  Increasing pain with menstrual periods.  Stopping having menstrual periods without being pregnant. DIAGNOSIS  These cysts are commonly found during a routine or annual pelvic exam. Tests may be ordered to find out more about the cyst. These tests may include:  Ultrasound.  X-ray of the pelvis.  CT scan.  MRI.  Blood tests. TREATMENT  Many ovarian cysts go away on their own without treatment. Your health care provider may want to check your cyst regularly for 2-3 months to see if it changes. For women in menopause, it is particularly important to monitor a cyst closely because of the higher rate of ovarian cancer in menopausal women. When treatment is needed, it may include any of the following:  A procedure to drain the cyst (aspiration). This may be done using a long needle and ultrasound. It can also be done through a laparoscopic procedure. This involves using a thin, lighted tube with a tiny camera on the end (laparoscope) inserted through a small incision.  Surgery to remove the whole cyst. This may be done using laparoscopic surgery or an open surgery involving a larger incision in the lower abdomen.  Hormone treatment or birth control pills. These methods are sometimes used to help dissolve a cyst. HOME CARE INSTRUCTIONS   Only take over-the-counter   or prescription medicines as directed by your health care provider.  Follow up with your health care provider as directed.  Get regular pelvic exams and Pap tests. SEEK MEDICAL CARE IF:   Your periods are late, irregular, or painful, or they stop.  Your pelvic pain or abdominal pain does not go away.  Your abdomen becomes  larger or swollen.  You have pressure on your bladder or trouble emptying your bladder completely.  You have pain during sexual intercourse.  You have feelings of fullness, pressure, or discomfort in your stomach.  You lose weight for no apparent reason.  You feel generally ill.  You become constipated.  You lose your appetite.  You develop acne.  You have an increase in body and facial hair.  You are gaining weight, without changing your exercise and eating habits.  You think you are pregnant. SEEK IMMEDIATE MEDICAL CARE IF:   You have increasing abdominal pain.  You feel sick to your stomach (nauseous), and you throw up (vomit).  You develop a fever that comes on suddenly.  You have abdominal pain during a bowel movement.  Your menstrual periods become heavier than usual. MAKE SURE YOU:  Understand these instructions.  Will watch your condition.  Will get help right away if you are not doing well or get worse.   This information is not intended to replace advice given to you by your health care provider. Make sure you discuss any questions you have with your health care provider.   Document Released: 08/11/2005 Document Revised: 08/16/2013 Document Reviewed: 04/18/2013 Elsevier Interactive Patient Education 2016 [employer] Uterine Fibroids Uterine fibroids are tissue masses (tumors) that can develop in the womb (uterus). They are also called leiomyomas. This type of tumor is not cancerous (benign) and does not spread to other parts of the body outside of the pelvic area, which is between the hip bones. Occasionally, fibroids may develop in the fallopian tubes, in the cervix, or on the support structures (ligaments) that surround the uterus. You can have one or many fibroids. Fibroids can vary in size, weight, and where they grow in the uterus. Some can become quite large. Most fibroids do not require medical treatment. CAUSES A fibroid can develop when a single  uterine cell keeps growing (replicating). Most cells in the human body have a control mechanism that keeps them from replicating without control. SIGNS AND SYMPTOMS Symptoms may include:   Heavy bleeding during your period.  Bleeding or spotting between periods.  Pelvic pain and pressure.  Bladder problems, such as needing to urinate more often (urinary frequency) or urgently.  Inability to reproduce offspring (infertility).  Miscarriages. DIAGNOSIS Uterine fibroids are diagnosed through a physical exam. Your health care provider may feel the lumpy tumors during a pelvic exam. Ultrasonography and an MRI may be done to determine the size, location, and number of fibroids. TREATMENT Treatment may include:  Watchful waiting. This involves getting the fibroid checked by your health care provider to see if it grows or shrinks. Follow your health care provider's recommendations for how often to have this checked.  Hormone medicines. These can be taken by mouth or given through an intrauterine device (IUD).  Surgery.  Removing the fibroids (myomectomy) or the uterus (hysterectomy).  Removing blood supply to the fibroids (uterine artery embolization). If fibroids interfere with your fertility and you want to become pregnant, your health care provider may recommend having the fibroids removed.  HOME CARE INSTRUCTIONS  Keep all follow-up visits as  directed by your health care provider. This is important.  Take medicines only as directed by your health care provider.  If you were prescribed a hormone treatment, take the hormone medicines exactly as directed.  Do not take aspirin, because it can cause bleeding.  Ask your health care provider about taking iron pills and increasing the amount of dark green, leafy vegetables in your diet. These actions can help to boost your blood iron levels, which may be affected by heavy menstrual bleeding.  Pay close attention to your period and tell  your health care provider about any changes, such as:  Increased blood flow that requires you to use more pads or tampons than usual per month.  A change in the number of days that your period lasts per month.  A change in symptoms that are associated with your period, such as abdominal cramping or back pain. SEEK MEDICAL CARE IF:  You have pelvic pain, back pain, or abdominal cramps that cannot be controlled with medicines.  You have an increase in bleeding between and during periods.  You soak tampons or pads in a half hour or less.  You feel lightheaded, extra tired, or weak. SEEK IMMEDIATE MEDICAL CARE IF:  You faint.  You have a sudden increase in pelvic pain.   This information is not intended to replace advice given to you by your health care provider. Make sure you discuss any questions you have with your health care provider.   Document Released: 08/08/2000 Document Revised: 09/01/2014 Document Reviewed: 02/07/2014 Elsevier Interactive Patient Education [employer]. Endometriosis Endometriosis is a condition in which the tissue that lines the uterus (endometrium) grows outside of its normal location. The tissue may grow in many locations close to the uterus, but it commonly grows on the ovaries, fallopian tubes, vagina, or bowel. Because the uterus expels, or sheds, its lining every menstrual cycle, there is bleeding wherever the endometrial tissue is located. This can cause pain because blood is irritating to tissues not normally exposed to it.  CAUSES  The cause of endometriosis is not known.  SIGNS AND SYMPTOMS  Often, there are no symptoms. When symptoms are present, they can vary with the location of the displaced tissue. Various symptoms can occur at different times. Although symptoms occur mainly during a woman's menstrual period, they can also occur midcycle and usually stop with menopause. Some people may go months with no symptoms at all. Symptoms may include:    Back or abdominal pain.   Heavier bleeding during periods.   Pain during intercourse.   Painful bowel movements.   Infertility. DIAGNOSIS  Your health care provider will do a physical exam and ask about your symptoms. Various tests may be done, such as:   Blood tests and urine tests. These are done to help rule out other problems.   Ultrasound. This test is done to look for abnormal tissue.   An X-ray of the lower bowel (barium enema).  Laparoscopy. In this procedure, a thin, lighted tube with a tiny camera on the end (laparoscope) is inserted into your abdomen. This helps your health care provider look for abnormal tissue to confirm the diagnosis. The health care provider may also remove a small piece of tissue (biopsy) from any abnormal tissue found. This tissue sample can then be sent to a lab so it can be looked at under a microscope. TREATMENT  Treatment will vary and may include:   Medicines to relieve pain. Nonsteroidal anti-inflammatory drugs (NSAIDs) are  a type of pain medicine that can help to relieve the pain caused by endometriosis.  Hormonal therapy. When using hormonal therapy, periods are eliminated. This eliminates the monthly exposure to blood by the displaced endometrial tissue.   Surgery. Surgery may sometimes be done to remove the abnormal endometrial tissue. In severe cases, surgery may be done to remove the fallopian tubes, uterus, and ovaries (hysterectomy). HOME CARE INSTRUCTIONS   Take all medicines as directed by your health care provider. Do not take aspirin because it may increase bleeding when you are not on hormonal therapy.   Avoid activities that produce pain, including sexual activity. SEEK MEDICAL CARE IF:  You have pelvic pain before, after, or during your periods.  You have pelvic pain between periods that gets worse during your period.  You have pelvic pain during or after sex.  You have pelvic pain with bowel movements or  urination, especially during your period.  You have problems getting pregnant.  You have a fever. SEEK IMMEDIATE MEDICAL CARE IF:   Your pain is severe and is not responding to pain medicine.   You have severe nausea and vomiting, or you cannot keep foods down.   You have pain that is limited to the right lower part of your abdomen.   You have swelling or increasing pain in your abdomen.   You see blood in your stool.  MAKE SURE YOU:   Understand these instructions.  Will watch your condition.  Will get help right away if you are not doing well or get worse.   This information is not intended to replace advice given to you by your health care provider. Make sure you discuss any questions you have with your health care provider.   Document Released: 08/08/2000 Document Revised: 09/01/2014 Document Reviewed: 04/08/2013 Elsevier Interactive Patient Education 2016 [place]. Laparoscopically Assisted Vaginal Hysterectomy A laparoscopically assisted vaginal hysterectomy (LAVH) is a surgical procedure to remove the uterus and cervix, and sometimes the ovaries and fallopian tubes. During an LAVH, some of the surgical removal is done through the vagina, and the rest is done through a few small surgical cuts (incisions) in the abdomen.  This procedure is usually considered in women when a vaginal hysterectomy is not an option. Your health care provider will discuss the risks and benefits of the different surgical techniques at your appointment. Generally, recovery time is faster and there are fewer complications after laparoscopic procedures than after open incisional procedures. LET [facility] CARE PROVIDER KNOW ABOUT:   Any allergies you have.  All medicines you are taking, including vitamins, herbs, eye drops, creams, and over-the-counter medicines.  Previous problems you or members of your family have had with the use of anesthetics.  Any blood disorders you  have.  Previous surgeries you have had.  Medical conditions you have. RISKS AND COMPLICATIONS Generally, this is a safe procedure. However, as with any procedure, complications can occur. Possible complications include:  Allergies to medicines.  Difficulty breathing.  Bleeding.  Infection.  Damage to other structures near your uterus and cervix. BEFORE THE PROCEDURE  Ask your health care provider about changing or stopping your regular medicines.  Take certain medicines, such as a colon-emptying preparation, as directed.  Do not eat or drink anything for at least 8 hours before your surgery.  Stop smoking if you smoke. Stopping will improve your health after surgery.  Arrange for a ride home after surgery and for help at home during recovery. PROCEDURE   An  IV tube will be put into one of your veins in order to give you fluids and medicines.  You will receive medicines to relax you and medicines that make you sleep (general anesthetic).  You may have a flexible tube (catheter) put into your bladder to drain urine.  You may have a tube put through your nose or mouth that goes into your stomach (nasogastric tube). The nasogastric tube removes digestive fluids and prevents you from feeling nauseated and from vomiting.  Tight-fitting (compression) stockings will be placed on your legs to promote circulation.  Three to four small incisions will be made in your abdomen. An incision also will be made in your vagina. Probes and tools will be inserted into the small incisions. The uterus and cervix are removed (and possibly your ovaries and fallopian tubes) through your vagina as well as through the small incisions that were made in the abdomen.  Your vagina is then sewn back to normal. AFTER THE PROCEDURE  You may have a liquid diet temporarily. You will most likely return to, and tolerate, your usual diet the day after surgery.  You will be passing urine through a catheter.  It will be removed the day after surgery.  Your temperature, breathing rate, heart rate, blood pressure, and oxygen level will be monitored regularly.  You will still wear compression stockings on your legs until you are able to move around.  You will use a special device or do breathing exercises to keep your lungs clear.  You will be encouraged to walk as soon as possible.   This information is not intended to replace advice given to you by your health care provider. Make sure you discuss any questions you have with your health care provider.   Document Released: 07/31/2011 Document Revised: 09/01/2014 Document Reviewed: 02/24/2013 Elsevier Interactive Patient Education [employer].

## 2015-09-17 ENCOUNTER — Telehealth: Payer: Self-pay

## 2015-09-17 NOTE — Telephone Encounter (Signed)
Left message for patient to call me. I have a surgery order for her.

## 2015-09-20 ENCOUNTER — Telehealth: Payer: Self-pay

## 2015-09-20 NOTE — Telephone Encounter (Signed)
Patient called me back to tell me she had worked out the financial details of surgery and is ready to schedule.  Scheduled her for Feb 21 at 9:45am. And pre op appt was scheduled.

## 2015-09-25 ENCOUNTER — Encounter: Payer: Self-pay | Admitting: Gynecology

## 2015-10-03 ENCOUNTER — Inpatient Hospital Stay (HOSPITAL_COMMUNITY): Admission: RE | Admit: 2015-10-03 | Payer: BLUE CROSS/BLUE SHIELD | Source: Ambulatory Visit

## 2015-10-04 ENCOUNTER — Encounter (HOSPITAL_COMMUNITY): Payer: Self-pay

## 2015-10-04 ENCOUNTER — Encounter (HOSPITAL_COMMUNITY)
Admission: RE | Admit: 2015-10-04 | Discharge: 2015-10-04 | Disposition: A | Discharge status: Home / Self Care | Payer: BLUE CROSS/BLUE SHIELD | Source: Ambulatory Visit | Attending: Gynecology | Admitting: Gynecology

## 2015-10-04 DIAGNOSIS — Z01812 Encounter for preprocedural laboratory examination: Secondary | ICD-10-CM | POA: Diagnosis present

## 2015-10-04 LAB — CBC
HCT: 39.1 % (ref 36.0–46.0)
Hemoglobin: 13.4 g/dL (ref 12.0–15.0)
MCH: 31.6 pg (ref 26.0–34.0)
MCHC: 34.3 g/dL (ref 30.0–36.0)
MCV: 92.2 fL (ref 78.0–100.0)
PLATELETS: 251 10*3/uL (ref 150–400)
RBC: 4.24 MIL/uL (ref 3.87–5.11)
RDW: 13.7 % (ref 11.5–15.5)
WBC: 8.8 10*3/uL (ref 4.0–10.5)

## 2015-10-04 NOTE — Patient Instructions (Addendum)
   Your procedure is scheduled on: February 21 (TUESDAY)  Enter through the Main Entrance of [facility] at: [place] up the phone at the desk and dial [phone] and inform us of your arrival.  Please call this number if you have any problems the morning of surgery: [phone]  DO NOT EAT OR DRINK AFTER MIDNIGHT February 20   Do not wear jewelry, make-up,  No metal in your hair or on your body. Do not wear lotions, powders, perfumes.  You may wear deodorant.  Do not bring valuables to the hospital. Contacts, dentures or bridgework may not be worn into surgery.  Leave suitcase in the car. After Surgery it may be brought to your room. For patients being admitted to the hospital, checkout time is 11:00am the day of discharge.

## 2015-10-11 ENCOUNTER — Ambulatory Visit (INDEPENDENT_AMBULATORY_CARE_PROVIDER_SITE_OTHER): Payer: BLUE CROSS/BLUE SHIELD | Admitting: Gynecology

## 2015-10-11 ENCOUNTER — Encounter: Payer: Self-pay | Admitting: Gynecology

## 2015-10-11 VITALS — BP 128/86

## 2015-10-11 DIAGNOSIS — N946 Dysmenorrhea, unspecified: Secondary | ICD-10-CM

## 2015-10-11 DIAGNOSIS — Z01818 Encounter for other preprocedural examination: Secondary | ICD-10-CM | POA: Diagnosis not present

## 2015-10-11 DIAGNOSIS — R102 Pelvic and perineal pain: Secondary | ICD-10-CM | POA: Diagnosis not present

## 2015-10-11 DIAGNOSIS — Z8742 Personal history of other diseases of the female genital tract: Secondary | ICD-10-CM | POA: Diagnosis not present

## 2015-10-11 DIAGNOSIS — Z0289 Encounter for other administrative examinations: Secondary | ICD-10-CM

## 2015-10-11 DIAGNOSIS — D251 Intramural leiomyoma of uterus: Secondary | ICD-10-CM

## 2015-10-11 DIAGNOSIS — N920 Excessive and frequent menstruation with regular cycle: Secondary | ICD-10-CM | POA: Diagnosis not present

## 2015-10-11 DIAGNOSIS — N83202 Unspecified ovarian cyst, left side: Secondary | ICD-10-CM

## 2015-10-11 MED ORDER — OXYCODONE-ACETAMINOPHEN 5-325 MG PO TABS: 1.0000 | ORAL_TABLET | ORAL | Status: DC | PRN

## 2015-10-11 MED ORDER — METOCLOPRAMIDE HCL 10 MG PO TABS: 10.0000 mg | ORAL_TABLET | Freq: Three times a day (TID) | ORAL | Status: DC

## 2015-10-11 NOTE — Progress Notes (Signed)
[name] is an 45 y.o. female. Here for preop examination. Her history is as follows:  She was seen in the office for her annual exam on September 30 and then been complaining at that time of low abdominal discomfort. Patient with past history of fibroid uterus. Patient was asymptomatic today. Patient's had a previous tubal ligation procedure.  Ultrasound Oct 2016: Uterus measured 8.3 x 6.6 x 5.6 cm with endometrial stripe of 6.9 mm (last picture. 06/07/2015). Patient with 3 intramural fibroids the largest one measuring 29 x 26 mm. Right ovary was located behind the uterine wall but normal in appearance. A thin-walled left ovarian cyst which was echo-free was seen negative color flow. A 5 x 4 cm cluster of 3 solid calcified foci at the wall of the cyst was noted. No fluid in the cul-de-sac CA 125 was 9.  Ultrasound  09/14/2015: Uterus measures 7.4 x 6.8 x 5.5 cm endometrial stripe 3.3 mm. Patient with 3 intramural fibroids the largest one measuring 31 x 31 mm. Endometrium filled with fluid 11 x 4 mm. Right ovary was normal. Left ovary thinwall echo-free avascular cyst measuring 25 x 21 x 20 mm with calcified focus in the wall of the cyst measuring 3 x 5 mm negative fluid in the cul-de-sac.  Patient with past history of endometriosis, history of endometrial ablation continues to suffer from dysmenorrhea menorrhagia and left lower quadrant pain and persistent left ovarian cyst  Is  scheduled ifor laparoscopic-assisted vaginal hysterectomy along with left salpingo-oophorectomy and right salpingectomy    Pertinent Gynecological History: Menses: with severe dysmenorrhea, heavy Bleeding: see above Contraception: tubal ligation DES exposure: unknown Blood transfusions: post partum Sexually transmitted diseases: no past history Previous GYN Procedures: Cryoablation of uterus, laparoscopy, csection/BTL Last mammogram: normal Date: 2015 Last pap: normal Date: 2015 OB History: G2,  P2   Menstrual History: Menarche age: 89 Patient's last menstrual period was 09/11/2015.    Past Medical History  Diagnosis Date  . HELLP (hemolytic anemia/elev liver enzymes/low platelets in pregnancy)     hx of two pregnancies  . Normal spontaneous vaginal delivery   . Miscarriage   . Endometriosis   . Acne   . Thyroid dysfunction     Past Surgical History  Procedure Laterality Date  . Cesarean section    . Tubal ligation    . Dilation and curettage of uterus      Family History  Problem Relation Age of Onset  . Diabetes Mother   . Hypertension Mother   . Thyroid disease Mother   . Cancer Mother     THYROID    Social History:  reports that she has never smoked. She has never used smokeless tobacco. She reports that she drinks alcohol. She reports that she does not use illicit drugs.  Allergies:  Allergies  Allergen Reactions  . Iodine Anaphylaxis  . Shellfish Allergy Anaphylaxis     (Not in a hospital admission)  REVIEW OF SYSTEMS: A ROS was performed and pertinent positives and negatives are included in the history.  GENERAL: No fevers or chills. HEENT: No change in vision, no earache, sore throat or sinus congestion. NECK: No pain or stiffness. CARDIOVASCULAR: No chest pain or pressure. No palpitations. PULMONARY: No shortness of breath, cough or wheeze. GASTROINTESTINAL: No abdominal pain, nausea, vomiting or diarrhea, melena or bright red blood per rectum. GENITOURINARY: No urinary frequency, urgency, hesitancy or dysuria. MUSCULOSKELETAL: No joint or muscle pain, no back pain, no recent trauma. DERMATOLOGIC: No rash, no  itching, no lesions. ENDOCRINE: No polyuria, polydipsia, no heat or cold intolerance. No recent change in weight. HEMATOLOGICAL: No anemia or easy bruising or bleeding. NEUROLOGIC: No headache, seizures, numbness, tingling or weakness. PSYCHIATRIC: No depression, no loss of interest in normal activity or change in sleep pattern.     Blood  pressure 128/86, last menstrual period 09/11/2015.  Physical Exam:  HEENT:unremarkable Neck:Supple, midline, no thyroid megaly, no carotid bruits Lungs:  Clear to auscultation no rhonchi's or wheezes Heart:Regular rate and rhythm, no murmurs or gallops Breast Exam: Symmetrical in appearance no palpable mass or tenderness no supraclavicular axillary lymphadenopathy Abdomen: Soft nontender no rebound or guarding Pelvic:BUS within normal limits Vagina: No lesions or discharge Cervix: No lesions or discharge Uterus: Anteverted 8 weeks size and slightly irregular Adnexa: No palpable masses or tenderness Extremities: No cords, no edema Rectal: Unremarkable  No results found for this or any previous visit (from the past 24 hour(s)).  No results found.  Assessment/Plan:Patient with past history of endometriosis, history of endometrial ablation continues to suffer from dysmenorrhea menorrhagia and left lower quadrant pain and persistent left ovarian cyst  Is  scheduled ifor laparoscopic-assisted vaginal hysterectomy along with left salpingo-oophorectomy and right salpingectomy the following risk were discussed with the patient in reference to her surgery:                        Patient was counseled as to the risk of surgery to include the following:  1. Infection (prohylactic antibiotics will be administered)  2. DVT/Pulmonary Embolism (prophylactic pneumo compression stockings will be used)  3.Trauma to internal organs requiring additional surgical procedure to repair any injury to     Internal organs requiring perhaps additional hospitalization days.  4.Hemmorhage requiring transfusion and blood products which carry risks such as             anaphylactic reaction, hepatitis and AIDS  Patient had received literature information on the procedure scheduled and all her questions were answered and fully accepts all risk.   [facility] [id number] AMTD@Note :        Note: This  dictation was prepared with  Dragon/digital dictation along Games developer. Any transcriptional errors that result from this process are unintentional.

## 2015-10-11 NOTE — Patient Instructions (Signed)
Laparoscopically Assisted Vaginal Hysterectomy A laparoscopically assisted vaginal hysterectomy (LAVH) is a surgical procedure to remove the uterus and cervix, and sometimes the ovaries and fallopian tubes. During an LAVH, some of the surgical removal is done through the vagina, and the rest is done through a few small surgical cuts (incisions) in the abdomen.  This procedure is usually considered in women when a vaginal hysterectomy is not an option. Your health care provider will discuss the risks and benefits of the different surgical techniques at your appointment. Generally, recovery time is faster and there are fewer complications after laparoscopic procedures than after open incisional procedures. LET YOUR HEALTH CARE PROVIDER KNOW ABOUT:   Any allergies you have.  All medicines you are taking, including vitamins, herbs, eye drops, creams, and over-the-counter medicines.  Previous problems you or members of your family have had with the use of anesthetics.  Any blood disorders you have.  Previous surgeries you have had.  Medical conditions you have. RISKS AND COMPLICATIONS Generally, this is a safe procedure. However, as with any procedure, complications can occur. Possible complications include:  Allergies to medicines.  Difficulty breathing.  Bleeding.  Infection.  Damage to other structures near your uterus and cervix. BEFORE THE PROCEDURE  Ask your health care provider about changing or stopping your regular medicines.  Take certain medicines, such as a colon-emptying preparation, as directed.  Do not eat or drink anything for at least 8 hours before your surgery.  Stop smoking if you smoke. Stopping will improve your health after surgery.  Arrange for a ride home after surgery and for help at home during recovery. PROCEDURE   An IV tube will be put into one of your veins in order to give you fluids and medicines.  You will receive medicines to relax you and  medicines that make you sleep (general anesthetic).  You may have a flexible tube (catheter) put into your bladder to drain urine.  You may have a tube put through your nose or mouth that goes into your stomach (nasogastric tube). The nasogastric tube removes digestive fluids and prevents you from feeling nauseated and from vomiting.  Tight-fitting (compression) stockings will be placed on your legs to promote circulation.  Three to four small incisions will be made in your abdomen. An incision also will be made in your vagina. Probes and tools will be inserted into the small incisions. The uterus and cervix are removed (and possibly your ovaries and fallopian tubes) through your vagina as well as through the small incisions that were made in the abdomen.  Your vagina is then sewn back to normal. AFTER THE PROCEDURE  You may have a liquid diet temporarily. You will most likely return to, and tolerate, your usual diet the day after surgery.  You will be passing urine through a catheter. It will be removed the day after surgery.  Your temperature, breathing rate, heart rate, blood pressure, and oxygen level will be monitored regularly.  You will still wear compression stockings on your legs until you are able to move around.  You will use a special device or do breathing exercises to keep your lungs clear.  You will be encouraged to walk as soon as possible.   This information is not intended to replace advice given to you by your health care provider. Make sure you discuss any questions you have with your health care provider.   Document Released: 07/31/2011 Document Revised: 09/01/2014 Document Reviewed: 02/24/2013 Elsevier Interactive Patient Education 2016 Else[employer]   Inc[employer]  

## 2015-10-12 DIAGNOSIS — Z8742 Personal history of other diseases of the female genital tract: Secondary | ICD-10-CM | POA: Insufficient documentation

## 2015-10-15 ENCOUNTER — Telehealth: Payer: Self-pay

## 2015-10-15 MED ORDER — DEXTROSE 5 % IV SOLN
2.0000 g | INTRAVENOUS | Status: AC
  Administered 2015-10-16: 2 g via INTRAVENOUS
  Filled 2015-10-15: qty 2

## 2015-10-15 MED ORDER — PHENAZOPYRIDINE HCL 200 MG PO TABS: ORAL_TABLET | ORAL | Status: DC

## 2015-10-15 NOTE — Telephone Encounter (Signed)
I called patient and left detailed message that Dr. Moshe Salisbury has informed me that he would like her to take tablet tonight hs and tomorrow am before leaving home for surgery.  Take only with a sip of water.  I told her I have sent Rx but if she will call me I will explain.

## 2015-10-15 NOTE — Telephone Encounter (Signed)
Patient called and was informed

## 2015-10-16 ENCOUNTER — Observation Stay (HOSPITAL_COMMUNITY)
Admission: RE | Admit: 2015-10-16 | Discharge: 2015-10-17 | Disposition: A | Discharge status: Home / Self Care | Payer: BLUE CROSS/BLUE SHIELD | Source: Ambulatory Visit | Attending: Gynecology | Admitting: Gynecology

## 2015-10-16 ENCOUNTER — Ambulatory Visit (HOSPITAL_COMMUNITY): Payer: BLUE CROSS/BLUE SHIELD | Admitting: Anesthesiology

## 2015-10-16 ENCOUNTER — Encounter (HOSPITAL_COMMUNITY)
Admission: RE | Disposition: A | Discharge status: Home / Self Care | Payer: Self-pay | Source: Ambulatory Visit | Attending: Gynecology

## 2015-10-16 ENCOUNTER — Encounter (HOSPITAL_COMMUNITY): Payer: Self-pay | Admitting: Gynecology

## 2015-10-16 DIAGNOSIS — N83202 Unspecified ovarian cyst, left side: Secondary | ICD-10-CM | POA: Insufficient documentation

## 2015-10-16 DIAGNOSIS — D251 Intramural leiomyoma of uterus: Principal | ICD-10-CM | POA: Insufficient documentation

## 2015-10-16 DIAGNOSIS — N941 Unspecified dyspareunia: Secondary | ICD-10-CM | POA: Diagnosis not present

## 2015-10-16 DIAGNOSIS — D259 Leiomyoma of uterus, unspecified: Secondary | ICD-10-CM | POA: Diagnosis present

## 2015-10-16 DIAGNOSIS — N920 Excessive and frequent menstruation with regular cycle: Secondary | ICD-10-CM

## 2015-10-16 DIAGNOSIS — N946 Dysmenorrhea, unspecified: Secondary | ICD-10-CM | POA: Diagnosis not present

## 2015-10-16 DIAGNOSIS — Z9889 Other specified postprocedural states: Secondary | ICD-10-CM

## 2015-10-16 HISTORY — PX: CYSTOSCOPY: SHX5120

## 2015-10-16 HISTORY — PX: BILATERAL SALPINGECTOMY: SHX5743

## 2015-10-16 HISTORY — PX: LAPAROSCOPIC VAGINAL HYSTERECTOMY WITH SALPINGO OOPHORECTOMY: SHX6681

## 2015-10-16 LAB — PREGNANCY, URINE: Preg Test, Ur: NEGATIVE

## 2015-10-16 SURGERY — HYSTERECTOMY, VAGINAL, LAPAROSCOPY-ASSISTED, WITH SALPINGO-OOPHORECTOMY
Anesthesia: General | Laterality: Right

## 2015-10-16 MED ORDER — OXYCODONE-ACETAMINOPHEN 5-325 MG PO TABS
1.0000 | ORAL_TABLET | ORAL | Status: DC | PRN
  Administered 2015-10-16 – 2015-10-17 (×3): 1 via ORAL
  Filled 2015-10-16 (×3): qty 1

## 2015-10-16 MED ORDER — PROMETHAZINE HCL 25 MG/ML IJ SOLN: 6.2500 mg | INTRAMUSCULAR | Status: DC | PRN

## 2015-10-16 MED ORDER — MIDAZOLAM HCL 2 MG/2ML IJ SOLN
INTRAMUSCULAR | Status: AC
  Filled 2015-10-16: qty 2

## 2015-10-16 MED ORDER — METOCLOPRAMIDE HCL 10 MG PO TABS
10.0000 mg | ORAL_TABLET | Freq: Three times a day (TID) | ORAL | Status: DC
  Administered 2015-10-17: 10 mg via ORAL
  Filled 2015-10-16 (×2): qty 1

## 2015-10-16 MED ORDER — ONDANSETRON HCL 4 MG/2ML IJ SOLN
4.0000 mg | Freq: Four times a day (QID) | INTRAMUSCULAR | Status: DC | PRN
  Administered 2015-10-16: 4 mg via INTRAVENOUS
  Filled 2015-10-16: qty 2

## 2015-10-16 MED ORDER — PROPOFOL 10 MG/ML IV BOLUS
INTRAVENOUS | Status: AC
  Filled 2015-10-16: qty 20

## 2015-10-16 MED ORDER — LACTATED RINGERS IR SOLN
Status: DC | PRN
  Administered 2015-10-16: 3000 mL

## 2015-10-16 MED ORDER — FENTANYL CITRATE (PF) 250 MCG/5ML IJ SOLN
INTRAMUSCULAR | Status: AC
  Filled 2015-10-16: qty 5

## 2015-10-16 MED ORDER — ROCURONIUM BROMIDE 100 MG/10ML IV SOLN
INTRAVENOUS | Status: DC | PRN
  Administered 2015-10-16: 10 mg via INTRAVENOUS
  Administered 2015-10-16: 40 mg via INTRAVENOUS

## 2015-10-16 MED ORDER — BUPIVACAINE HCL (PF) 0.25 % IJ SOLN
INTRAMUSCULAR | Status: DC | PRN
  Administered 2015-10-16: 5 mL

## 2015-10-16 MED ORDER — DEXAMETHASONE SODIUM PHOSPHATE 10 MG/ML IJ SOLN
INTRAMUSCULAR | Status: DC | PRN
  Administered 2015-10-16: 4 mg via INTRAVENOUS

## 2015-10-16 MED ORDER — HYDROMORPHONE HCL 1 MG/ML IJ SOLN
0.2500 mg | INTRAMUSCULAR | Status: DC | PRN
  Administered 2015-10-16 (×2): 0.5 mg via INTRAVENOUS
  Administered 2015-10-16 (×2): 0.25 mg via INTRAVENOUS

## 2015-10-16 MED ORDER — SODIUM CHLORIDE 0.9% FLUSH: 9.0000 mL | INTRAVENOUS | Status: DC | PRN

## 2015-10-16 MED ORDER — FENTANYL CITRATE (PF) 100 MCG/2ML IJ SOLN
INTRAMUSCULAR | Status: DC | PRN
  Administered 2015-10-16 (×2): 100 ug via INTRAVENOUS

## 2015-10-16 MED ORDER — HYDROMORPHONE HCL 1 MG/ML IJ SOLN
INTRAMUSCULAR | Status: AC
  Administered 2015-10-16: 0.5 mg via INTRAVENOUS
  Filled 2015-10-16: qty 1

## 2015-10-16 MED ORDER — DIPHENHYDRAMINE HCL 12.5 MG/5ML PO ELIX: 12.5000 mg | ORAL_SOLUTION | Freq: Four times a day (QID) | ORAL | Status: DC | PRN

## 2015-10-16 MED ORDER — PROPOFOL 10 MG/ML IV BOLUS
INTRAVENOUS | Status: DC | PRN
  Administered 2015-10-16: 180 mg via INTRAVENOUS

## 2015-10-16 MED ORDER — MORPHINE SULFATE 2 MG/ML IV SOLN
INTRAVENOUS | Status: DC
  Administered 2015-10-16: 14:00:00 via INTRAVENOUS
  Administered 2015-10-16 (×2): 1 mg via INTRAVENOUS
  Filled 2015-10-16: qty 25

## 2015-10-16 MED ORDER — DIPHENHYDRAMINE HCL 50 MG/ML IJ SOLN: 12.5000 mg | Freq: Four times a day (QID) | INTRAMUSCULAR | Status: DC | PRN

## 2015-10-16 MED ORDER — LIDOCAINE HCL (CARDIAC) 20 MG/ML IV SOLN
INTRAVENOUS | Status: DC | PRN
  Administered 2015-10-16: 80 mg via INTRAVENOUS

## 2015-10-16 MED ORDER — OXYCODONE HCL 5 MG/5ML PO SOLN: 5.0000 mg | Freq: Once | ORAL | Status: DC | PRN

## 2015-10-16 MED ORDER — ONDANSETRON HCL 4 MG/2ML IJ SOLN
INTRAMUSCULAR | Status: AC
  Filled 2015-10-16: qty 2

## 2015-10-16 MED ORDER — MIDAZOLAM HCL 2 MG/2ML IJ SOLN
INTRAMUSCULAR | Status: DC | PRN
  Administered 2015-10-16: 2 mg via INTRAVENOUS

## 2015-10-16 MED ORDER — NALOXONE HCL 0.4 MG/ML IJ SOLN: 0.4000 mg | INTRAMUSCULAR | Status: DC | PRN

## 2015-10-16 MED ORDER — LACTATED RINGERS IV SOLN
INTRAVENOUS | Status: DC
  Administered 2015-10-16 – 2015-10-17 (×2): via INTRAVENOUS

## 2015-10-16 MED ORDER — OXYCODONE HCL 5 MG PO TABS: 5.0000 mg | ORAL_TABLET | Freq: Once | ORAL | Status: DC | PRN

## 2015-10-16 MED ORDER — HYDROMORPHONE HCL 1 MG/ML IJ SOLN
INTRAMUSCULAR | Status: AC
  Filled 2015-10-16: qty 1

## 2015-10-16 MED ORDER — LIDOCAINE-EPINEPHRINE 1 %-1:100000 IJ SOLN
INTRAMUSCULAR | Status: AC
  Filled 2015-10-16: qty 1

## 2015-10-16 MED ORDER — LACTATED RINGERS IV SOLN
INTRAVENOUS | Status: DC
  Administered 2015-10-16 (×4): via INTRAVENOUS

## 2015-10-16 MED ORDER — LIDOCAINE-EPINEPHRINE 1 %-1:100000 IJ SOLN
INTRAMUSCULAR | Status: DC | PRN
  Administered 2015-10-16: 20 mL

## 2015-10-16 MED ORDER — BUPIVACAINE HCL (PF) 0.25 % IJ SOLN
INTRAMUSCULAR | Status: AC
  Filled 2015-10-16: qty 30

## 2015-10-16 MED ORDER — SCOPOLAMINE 1 MG/3DAYS TD PT72
MEDICATED_PATCH | TRANSDERMAL | Status: AC
  Administered 2015-10-16: 1.5 mg via TRANSDERMAL
  Filled 2015-10-16: qty 1

## 2015-10-16 MED ORDER — STERILE WATER FOR IRRIGATION IR SOLN
Status: DC | PRN
  Administered 2015-10-16: 1000 mL

## 2015-10-16 MED ORDER — LIDOCAINE HCL (CARDIAC) 20 MG/ML IV SOLN
INTRAVENOUS | Status: AC
  Filled 2015-10-16: qty 5

## 2015-10-16 MED ORDER — SCOPOLAMINE 1 MG/3DAYS TD PT72
1.0000 | MEDICATED_PATCH | Freq: Once | TRANSDERMAL | Status: DC
  Administered 2015-10-16: 1.5 mg via TRANSDERMAL

## 2015-10-16 MED ORDER — ESTRADIOL 0.1 MG/GM VA CREA
TOPICAL_CREAM | VAGINAL | Status: DC | PRN
  Administered 2015-10-16: 1 via VAGINAL

## 2015-10-16 MED ORDER — HEPARIN SODIUM (PORCINE) 5000 UNIT/ML IJ SOLN
INTRAMUSCULAR | Status: AC
  Filled 2015-10-16: qty 1

## 2015-10-16 SURGICAL SUPPLY — 55 items
APL SRG 38 LTWT LNG FL B (MISCELLANEOUS) ×3
APPLICATOR ARISTA FLEXITIP XL (MISCELLANEOUS) ×1 IMPLANT
CABLE HIGH FREQUENCY MONO STRZ (ELECTRODE) IMPLANT
CLOTH BEACON ORANGE TIMEOUT ST (SAFETY) ×4 IMPLANT
CONT PATH 16OZ SNAP LID 3702 (MISCELLANEOUS) ×4 IMPLANT
COVER BACK TABLE 60X90IN (DRAPES) ×4 IMPLANT
COVER MAYO STAND STRL (DRAPES) ×4 IMPLANT
DECANTER SPIKE VIAL GLASS SM (MISCELLANEOUS) IMPLANT
DRSG COVADERM PLUS 2X2 (GAUZE/BANDAGES/DRESSINGS) ×7 IMPLANT
DRSG OPSITE POSTOP 3X4 (GAUZE/BANDAGES/DRESSINGS) ×1 IMPLANT
DURAPREP 26ML APPLICATOR (WOUND CARE) ×4 IMPLANT
ELECT REM PT RETURN 9FT ADLT (ELECTROSURGICAL)
ELECTRODE REM PT RTRN 9FT ADLT (ELECTROSURGICAL) IMPLANT
EVACUATOR SMOKE 8.L (FILTER) ×4 IMPLANT
FILTER SMOKE EVAC LAPAROSHD (FILTER) ×1 IMPLANT
GAUZE PACKING 1 X5 YD ST (GAUZE/BANDAGES/DRESSINGS) ×1 IMPLANT
GLOVE BIOGEL PI IND STRL 7.0 (GLOVE) ×9 IMPLANT
GLOVE BIOGEL PI IND STRL 8 (GLOVE) ×3 IMPLANT
GLOVE BIOGEL PI INDICATOR 7.0 (GLOVE) ×3
GLOVE BIOGEL PI INDICATOR 8 (GLOVE) ×1
GLOVE ECLIPSE 7.5 STRL STRAW (GLOVE) ×8 IMPLANT
HEMOSTAT ARISTA ABSORB 3G PWDR (MISCELLANEOUS) ×1 IMPLANT
LEGGING LITHOTOMY PAIR STRL (DRAPES) ×4 IMPLANT
LIQUID BAND (GAUZE/BANDAGES/DRESSINGS) ×1 IMPLANT
NS IRRIG 1000ML POUR BTL (IV SOLUTION) ×4 IMPLANT
PACK LAVH (CUSTOM PROCEDURE TRAY) ×4 IMPLANT
PACK ROBOTIC GOWN (GOWN DISPOSABLE) ×4 IMPLANT
PAD TRENDELENBURG POSITION (MISCELLANEOUS) ×4 IMPLANT
SET CYSTO W/LG BORE CLAMP LF (SET/KITS/TRAYS/PACK) IMPLANT
SET IRRIG TUBING LAPAROSCOPIC (IRRIGATION / IRRIGATOR) ×4 IMPLANT
SHEARS HARMONIC ACE PLUS 36CM (ENDOMECHANICALS) ×4 IMPLANT
SLEEVE XCEL OPT CAN 5 100 (ENDOMECHANICALS) ×4 IMPLANT
STRIP CLOSURE SKIN 1/4X3 (GAUZE/BANDAGES/DRESSINGS) IMPLANT
SUT VIC AB 0 CT1 18XCR BRD8 (SUTURE) ×6 IMPLANT
SUT VIC AB 0 CT1 27 (SUTURE)
SUT VIC AB 0 CT1 27XBRD ANBCTR (SUTURE) IMPLANT
SUT VIC AB 0 CT1 36 (SUTURE) ×4 IMPLANT
SUT VIC AB 0 CT1 8-18 (SUTURE) ×8
SUT VIC AB 2-0 SH 27 (SUTURE)
SUT VIC AB 2-0 SH 27XBRD (SUTURE) IMPLANT
SUT VIC AB 3-0 CT1 27 (SUTURE)
SUT VIC AB 3-0 CT1 TAPERPNT 27 (SUTURE) IMPLANT
SUT VIC AB 3-0 SH 27 (SUTURE)
SUT VIC AB 3-0 SH 27X BRD (SUTURE) IMPLANT
SUT VICRYL 0 TIES 12 18 (SUTURE) ×4 IMPLANT
SUT VICRYL 0 UR6 27IN ABS (SUTURE) ×4 IMPLANT
SUT VICRYL RAPIDE 3 0 (SUTURE) ×8 IMPLANT
SYR BULB IRRIGATION 50ML (SYRINGE) ×4 IMPLANT
TOWEL OR 17X24 6PK STRL BLUE (TOWEL DISPOSABLE) ×8 IMPLANT
TRAY FOLEY CATH SILVER 14FR (SET/KITS/TRAYS/PACK) ×4 IMPLANT
TROCAR BALLN 12MMX100 BLUNT (TROCAR) IMPLANT
TROCAR XCEL NON-BLD 11X100MML (ENDOMECHANICALS) ×4 IMPLANT
TROCAR XCEL NON-BLD 5MMX100MML (ENDOMECHANICALS) ×4 IMPLANT
WARMER LAPAROSCOPE (MISCELLANEOUS) ×4 IMPLANT
WATER STERILE IRR 1000ML POUR (IV SOLUTION) ×4 IMPLANT

## 2015-10-16 NOTE — H&P (View-Only) (Signed)
[name] is an 45 y.o. female. Here for preop examination. Her history is as follows:  She was seen in the office for her annual exam on September 30 and then been complaining at that time of low abdominal discomfort. Patient with past history of fibroid uterus. Patient was asymptomatic today. Patient's had a previous tubal ligation procedure.  Ultrasound Oct 2016: Uterus measured 8.3 x 6.6 x 5.6 cm with endometrial stripe of 6.9 mm (last picture. 06/07/2015). Patient with 3 intramural fibroids the largest one measuring 29 x 26 mm. Right ovary was located behind the uterine wall but normal in appearance. A thin-walled left ovarian cyst which was echo-free was seen negative color flow. A 5 x 4 cm cluster of 3 solid calcified foci at the wall of the cyst was noted. No fluid in the cul-de-sac CA 125 was 9.  Ultrasound  09/14/2015: Uterus measures 7.4 x 6.8 x 5.5 cm endometrial stripe 3.3 mm. Patient with 3 intramural fibroids the largest one measuring 31 x 31 mm. Endometrium filled with fluid 11 x 4 mm. Right ovary was normal. Left ovary thinwall echo-free avascular cyst measuring 25 x 21 x 20 mm with calcified focus in the wall of the cyst measuring 3 x 5 mm negative fluid in the cul-de-sac.  Patient with past history of endometriosis, history of endometrial ablation continues to suffer from dysmenorrhea menorrhagia and left lower quadrant pain and persistent left ovarian cyst  Is  scheduled ifor laparoscopic-assisted vaginal hysterectomy along with left salpingo-oophorectomy and right salpingectomy    Pertinent Gynecological History: Menses: with severe dysmenorrhea, heavy Bleeding: see above Contraception: tubal ligation DES exposure: unknown Blood transfusions: post partum Sexually transmitted diseases: no past history Previous GYN Procedures: Cryoablation of uterus, laparoscopy, csection/BTL Last mammogram: normal Date: 2015 Last pap: normal Date: 2015 OB History: G2,  P2   Menstrual History: Menarche age: 81 Patient's last menstrual period was 09/11/2015.    Past Medical History  Diagnosis Date  . HELLP (hemolytic anemia/elev liver enzymes/low platelets in pregnancy)     hx of two pregnancies  . Normal spontaneous vaginal delivery   . Miscarriage   . Endometriosis   . Acne   . Thyroid dysfunction     Past Surgical History  Procedure Laterality Date  . Cesarean section    . Tubal ligation    . Dilation and curettage of uterus      Family History  Problem Relation Age of Onset  . Diabetes Mother   . Hypertension Mother   . Thyroid disease Mother   . Cancer Mother     THYROID    Social History:  reports that she has never smoked. She has never used smokeless tobacco. She reports that she drinks alcohol. She reports that she does not use illicit drugs.  Allergies:  Allergies  Allergen Reactions  . Iodine Anaphylaxis  . Shellfish Allergy Anaphylaxis     (Not in a hospital admission)  REVIEW OF SYSTEMS: A ROS was performed and pertinent positives and negatives are included in the history.  GENERAL: No fevers or chills. HEENT: No change in vision, no earache, sore throat or sinus congestion. NECK: No pain or stiffness. CARDIOVASCULAR: No chest pain or pressure. No palpitations. PULMONARY: No shortness of breath, cough or wheeze. GASTROINTESTINAL: No abdominal pain, nausea, vomiting or diarrhea, melena or bright red blood per rectum. GENITOURINARY: No urinary frequency, urgency, hesitancy or dysuria. MUSCULOSKELETAL: No joint or muscle pain, no back pain, no recent trauma. DERMATOLOGIC: No rash, no  itching, no lesions. ENDOCRINE: No polyuria, polydipsia, no heat or cold intolerance. No recent change in weight. HEMATOLOGICAL: No anemia or easy bruising or bleeding. NEUROLOGIC: No headache, seizures, numbness, tingling or weakness. PSYCHIATRIC: No depression, no loss of interest in normal activity or change in sleep pattern.     Blood  pressure 128/86, last menstrual period 09/11/2015.  Physical Exam:  HEENT:unremarkable Neck:Supple, midline, no thyroid megaly, no carotid bruits Lungs:  Clear to auscultation no rhonchi's or wheezes Heart:Regular rate and rhythm, no murmurs or gallops Breast Exam: Symmetrical in appearance no palpable mass or tenderness no supraclavicular axillary lymphadenopathy Abdomen: Soft nontender no rebound or guarding Pelvic:BUS within normal limits Vagina: No lesions or discharge Cervix: No lesions or discharge Uterus: Anteverted 8 weeks size and slightly irregular Adnexa: No palpable masses or tenderness Extremities: No cords, no edema Rectal: Unremarkable  No results found for this or any previous visit (from the past 24 hour(s)).  No results found.  Assessment/Plan:Patient with past history of endometriosis, history of endometrial ablation continues to suffer from dysmenorrhea menorrhagia and left lower quadrant pain and persistent left ovarian cyst  Is  scheduled ifor laparoscopic-assisted vaginal hysterectomy along with left salpingo-oophorectomy and right salpingectomy the following risk were discussed with the patient in reference to her surgery:                        Patient was counseled as to the risk of surgery to include the following:  1. Infection (prohylactic antibiotics will be administered)  2. DVT/Pulmonary Embolism (prophylactic pneumo compression stockings will be used)  3.Trauma to internal organs requiring additional surgical procedure to repair any injury to     Internal organs requiring perhaps additional hospitalization days.  4.Hemmorhage requiring transfusion and blood products which carry risks such as             anaphylactic reaction, hepatitis and AIDS  Patient had received literature information on the procedure scheduled and all her questions were answered and fully accepts all risk.   [facility] HMD7:51 AMTD@Note :        Note: This  dictation was prepared with  Dragon/digital dictation along Games developer. Any transcriptional errors that result from this process are unintentional.

## 2015-10-16 NOTE — Anesthesia Postprocedure Evaluation (Signed)
Anesthesia Post Note  Patient: Jalen Garg  Procedure(s) Performed: Procedure(s) (LRB): LAPAROSCOPIC ASSISTED VAGINAL HYSTERECTOMY WITH SALPINGO OOPHORECTOMY (Left) RIGHT  SALPINGECTOMY (Right) CYSTOSCOPY (N/A)  Patient location during evaluation: [facility] Anesthesia Type: General Level of consciousness: awake and alert and oriented Pain management: satisfactory to patient Vital Signs Assessment: post-procedure vital signs reviewed and stable Respiratory status: nonlabored ventilation, respiratory function stable and spontaneous breathing Cardiovascular status: stable Postop Assessment: no signs of nausea or vomiting and adequate PO intake Anesthetic complications: no    Last Vitals:  Filed Vitals:   10/16/15 1708 10/16/15 1806  BP: 111/59   Pulse: 56   Temp: 36.4 C   Resp: 18 19    Last Pain:  Filed Vitals:   10/16/15 1828  PainSc: 3                  Nicoli Nardozzi

## 2015-10-16 NOTE — Anesthesia Postprocedure Evaluation (Signed)
Anesthesia Post Note  Patient: [name]  Procedure(s) Performed: Procedure(s) (LRB): LAPAROSCOPIC ASSISTED VAGINAL HYSTERECTOMY WITH SALPINGO OOPHORECTOMY (Left) RIGHT  SALPINGECTOMY (Right) CYSTOSCOPY (N/A)  Patient location during evaluation: PACU Anesthesia Type: General Level of consciousness: sedated Pain management: pain level controlled Vital Signs Assessment: post-procedure vital signs reviewed and stable Respiratory status: spontaneous breathing and respiratory function stable Cardiovascular status: stable Anesthetic complications: no    Last Vitals:  Filed Vitals:   10/16/15 1245 10/16/15 1300  BP: 110/71 112/70  Pulse: 72 70  Temp:  36.4 C  Resp: 15 18    Last Pain:  Filed Vitals:   10/16/15 1306  PainSc: 3                  Ameen Mostafa DANIEL

## 2015-10-16 NOTE — Anesthesia Procedure Notes (Signed)
Procedure Name: Intubation Date/Time: 10/16/2015 9:33 AM Performed by: Casimer Lanius A Pre-anesthesia Checklist: Patient identified, Emergency Drugs available, Suction available and Patient being monitored Patient Re-evaluated:Patient Re-evaluated prior to inductionOxygen Delivery Method: Circle system utilized and Simple face mask Preoxygenation: Pre-oxygenation with 100% oxygen Intubation Type: IV induction and Combination inhalational/ intravenous induction Ventilation: Mask ventilation without difficulty Laryngoscope Size: Mac and 3 Grade View: Grade III Tube type: Oral Tube size: 7.0 mm Number of attempts: 1 Airway Equipment and Method: Stylet Placement Confirmation: ETT inserted through vocal cords under direct vision,  positive ETCO2 and breath sounds checked- equal and bilateral Secured at: 20 (right lip) cm Tube secured with: Tape Dental Injury: Teeth and Oropharynx as per pre-operative assessment

## 2015-10-16 NOTE — Interval H&P Note (Signed)
History and Physical Interval Note:  10/16/2015 8:50 AM  [name]  has presented today for surgery, with the diagnosis of symptomatic leiomyomatous uteri, menorrhagia, dysparenuia  The various methods of treatment have been discussed with the patient and family. After consideration of risks, benefits and other options for treatment, the patient has consented to  Procedure(s): LAPAROSCOPIC ASSISTED VAGINAL HYSTERECTOMY WITH SALPINGO OOPHORECTOMY (Left) RIGHT  SALPINGECTOMY (Right) CYSTOSCOPY (N/A) as a surgical intervention .  [name] history has been reviewed, patient examined, no change in status, stable for surgery.  I have reviewed the patient's chart and labs.  Questions were answered to the patient's satisfaction.     Terrance Mass

## 2015-10-16 NOTE — Transfer of Care (Signed)
Immediate Anesthesia Transfer of Care Note  Patient: [name]  Procedure(s) Performed: Procedure(s): LAPAROSCOPIC ASSISTED VAGINAL HYSTERECTOMY WITH SALPINGO OOPHORECTOMY (Left) RIGHT  SALPINGECTOMY (Right) CYSTOSCOPY (N/A)  Patient Location: PACU  Anesthesia Type:General  Level of Consciousness: awake  Airway & Oxygen Therapy: Patient Spontanous Breathing  Post-op Assessment: Report given to PACU RN  Post vital signs: stable  Filed Vitals:   10/16/15 0834  BP: 135/85  Pulse: 68  Temp: 36.4 C  Resp: 20    Complications: No apparent anesthesia complications

## 2015-10-16 NOTE — Op Note (Signed)
Operative Note  10/16/2015  12:01 PM  PATIENT:  [name]  45 y.o. female  PRE-OPERATIVE DIAGNOSIS:  symptomatic leiomyomatous uteri, menorrhagia, dysparenuia, persistent L ovarian cyst with calcification  POST-OPERATIVE DIAGNOSIS:  symptomatic leiomyomatous uteri, menorrhagia, dysparenuia, persistent L ovarian cyst with calcification  PROCEDURE:  Procedure(s): LAPAROSCOPIC ASSISTED VAGINAL HYSTERECTOMY WITH SALPINGO OOPHORECTOMY RIGHT  SALPINGECTOMY CYSTOSCOPY  SURGEON:  Surgeon(s): Terrance Mass, MD Anastasio Auerbach, MD  ANESTHESIA:   general  FINDINGS: Large fundal intramural myoma. A large left fallopian tube measuring 3 x 5 cm smooth surface no excrescences noted. Evidence of bilateral tubal ligation noted. No evidence of peritoneal endometriosis. Normal-appearing appendix. Normal smooth liver surface and gallbladder seen. Scarring noted near the lower portion of the uterus near the cervix from previous cesarean section.  DESCRIPTION OF OPERATION:After adequate general endotracheal anesthesia, the patient was placed in dorsal lithotomy position, prepped and draped in the usual manner for a laparoscopic procedure. Patient received her prophylactic antibiotic and had PAS stockings in place as well.  A time out was undertaken for proper identification of the patient and procedure to be performed. A speculum was placed into the vagina. A bimanual exam was performed and then then  A single tooth tenaculum was utilized to grasp the anterior lip of the uterine cervix. The uterus was sounded to  8 1/2 cm The single-tooth tenaculum and speculum were removed from the vagina. A foley catheter was inserted to monitor urinary output.  At this time, an infraumbilical  skin incision was made through which a 10/11-mm  Opti-View trocar was introduced through the infraumbilical incision into the abdominal cavity. A pneumoperitoneum was established  With CO2 for approximately 2 1/2 liters.  Through the trocar sheath, the laparoscope was inserted and adequate visualization of the pelvic structures was noted. Two  5-mm skin incision were made on the patients right and left lower abdomin under laparoscopic guidance and  two  5-mm trocars were  introduced into the abdominal cavity for instrumentation. Evaluation of the pelvis revealed:  Large fundal intramural myoma. A large left fallopian tube measuring 3 x 5 cm smooth surface no excrescences noted. Evidence of bilateral tubal ligation noted. No evidence of peritoneal endometriosis. Normal-appearing appendix. Normal smooth liver surface and gallbladder seen. Scarring noted near the lower portion of the uterus near the cervix from previous cesarean section.  . The ureters were noted to be deep in the pelvis. At this time, the right cornu was grasped and the right fallopian tube, uteroovarian ligament, and round ligaments were coaptated and transected  With the Harmonic Scalpel without difficulty. The remainder of the uterine vessels and anterior and posterior leaves of the broad ligament, as well as the cardinal ligaments were  transected in a serial fashion down to level of the uterine artery. On the left side the left tube and ovary were planned to be removed. The left infundibulopelvic ligament was identified and proximal to the ovary it was coaptated and transected with Harmonic scalpel. The round ligament was then coaptated and transected. The remainder of the cardinal ligament, uterine vessels, anterior, and posterior sheaths of the broad ligament were coaptated  and transected with the Harmonic Scalpel  in a serial manner to the level of the uterine artery. The anterior leaf of the broad ligament was then dissected to the midline bilaterally, establishing a bladder flap with a combination of blunt and sharp dissection. At this time, attention was made to the vaginal hysterectomy. The laparoscope was removed and attention  was made to the vaginal  hysterectomy.  Hulka Tenaculum  was removed and the anterior and posterior leafs of the cervix were grasped with Lahey tenaculum. A circumferential injection with  1% Lidocaine with 1:100,000  Epinephrine dilution  Was injected into  the cervicovaginal portio for 20 cc's. . A circumferential incision was then made at the cervicovaginal portio. The anterior and posterior colpotomies were accomplished with a combination of blunt and sharp dissection without difficulty. The right uterosacral ligament was clamped, transected, and ligated with #0 Vicryl sutures. The left uterosacral ligament was clamped, transected, and ligated with #0 Vicryl suture. The parametrial tissue was then clamped bilaterally, transected, and ligated with #0 Vicryl suture bilaterally. The uterus, cervix, left tube and ovary was then removed and passed off the operative field. Laparotomy pack was placed into the pelvis. The pedicles were evaluated. At this point the vaginal cuff was grasped with Allis clamps in an effort to form a pneumoperitoneum to go from above for better inspection of the possible bleeding had a cold.  The abdomen was reinsufflated. A systematic inspection of the vaginal cuff demonstrated no further bleeding some slight tissue friability was noted and Arista hemostatic agent was applied to the vaginal cuff. Prior to doing this the right fallopian tube had been identified in the mesosalpinx was coaptated and transected and the right fallopian tube was retrieved and passed off for histological evaluation. The 5 mm trocar she's were then removed under laparoscopic visualization after the pneumoperitoneum was removed. The laparoscope was then removed. The subumbilical fascia incision was closed with a figure-of-eight of 0 Vicryl suture and all 3 incision ports skin edges were reapproximated with Dermabond glue. For additional postoperative analgesia 0.25% Marcaine was infiltrated all 3 incision ports for a total of 10  cc.  Attention was then placed back to the vagina.The uterosacral ligaments were suture fixated into the vaginal cuff angles with #0 Vicryl sutures. The vaginal cuff was then closed Hemostasis was noted throughout. The posterior peritoneum was secured to the vagina with a running stitch of 0-Vicryl from 3-9 o'clock. The vaginal cuff was then closed with interrupted figure of eights with 0-Vicryl. A vaginal packing with Estrace was placed in the vagina for additional hemostasis. A small tear in the posterior vaginal mucosa was reapproximated with a figure-of-eight of 0 Vicryl suture.  Cystoscopy was accomplished in the following fashion. A 70 cystoscope was introduced into the bladder with utilization of normal saline. Patient had been given Pyridium 200 mg the night before the surgery and the morning of the surgery an effort to identify ureteral patency. Inspection of the bladder mucosa demonstrated no evidence of any injury mucosa was intact and both ureteral orifice demonstrated patency with ejection of urine. The patient was then extubated and transferred to recovery room in stable vital signs.   ESTIMATED BLOOD LOSS: 350 cc  Intake/Output Summary (Last 24 hours) at 10/16/15 1201 Last data filed at 10/16/15 1125  Gross per 24 hour  Intake   2100 ml  Output    650 ml  Net   1450 ml     BLOOD ADMINISTERED:none   LOCAL MEDICATIONS USED:  MARCAINE   0.25% was infiltrated at the 3 incision ports for a total 10 cc for postoperative analgesia  SPECIMEN:  Source of Specimen:  Uterus, cervix, left fallopian tube and ovary, right fallopian tube  DISPOSITION OF SPECIMEN:  PATHOLOGY  COUNTS:  YES  PLAN OF CARE: Transfer to [place] HMD12:01 PMTD@

## 2015-10-16 NOTE — Anesthesia Preprocedure Evaluation (Addendum)
Anesthesia Evaluation  Patient identified by MRN, date of birth, ID band Patient awake    Reviewed: Allergy & Precautions, NPO status , Patient's Chart, lab work & pertinent test results  Airway Mallampati: II  TM Distance: >3 FB Neck ROM: Full    Dental  (+) Dental Advisory Given   Pulmonary neg pulmonary ROS,    breath sounds clear to auscultation       Cardiovascular negative cardio ROS   Rhythm:Regular Rate:Normal     Neuro/Psych negative neurological ROS     GI/Hepatic negative GI ROS, Neg liver ROS,   Endo/Other  negative endocrine ROS  Renal/GU negative Renal ROS     Musculoskeletal negative musculoskeletal ROS (+)   Abdominal   Peds  Hematology negative hematology ROS (+)   Anesthesia Other Findings   Reproductive/Obstetrics                            Lab Results  Component Value Date   WBC 8.8 10/04/2015   HGB 13.4 10/04/2015   HCT 39.1 10/04/2015   MCV 92.2 10/04/2015   PLT 251 10/04/2015   Lab Results  Component Value Date   CREATININE 0.78 05/25/2015   BUN 18 05/25/2015   NA 141 05/25/2015   K 4.2 05/25/2015   CL 103 05/25/2015   CO2 28 05/25/2015    Anesthesia Physical Anesthesia Plan  ASA: II  Anesthesia Plan: General   Post-op Pain Management:    Induction: Intravenous  Airway Management Planned: Oral ETT  Additional Equipment:   Intra-op Plan:   Post-operative Plan: Extubation in OR  Informed Consent: I have reviewed the patients History and Physical, chart, labs and discussed the procedure including the risks, benefits and alternatives for the proposed anesthesia with the patient or authorized representative who has indicated his/her understanding and acceptance.   Dental advisory given  Plan Discussed with: CRNA  Anesthesia Plan Comments:         Anesthesia Quick Evaluation

## 2015-10-16 NOTE — Addendum Note (Signed)
Addendum  created 10/16/15 1930 by Jonna Munro, CRNA   Modules edited: Clinical Notes   Clinical Notes:  File: [id number]

## 2015-10-17 ENCOUNTER — Encounter (HOSPITAL_COMMUNITY): Payer: Self-pay | Admitting: Gynecology

## 2015-10-17 DIAGNOSIS — D251 Intramural leiomyoma of uterus: Secondary | ICD-10-CM | POA: Diagnosis not present

## 2015-10-17 LAB — CBC
HEMATOCRIT: 31.1 % — AB (ref 36.0–46.0)
HEMOGLOBIN: 10.4 g/dL — AB (ref 12.0–15.0)
MCH: 30.6 pg (ref 26.0–34.0)
MCHC: 33.4 g/dL (ref 30.0–36.0)
MCV: 91.5 fL (ref 78.0–100.0)
Platelets: 201 10*3/uL (ref 150–400)
RBC: 3.4 MIL/uL — ABNORMAL LOW (ref 3.87–5.11)
RDW: 13.7 % (ref 11.5–15.5)
WBC: 13.5 10*3/uL — ABNORMAL HIGH (ref 4.0–10.5)

## 2015-10-17 NOTE — Progress Notes (Signed)

## 2015-10-17 NOTE — Discharge Summary (Signed)
Physician Discharge Summary  Patient ID: [name] MRN: [id number] DOB/AGE: 1971/08/25 45 y.o.  Admit date: 10/16/2015 Discharge date: 10/17/2015  Admission Diagnoses: Symptomatic leiomyomatous uteri, left ovarian cyst  Discharge Diagnoses: Symptomatic leiomyomatous uteri, left ovarian cyst Active Problems:   Postoperative state   Discharged Condition: good  Hospital Course: The patient was admitted to the hospital February 21 which she underwent a laparoscopic-assisted vaginal hysterectomy with left salpingo-oophorectomy and right salpingectomy as a result of dysmenorrhea, menorrhagia, pelvic pain, fibroid uterus and past history of endometriosis. Patient did well from her surgery. Her preoperative hemoglobin was 13.4 postop hemoglobin was 10.4. Patient had a vaginal pack that was removed within 12 hours postop. Her Foley catheter was removed within 12 hours. Patient has been up and ambulating and has voided spontaneously. Stable vital signs. Patient's tolerated diet and is rated be discharged home. She has remained afebrile. Pathology report pending.  Consults: None  Significant Diagnostic Studies: labs: 13.4 preop 10.4 postop hemoglobin  Treatments: surgery: Laparoscopic-assisted vaginal hysterectomy with left salpingo-oophorectomy and right salpingectomy  Discharge Exam: Blood pressure 102/51, pulse 63, temperature 98.6 F (37 C), temperature source Oral, resp. rate 18, height 5\' 7"  (1.702 m), weight 175 lb (79.379 kg), SpO2 98 %. General appearance: alert and cooperative Resp: clear to auscultation bilaterally Cardio: regular rate and rhythm, S1, S2 normal, no murmur, click, rub or gallop GI: soft, non-tender; bowel sounds normal; no masses,  no organomegaly Extremities: extremities normal, atraumatic, no cyanosis or edema Incision/Wound:  Disposition: 01-Home or Self Care  Discharge Instructions    Call MD for:  difficulty breathing, headache or visual disturbances     Complete by:  As directed      Call MD for:  hives    Complete by:  As directed      Call MD for:  persistant dizziness or light-headedness    Complete by:  As directed      Call MD for:  persistant nausea and vomiting    Complete by:  As directed      Call MD for:  redness, tenderness, or signs of infection (pain, swelling, redness, odor or green/yellow discharge around incision site)    Complete by:  As directed      Call MD for:  severe uncontrolled pain    Complete by:  As directed      Call MD for:  temperature >100.4    Complete by:  As directed      Diet general    Complete by:  As directed      Discharge instructions    Complete by:  As directed   Postop appointment with Dr. Toney Rakes March 8th at 8 am Hysterectomy, Abdominal & Vaginal Care After Refer to this sheet in the next few weeks. These discharge instructions provide you with general information on caring for yourself after you leave the hospital. Your caregiver may also give you specific instructions. Your treatment has been planned according to the most current medical practices available, but unavoidable complications sometimes occur. If you have any problems or questions after discharge, please call your caregiver. HOME CARE INSTRUCTIONS Healing will take time. You will have tenderness at the surgery site. There may be some swelling and bruising around this area if you had an abdominal hysterectomy. Have an adult stay with you the first 48 to 72 hours after surgery, and then for 1 to 2 weeks afterward to help with daily activities. Only take over-the-counter or prescription medicines for pain, discomfort, or fever  as directed by your caregiver.  Do not take aspirin. It can cause bleeding.  Do not drive when taking pain medicine.  It will be normal to be sore for a couple weeks after surgery. See your caregiver if this seems to be getting worse rather than better.  Follow your caregiver's advice regarding diet, exercise,  lifting, driving, and general activities.  Take showers instead of baths for a few weeks as directed.  You may resume your usual diet as directed.  Get plenty of rest and sleep.  Do not douche, use tampons, or have sexual intercourse until your caregiver says it is okay.  Change your bandages (dressings) as directed if you had an abdominal hysterectomy.  Take your temperature twice a day and write it down.  Do not drink alcohol until your caregiver says it is okay.  If you develop constipation, you may take a mild laxative with your caregiver's permission. Eating bran foods helps with constipation problems. Drink enough water and fluids to keep your urine clear or pale yellow.  Do not sign any legal documents until you feel normal again.  Keep all of your follow-up appointments.  Make sure you and your family understand everything about your operation and recovery.  SEEK MEDICAL CARE IF: There is swelling, redness, or increasing pain in the wound area.  Fluid (pus) is coming from the wound.  You notice a bad smell coming from the wound or surgical dressing.  You have pain, redness, and swelling from the intravenous (IV) site.  The wound breaks open.  You feel dizzy.  You develop pain or bleeding when you urinate.  You develop diarrhea.  You feel sick to your stomach (nauseous) and throw up (vomit).  You develop abnormal vaginal discharge.  You develop a rash.  You have any type of abnormal reaction or develop an allergy to your medicine.  Your pain medicine is not relieving your pain.  seek immediate medical care if: You have an oral temperature above 100, not controlled by medicine. HYYou develop chest pain.  You develop shortness of breath.  You pass out (faint).  You develop pain, swelling, or redness of your leg.  You develop heavy vaginal bleeding, with or without blood clots.  MAKE SURE YOU: Understand these instructions.  Will watch your condition.  Will get help right away  if you are not doing well or get worse.  Document Released: 11/01/2003 Document Re-Released: 01/29/2010 [facility] Patient Information 2011 [place].   [facility] HMD7:39 AMTD@     Driving Restrictions    Complete by:  As directed   1 week     Increase activity slowly    Complete by:  As directed      Lifting restrictions    Complete by:  As directed   6 weeks     Sexual Activity Restrictions    Complete by:  As directed   6 weeks            Medication List    STOP taking these medications        ampicillin 500 MG capsule  Commonly known as:  PRINCIPEN     fluconazole 100 MG tablet  Commonly known as:  DIFLUCAN     fluconazole 150 MG tablet  Commonly known as:  DIFLUCAN     phenazopyridine 200 MG tablet  Commonly known as:  PYRIDIUM      TAKE these medications        CALCIUM 1000 + D PO  Take 1 tablet by mouth daily.     cholecalciferol 1000 units tablet  Commonly known as:  VITAMIN D  Take 1,000 Units by mouth daily.     clindamycin 1 % lotion  Commonly known as:  CLEOCIN T  Apply 1 application topically daily. Reported on 10/11/2015     Coconut Oil 1000 MG Caps  Take by mouth.     EPINEPHrine 0.3 mg/0.3 mL Soaj injection  Commonly known as:  EPI-PEN  Inject 0.3 mLs (0.3 mg total) into the muscle once.     MAG-200 PO  Take by mouth.     metoCLOPramide 10 MG tablet  Commonly known as:  REGLAN  Take 1 tablet (10 mg total) by mouth 3 (three) times daily with meals.     oxyCODONE-acetaminophen 5-325 MG tablet  Commonly known as:  PERCOCET  Take 1 tablet by mouth every 4 (four) hours as needed for severe pain.     ZINC PO  Take by mouth.         SignedTerrance Mass 10/17/2015, 7:40 AM

## 2015-10-23 ENCOUNTER — Telehealth: Payer: Self-pay

## 2015-10-23 ENCOUNTER — Other Ambulatory Visit: Payer: Self-pay | Admitting: Gynecology

## 2015-10-23 MED ORDER — METRONIDAZOLE 500 MG PO TABS: 500.0000 mg | ORAL_TABLET | Freq: Two times a day (BID) | ORAL | Status: DC

## 2015-10-23 NOTE — Telephone Encounter (Signed)
Patient informed.  Patient asked if the odor is normal as well. She said it is fishy odor and very strong.

## 2015-10-23 NOTE — Telephone Encounter (Signed)
Patient informed. Rx sent 

## 2015-10-23 NOTE — Telephone Encounter (Signed)
Patient had LAVH with Dr. Moshe Salisbury last Tuesday 10/16/15. She called today complaining of vaginal "leakage" with odor.  She said on the pad there is some brown and a little red.  Rec?  The only other symptoms are a few sharp pains inside every now and then. Taking the oxycodone.  Sometimes feels a little pressure "but might be gas". No UTI symptoms.

## 2015-10-23 NOTE — Telephone Encounter (Signed)
Not unusual to have a vaginal discharge with surgery. It is secondary to having sutures placed in the vagina as well as the healing process.  It can be anywhere from yellow to bloody. As long as it's not a lot and she has no other symptoms like worsening pain, issues with urination or bowel movements, fever or chills then I would monitor at this point. She should have an appointment to see Dr. Toney Rakes regardless for her two-week checkup. I would recommend office visit sooner though if there are any concerning issues.

## 2015-10-23 NOTE — Telephone Encounter (Signed)
Sometimes patients can get a shift in bacteria that can cause the odor.  Again if everything else is okay we can try a course of Flagyl 500 mg twice a day 7 days which may address this. Avoid alcohol while taking.

## 2015-10-30 ENCOUNTER — Telehealth: Payer: Self-pay

## 2015-10-30 ENCOUNTER — Ambulatory Visit: Payer: BLUE CROSS/BLUE SHIELD | Admitting: Gynecology

## 2015-10-30 NOTE — Telephone Encounter (Signed)
Patient said she has class but still with some discomfort from surgery on 10/12/15. Does not feel like sitting in her two classes that are hours long in the evening. She asked for a note to excuse her from class last week 2/28 and tonight 10/30/15.  She feels like she will be able to go next week. Letters provided.

## 2015-10-31 ENCOUNTER — Ambulatory Visit: Payer: BLUE CROSS/BLUE SHIELD | Admitting: Gynecology

## 2015-11-01 ENCOUNTER — Ambulatory Visit (INDEPENDENT_AMBULATORY_CARE_PROVIDER_SITE_OTHER): Payer: BLUE CROSS/BLUE SHIELD | Admitting: Gynecology

## 2015-11-01 ENCOUNTER — Encounter: Payer: Self-pay | Admitting: Gynecology

## 2015-11-01 VITALS — BP 120/76

## 2015-11-01 DIAGNOSIS — Z09 Encounter for follow-up examination after completed treatment for conditions other than malignant neoplasm: Secondary | ICD-10-CM

## 2015-11-01 MED ORDER — EPINEPHRINE 0.3 MG/0.3ML IJ SOAJ: 0.3000 mg | Freq: Once | INTRAMUSCULAR | Status: DC

## 2015-11-01 NOTE — Progress Notes (Signed)
    [name] 1971/04/23 [id number]        45 y.o.  [id number] presents for her first postoperative visit status post LAVH left salpingo-oophorectomy right salpingectomy. Doing well. Does note some vaginal spotting daily. No pain or other issues.  Past medical history,surgical history, problem list, medications, allergies, family history and social history were all reviewed and documented in the EPIC chart.  Directed ROS with pertinent positives and negatives documented in the history of present illness/assessment and plan.  Exam: [name] assistant Filed Vitals:   11/01/15 1021  BP: 120/76   General appearance:  Normal Abdomen soft nontender without masses guarding rebound. Abdominal incisions healed nicely Pelvic external BUS vagina with cuff healing nicely sutures intact. Small spotting area at the mid cuff. Silver nitrate applied. Healing posterior vaginal wall suture line. Bimanual without masses or tenderness   Assessment/Plan:  45 y.o. [name]:[id number] with normal postoperative visit. Small spotting area mid cuff, silver nitrate applied. Reviewed pathology with her that showed leiomyoma. Patient will slowly resume routine activities with the avoidance of exercise and anything in the vagina. Follow up in 2 weeks for her next postoperative visit.    Kimberly Auerbach MD, 10:41 AM 11/01/2015

## 2015-11-01 NOTE — Patient Instructions (Signed)
Follow up in 2 weeks for your next postoperative visit. Continue with nothing in the vagina.

## 2015-11-13 ENCOUNTER — Other Ambulatory Visit: Payer: Self-pay

## 2015-11-13 DIAGNOSIS — Z1231 Encounter for screening mammogram for malignant neoplasm of breast: Secondary | ICD-10-CM

## 2015-11-15 ENCOUNTER — Ambulatory Visit (INDEPENDENT_AMBULATORY_CARE_PROVIDER_SITE_OTHER): Payer: BLUE CROSS/BLUE SHIELD | Admitting: Gynecology

## 2015-11-15 ENCOUNTER — Encounter: Payer: Self-pay | Admitting: Gynecology

## 2015-11-15 VITALS — BP 118/76

## 2015-11-15 DIAGNOSIS — Z9889 Other specified postprocedural states: Secondary | ICD-10-CM

## 2015-11-15 NOTE — Progress Notes (Signed)
    [name] July 09, 1971 [id number]        45 y.o.  [id number] presents for her 1 month follow up postoperative visit status post LAVH LSO right salpingectomy. Overall doing well but notes some vaginal bleeding on and off.  Not having significant pain or other symptoms.  Past medical history,surgical history, problem list, medications, allergies, family history and social history were all reviewed and documented in the EPIC chart.  Directed ROS with pertinent positives and negatives documented in the history of present illness/assessment and plan.  Exam: [name] assistant Filed Vitals:   11/15/15 0810  BP: 118/76   General appearance:  Normal Abdomen soft nontender without masses guarding rebound. Incisions healed nicely. Pelvic external BUS vagina with cuff intact. Slight oozing area right cuff mucosal edge. Monsel's solution applied with hemostasis. Bimanual exam without masses or tenderness.  Assessment/Plan:  45 y.o. G2P1102 with slight oozing at the cuff, resolved with Monsel's. Otherwise doing well. We'll continue to slowly resume normal activities with the exception of continued pelvic rest. Follow up in 2 weeks for final postoperative visit.    Anastasio Auerbach MD, 8:23 AM 11/15/2015

## 2015-11-15 NOTE — Patient Instructions (Addendum)
Follow up in 2 weeks for your final postoperative visit. 

## 2015-11-20 ENCOUNTER — Ambulatory Visit
Admission: RE | Admit: 2015-11-20 | Discharge: 2015-11-20 | Disposition: A | Discharge status: Home / Self Care | Payer: BLUE CROSS/BLUE SHIELD | Source: Ambulatory Visit

## 2015-11-20 DIAGNOSIS — Z1231 Encounter for screening mammogram for malignant neoplasm of breast: Secondary | ICD-10-CM

## 2015-11-22 ENCOUNTER — Telehealth: Payer: Self-pay

## 2015-11-22 NOTE — Telephone Encounter (Signed)
Office visit sooner

## 2015-11-22 NOTE — Telephone Encounter (Signed)
Okay to extend

## 2015-11-22 NOTE — Telephone Encounter (Signed)
Patient called. Her planned return to work date is 11/28/15 on her disability papers.  She said her post op visit scheduled with you is not until 11/30/15 at 12:30pm so she would like to extend her time out of work until after visit date. That would be Monday, April 10.  She said she had been having some problems with bleeding/cramping and not feeling well off and on since surgery and wants to see you before returning.  Ok to extend return to work date?

## 2015-11-22 NOTE — Telephone Encounter (Signed)
Patient informed. Earlier appt scheduled.

## 2015-11-22 NOTE — Telephone Encounter (Signed)
Patient said she "is just so tired" and is still bleeding. She said she saturates a pad twice a day.  I told her I'd check with you to see if you recommend anything before her visit on 11/30/15.

## 2015-11-23 ENCOUNTER — Encounter: Payer: Self-pay | Admitting: Gynecology

## 2015-11-23 ENCOUNTER — Ambulatory Visit (INDEPENDENT_AMBULATORY_CARE_PROVIDER_SITE_OTHER): Payer: BLUE CROSS/BLUE SHIELD | Admitting: Gynecology

## 2015-11-23 VITALS — BP 124/80

## 2015-11-23 DIAGNOSIS — N939 Abnormal uterine and vaginal bleeding, unspecified: Secondary | ICD-10-CM

## 2015-11-23 LAB — CBC WITH DIFFERENTIAL/PLATELET
BASOS PCT: 1 % (ref 0–1)
Basophils Absolute: 0.1 10*3/uL (ref 0.0–0.1)
EOS PCT: 3 % (ref 0–5)
Eosinophils Absolute: 0.2 10*3/uL (ref 0.0–0.7)
HEMATOCRIT: 38.8 % (ref 36.0–46.0)
HEMOGLOBIN: 12.7 g/dL (ref 12.0–15.0)
LYMPHS ABS: 3 10*3/uL (ref 0.7–4.0)
LYMPHS PCT: 37 % (ref 12–46)
MCH: 31.4 pg (ref 26.0–34.0)
MCHC: 32.7 g/dL (ref 30.0–36.0)
MCV: 96 fL (ref 78.0–100.0)
MONOS PCT: 8 % (ref 3–12)
MPV: 10 fL (ref 8.6–12.4)
Monocytes Absolute: 0.7 10*3/uL (ref 0.1–1.0)
Neutro Abs: 4.2 10*3/uL (ref 1.7–7.7)
Neutrophils Relative %: 51 % (ref 43–77)
Platelets: 258 10*3/uL (ref 150–400)
RBC: 4.04 MIL/uL (ref 3.87–5.11)
RDW: 13.5 % (ref 11.5–15.5)
WBC: 8.2 10*3/uL (ref 4.0–10.5)

## 2015-11-23 NOTE — Progress Notes (Signed)
    [name] [birth year] [id number]        44 y.o.  G2P1102 Presents having undergone LAVH LSO right salpingectomy 10/16/2015. Was seen 11/15/2015 for her routine postoperative visit and had some oozing from the vaginal cuff which was treated with Monsel's. She notes that she has continued to ooze with slight staining/bleeding on and off. Not having significant pain. Otherwise doing well without fever or chills eating voiding and having bowel movements without difficulty. Does note some fatigue. Postoperative hemoglobin was 10  Past medical history,surgical history, problem list, medications, allergies, family history and social history were all reviewed and documented in the EPIC chart.  Directed ROS with pertinent positives and negatives documented in the history of present illness/assessment and plan.  Exam: Wandra Scot assistant Filed Vitals:   11/23/15 0759  BP: 124/80   General appearance:  Normal Abdomen soft nontender without masses guarding rebound Pelvic external BUS vagina with slight oozing right upper vaginal cuff at the same area as before. Silver nitrate applied with hemostasis achieved. Bimanual without masses or tenderness  Assessment/Plan:  45 y.o. [id number] with oozing of the vaginal cuff and hemostasis achieved with silver nitrate. Will keep her scheduled appointment in one week. Check baseline hemoglobin now given her fatigue and oozing. Follow up sooner if any issues.    Anastasio Auerbach MD, 8:12 AM 11/23/2015

## 2015-11-23 NOTE — Patient Instructions (Signed)
Follow up at your scheduled postoperative visit

## 2015-11-26 ENCOUNTER — Encounter: Payer: Self-pay | Admitting: Podiatry

## 2015-11-26 ENCOUNTER — Ambulatory Visit (INDEPENDENT_AMBULATORY_CARE_PROVIDER_SITE_OTHER): Payer: BLUE CROSS/BLUE SHIELD | Admitting: Podiatry

## 2015-11-26 ENCOUNTER — Other Ambulatory Visit: Payer: Self-pay

## 2015-11-26 VITALS — BP 126/83 | HR 66

## 2015-11-26 DIAGNOSIS — M2010 Hallux valgus (acquired), unspecified foot: Secondary | ICD-10-CM | POA: Diagnosis not present

## 2015-11-26 DIAGNOSIS — M21619 Bunion of unspecified foot: Secondary | ICD-10-CM | POA: Diagnosis not present

## 2015-11-26 DIAGNOSIS — M79606 Pain in leg, unspecified: Secondary | ICD-10-CM | POA: Diagnosis not present

## 2015-11-26 NOTE — Progress Notes (Signed)
SUBJECTIVE: 45 y.o. year old female presents for painful bunions. She has had for many years and getting worse recently with more pain. 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 She is recovering from Hysterectomy done a month ago.   REVIEW OF SYSTEMS: A comprehensive review of systems was negative.  OBJECTIVE: DERMATOLOGIC EXAMINATION: Painful nail border right great toe medial border with build up hard skin. No edema or inflammation noted. No abnormal skin lesions noted.   VASCULAR EXAMINATION OF LOWER LIMBS: Pedal pulses: All pedal pulses are palpable with normal pulsation.  Temperature gradient from tibial crest to dorsum of foot is within normal bilateral.  NEUROLOGIC EXAMINATION OF THE LOWER LIMBS: All epicritic and tactile sensations grossly intact.   MUSCULOSKELETAL EXAMINATION: HAV with bunion bilateral, symptomatic. Short hallux bilateral.  RADIOGRAPHIC STUDIES:  AP View:  Abducted hallux, enlarged medial eminence of the first metatarsal, short first metatarsal (-5) with fibular sesamoid position at 6  Bilateral. Increased lateral deviation of the CC angle bilateral.  Lateral view:  Cavus type foot with mild dorsal displacement of the first metatarsal bilateral. No acute changes seen in osseous or articular surfaces bilateral.  ASSESSMENT: 1. Hallux valgus with painful bunion bilateral. 2. Short first Metatarsal bone bilateral.   PLAN: Reviewed clinical findings and available treatment options. Patient request surgical options. McBride bunionectomy of right foot reviewed for the painful bunion on the short metatarsal bone.

## 2015-11-26 NOTE — Patient Instructions (Signed)
Seen for painful bunions. Pre op consent form reviewed for McBride bunionectomy right.

## 2015-11-29 ENCOUNTER — Other Ambulatory Visit: Payer: Self-pay | Admitting: Podiatry

## 2015-11-29 MED ORDER — HYDROCODONE-ACETAMINOPHEN 7.5-300 MG PO TABS: 1.0000 | ORAL_TABLET | ORAL | Status: DC | PRN

## 2015-11-30 ENCOUNTER — Ambulatory Visit: Payer: BLUE CROSS/BLUE SHIELD | Admitting: Gynecology

## 2015-11-30 DIAGNOSIS — M2020 Hallux rigidus, unspecified foot: Secondary | ICD-10-CM | POA: Diagnosis not present

## 2015-11-30 HISTORY — PX: BUNIONECTOMY: SHX129

## 2015-12-03 ENCOUNTER — Ambulatory Visit: Payer: BLUE CROSS/BLUE SHIELD | Admitting: Gynecology

## 2015-12-05 ENCOUNTER — Ambulatory Visit (INDEPENDENT_AMBULATORY_CARE_PROVIDER_SITE_OTHER): Payer: BLUE CROSS/BLUE SHIELD | Admitting: Gynecology

## 2015-12-05 ENCOUNTER — Encounter: Payer: Self-pay | Admitting: Podiatry

## 2015-12-05 ENCOUNTER — Ambulatory Visit (INDEPENDENT_AMBULATORY_CARE_PROVIDER_SITE_OTHER): Payer: BLUE CROSS/BLUE SHIELD | Admitting: Podiatry

## 2015-12-05 ENCOUNTER — Encounter: Payer: Self-pay | Admitting: Gynecology

## 2015-12-05 VITALS — BP 124/76

## 2015-12-05 VITALS — BP 120/79 | HR 89

## 2015-12-05 DIAGNOSIS — M21619 Bunion of unspecified foot: Secondary | ICD-10-CM

## 2015-12-05 DIAGNOSIS — M2010 Hallux valgus (acquired), unspecified foot: Secondary | ICD-10-CM

## 2015-12-05 DIAGNOSIS — Z09 Encounter for follow-up examination after completed treatment for conditions other than malignant neoplasm: Secondary | ICD-10-CM

## 2015-12-05 NOTE — Progress Notes (Signed)
   Patient is a 45 year old who presented to the office for her 6 weeks' final postop visit. Patient status post laparoscopic-assisted vaginal hysterectomy with left salpingo-oophorectomy and right salpingectomy as a result of her symptomatic leiomyomatous uteri, menorrhagia, dyspareunia and persistent left ovarian cyst with calcification. Patient is doing well. She has been seen twice since her surgery some vaginal bleeding and spotting that she had and it was the vaginal cuff area that had been contained with silver nitrate and Monsel solution. Findings from her surgery as well as pathology report as described below was discussed with the patient:  Findings: Large fundal intramural myoma. A large left fallopian tube measuring 3 x 5 cm smooth surface no excrescences noted. Evidence of bilateral tubal ligation noted. No evidence of peritoneal endometriosis. Normal-appearing appendix. Normal smooth liver surface and gallbladder seen. Scarring noted near the lower portion of the uterus near the cervix from previous cesarean section.  Diagnosis Uterus and bilateral fallopian tubes, with left ovary and cervix, right fallopian tube - CERVIX: SQUAMOUS METAPLASIA. - ENDOMETRIUM: PROLIFERATIVE. NO EVIDENCE OF HYPERPLASIA OR MALIGNANCY. - MYOMETRIUM: LEIOMYOMATA. NO EVIDENCE OF MALIGNANCY. - UTERINE SEROSA: UNREMARKABLE. NO ENDOMETRIOSIS OR MALIGNANCY. - RIGHT FALLOPIAN TUBE: UNREMARKABLE. NO ENDOMETRIOSIS OR MALIGNANCY. - LEFT OVARY: BENIGN FOLLICULAR CYST. NO ENDOMETRIOSIS OR MALIGNANCY. - LEFT FALLOPIAN TUBE: UNREMARKABLE. NO ENDOMETRIOSIS OR MALIGNANCY.  Exam: Abdomen: Soft nontender no rebound or guarding. Port sites completely healed. Pelvic: Bartholin urethra Skene was within normal limits Vagina: Vaginal cuff intact slight hyperemic area was contained with silver nitrate. Bimanual exam no palpable masses or tenderness Rectal exam: Not done  Assessment/plan: Patient 6 weeks status post  laparoscopic-assisted vaginal hysterectomy with left salpingo-oophorectomy and right salpingectomy doing well will return back to work next week in full normal activity. Patient otherwise scheduled to return in one year for annual exam or when necessary.

## 2015-12-05 NOTE — Progress Notes (Signed)
Subjective: 5 days post op following McBride bunionectomy right foot (11/30/15). Patient presented via wheel chair. Stated that her foot was very sore and dressing felt tight.  So she loosened her Ace wrap and rewrapped.  Objective: Dressing intact with minimum inner bleeding. Surgical wound clean and dry with intact Xeroform gauze. Mild forefoot edema noted.  Stiffness at the first MPJ from pain and guarding.   Assessment: Normal post op wound with mild forefoot edema possible due to tight Ace wrap.   Plan:  Dressing change done. Ok to bear weight. Elevated lower limb while resting.  Increase activity as tolerated.

## 2015-12-05 NOTE — Patient Instructions (Signed)
5 days post op wound healing normal with mild forefoot edema. Elevate lower limb while sitting. Ok to bear weight and increase activity as tolerated. Return in one week.

## 2015-12-06 ENCOUNTER — Encounter: Payer: BLUE CROSS/BLUE SHIELD | Admitting: Podiatry

## 2015-12-13 ENCOUNTER — Encounter: Payer: Self-pay | Admitting: Podiatry

## 2015-12-13 ENCOUNTER — Ambulatory Visit (INDEPENDENT_AMBULATORY_CARE_PROVIDER_SITE_OTHER): Payer: BLUE CROSS/BLUE SHIELD | Admitting: Podiatry

## 2015-12-13 DIAGNOSIS — Z9889 Other specified postprocedural states: Secondary | ICD-10-CM | POA: Diagnosis not present

## 2015-12-13 DIAGNOSIS — M21619 Bunion of unspecified foot: Secondary | ICD-10-CM

## 2015-12-13 NOTE — Patient Instructions (Signed)
Normal post op wound healing on right bunionectomy. Discussed left foot bunion surgery next week.

## 2015-12-13 NOTE — Progress Notes (Signed)
2 weeks post op following right foot McBride bunionectomy (11/30/15). Walking in surgical shoe without discomfort.  Well healed surgical site. Suture removed. Mild forefoot edema noted. Good first MPJ joint motion noted.   Post op X-ray reveal reduced deformity in right 1st Metatarsal head. No abnormal findings in x-ray noted.  Compression stockinet dispensed with tube foam gauze with instruction.  Reviewed having left foot bunion correction next week.

## 2015-12-20 ENCOUNTER — Other Ambulatory Visit: Payer: Self-pay | Admitting: Podiatry

## 2015-12-20 MED ORDER — HYDROCODONE-ACETAMINOPHEN 7.5-300 MG PO TABS: 1.0000 | ORAL_TABLET | ORAL | Status: DC | PRN

## 2015-12-21 DIAGNOSIS — M2010 Hallux valgus (acquired), unspecified foot: Secondary | ICD-10-CM | POA: Diagnosis not present

## 2015-12-21 HISTORY — PX: BUNIONECTOMY: SHX129

## 2015-12-26 ENCOUNTER — Encounter: Payer: Self-pay | Admitting: *Deleted

## 2015-12-27 ENCOUNTER — Ambulatory Visit (INDEPENDENT_AMBULATORY_CARE_PROVIDER_SITE_OTHER): Payer: BLUE CROSS/BLUE SHIELD | Admitting: Podiatry

## 2015-12-27 ENCOUNTER — Encounter: Payer: Self-pay | Admitting: Podiatry

## 2015-12-27 ENCOUNTER — Encounter: Payer: BLUE CROSS/BLUE SHIELD | Admitting: Podiatry

## 2015-12-27 DIAGNOSIS — M2010 Hallux valgus (acquired), unspecified foot: Secondary | ICD-10-CM | POA: Diagnosis not present

## 2015-12-27 DIAGNOSIS — Z9889 Other specified postprocedural states: Secondary | ICD-10-CM

## 2015-12-27 NOTE — Patient Instructions (Signed)
1 week post op visit doing well. Dressing changed. Keep it dry and return in one week.

## 2015-12-27 NOTE — Progress Notes (Signed)
1 week following Left McBride and 4 weeks following right foot Mcbride.  Both wound healing well. Minimum edema on left foot. Dressing changed. Return in one week.

## 2016-01-03 ENCOUNTER — Encounter: Payer: Self-pay | Admitting: Podiatry

## 2016-01-03 ENCOUNTER — Ambulatory Visit (INDEPENDENT_AMBULATORY_CARE_PROVIDER_SITE_OTHER): Payer: BLUE CROSS/BLUE SHIELD | Admitting: Podiatry

## 2016-01-03 VITALS — BP 134/90 | HR 69

## 2016-01-03 DIAGNOSIS — Z9889 Other specified postprocedural states: Secondary | ICD-10-CM

## 2016-01-03 NOTE — Patient Instructions (Signed)
2 week follow up left foot surgery. Healed well. Sutures removed. Keep compression stockinet during the day. May wear regular shoes. Return in 2 weeks.

## 2016-01-03 NOTE — Progress Notes (Signed)
2 weeks following Left McBride (12/21/15) and 5 weeks (11/30/15) following right foot Mcbride.  Both wound healing well. Minimum edema on left foot. Suture removed on left foot and covered with Mefix tape and followed by compression stockinet. Return in 2 weeks.

## 2016-01-11 ENCOUNTER — Ambulatory Visit (INDEPENDENT_AMBULATORY_CARE_PROVIDER_SITE_OTHER): Payer: BLUE CROSS/BLUE SHIELD | Admitting: Gynecology

## 2016-01-11 ENCOUNTER — Encounter: Payer: Self-pay | Admitting: Gynecology

## 2016-01-11 VITALS — BP 124/78

## 2016-01-11 DIAGNOSIS — Z09 Encounter for follow-up examination after completed treatment for conditions other than malignant neoplasm: Secondary | ICD-10-CM

## 2016-01-14 ENCOUNTER — Encounter: Payer: Self-pay | Admitting: Gynecology

## 2016-01-14 NOTE — Progress Notes (Signed)
   Patient is a 45 year old that presented to the office complaining nonspecific low abdominal discomfort at times and some spotting after intercourse. She was last seen the office on 12/05/2015 and her history as follows:  Patient status post laparoscopic-assisted vaginal hysterectomy with left salpingo-oophorectomy and right salpingectomy as a result of her symptomatic leiomyomatous uteri, menorrhagia, dyspareunia and persistent left ovarian cyst with calcification. Patient is doing well. She has been seen twice since her surgery some vaginal bleeding and spotting that she had and it was the vaginal cuff area that had been contained with silver nitrate and Monsel solution. Findings from her surgery as well as pathology report as described below was discussed with the patient:  Findings: Large fundal intramural myoma. A large left fallopian tube measuring 3 x 5 cm smooth surface no excrescences noted. Evidence of bilateral tubal ligation noted. No evidence of peritoneal endometriosis. Normal-appearing appendix. Normal smooth liver surface and gallbladder seen. Scarring noted near the lower portion of the uterus near the cervix from previous cesarean section.  Diagnosis Uterus and bilateral fallopian tubes, with left ovary and cervix, right fallopian tube - CERVIX: SQUAMOUS METAPLASIA. - ENDOMETRIUM: PROLIFERATIVE. NO EVIDENCE OF HYPERPLASIA OR MALIGNANCY. - MYOMETRIUM: LEIOMYOMATA. NO EVIDENCE OF MALIGNANCY. - UTERINE SEROSA: UNREMARKABLE. NO ENDOMETRIOSIS OR MALIGNANCY. - RIGHT FALLOPIAN TUBE: UNREMARKABLE. NO ENDOMETRIOSIS OR MALIGNANCY. - LEFT OVARY: BENIGN FOLLICULAR CYST. NO ENDOMETRIOSIS OR MALIGNANCY. - LEFT FALLOPIAN TUBE: UNREMARKABLE. NO ENDOMETRIOSIS OR MALIGNANCY.  Exam: Abdomen: Soft nontender no rebound or guarding. Port sites completely healed. Pelvic: Bartholin urethra Skene was within normal limits Vagina: Vaginal cuff slightly friable at the right area contained with Monsel  solution so nitrate. Bimanual exam no palpable masses or tenderness Rectal exam: Not done  Assessment/plan: Patient status post laparoscopic-assisted vaginal hysterectomy with left salpingo-oophorectomy and right salpingectomy have been doing well. Her discomfort may be attributed to pelvic adhesions. Patient with no GU or GI complaints the small friable area in the vaginal cuff was contained with Monsel solution silver nitrate. She was asked to refrain from intercourse for one week. Otherwise return to the office next year for annual exam or when necessary.

## 2016-01-17 ENCOUNTER — Encounter: Payer: Self-pay | Admitting: Podiatry

## 2016-01-17 ENCOUNTER — Ambulatory Visit (INDEPENDENT_AMBULATORY_CARE_PROVIDER_SITE_OTHER): Payer: BLUE CROSS/BLUE SHIELD | Admitting: Podiatry

## 2016-01-17 VITALS — BP 145/86 | HR 70

## 2016-01-17 DIAGNOSIS — Z9889 Other specified postprocedural states: Secondary | ICD-10-CM

## 2016-01-17 NOTE — Patient Instructions (Signed)
Post op wound healing good with some dorsal edema left. Compression bandage dispensed. Return as needed.

## 2016-01-17 NOTE — Progress Notes (Signed)
Status post McBride bunionectomy (3 and a half week on left, 6 weeks on right,  12/21/15 left, 11/30/15 right). Wearing flat shoes without difficulty. Healing well with some dorsal edema R>L. Incision site burns on left foot. Reduced deformity noted on surgery site. Tube foam pad and compression stockinet dispensed. Return as needed.

## 2016-02-07 ENCOUNTER — Ambulatory Visit (INDEPENDENT_AMBULATORY_CARE_PROVIDER_SITE_OTHER): Payer: BLUE CROSS/BLUE SHIELD | Admitting: Podiatry

## 2016-02-07 ENCOUNTER — Encounter: Payer: Self-pay | Admitting: Podiatry

## 2016-02-07 DIAGNOSIS — M792 Neuralgia and neuritis, unspecified: Secondary | ICD-10-CM | POA: Diagnosis not present

## 2016-02-07 DIAGNOSIS — M2010 Hallux valgus (acquired), unspecified foot: Secondary | ICD-10-CM

## 2016-02-07 DIAGNOSIS — M79606 Pain in leg, unspecified: Secondary | ICD-10-CM

## 2016-02-07 NOTE — Progress Notes (Signed)
Status post McBride bunionectomy (12/21/15 left, 11/30/15 right).  Left foot continues to hurt at incision site.  Anything touches, hurt really bad.  Wearing flat shoes without difficulty. Incision site burns on left foot. Reduced deformity noted on surgery site.  Assessment: Post op pain on left follow McBride bunionectomy. Neuralgia. Excess scar formation at incision site.  Plan:  Reviewed clinical findings and available options. Will try two Cortisone injections. If condition continue, may require neurectomy with scar revision.  Left foot surgical site injected with mixture of 4 mg Dexamethasone, 4 mg Triamcinolone, and 1 cc of 0.5% Marcaine plain. Patient tolerated well without difficulty.   Return in 3 weeks to repeat injection if indicated.

## 2016-02-07 NOTE — Patient Instructions (Signed)
Post op painful scar left foot. Cortisone injection given. Return in 3 weeks.

## 2016-02-21 ENCOUNTER — Encounter: Payer: Self-pay | Admitting: Podiatry

## 2016-02-21 ENCOUNTER — Ambulatory Visit (INDEPENDENT_AMBULATORY_CARE_PROVIDER_SITE_OTHER): Payer: BLUE CROSS/BLUE SHIELD | Admitting: Podiatry

## 2016-02-21 VITALS — BP 141/87 | HR 72

## 2016-02-21 DIAGNOSIS — M792 Neuralgia and neuritis, unspecified: Secondary | ICD-10-CM

## 2016-02-21 NOTE — Progress Notes (Signed)
Subjective: Status post McBride bunionectomy (12/21/15 left, 11/30/15 right).  Left foot continues to have discomfort at incision site after injection.  Injection helped some but not completely.  Patient request for surgical intervention. Able to wear Tennis shoes. Incision site burns on left foot. Reduced deformity noted on surgery site right. Noted of residual deformity with tenderness on left.   Assessment: Post op pain on left follow McBride bunionectomy. Neuralgia left Dorsomedial cutaneous nerve. Excess scar formation at incision site.  Plan:  Reviewed clinical findings and available options. Surgery consent form reviewed for Neurolysis/Neurectomy with removal of residual bunion left foot.

## 2016-02-22 NOTE — Patient Instructions (Signed)
Surgery consent form reviewed for neurectomy and removal of residual bunion left foot.

## 2016-03-03 ENCOUNTER — Encounter: Payer: BLUE CROSS/BLUE SHIELD | Admitting: Podiatry

## 2016-03-06 ENCOUNTER — Other Ambulatory Visit: Payer: Self-pay | Admitting: Podiatry

## 2016-03-06 MED ORDER — NABUMETONE 500 MG PO TABS: 500.0000 mg | ORAL_TABLET | Freq: Every day | ORAL | Status: DC

## 2016-03-06 MED ORDER — HYDROCODONE-ACETAMINOPHEN 7.5-300 MG PO TABS: 1.0000 | ORAL_TABLET | ORAL | Status: DC | PRN

## 2016-03-07 ENCOUNTER — Encounter: Payer: Self-pay | Admitting: *Deleted

## 2016-03-07 DIAGNOSIS — M2010 Hallux valgus (acquired), unspecified foot: Secondary | ICD-10-CM | POA: Diagnosis not present

## 2016-03-07 HISTORY — PX: BUNIONECTOMY: SHX129

## 2016-03-11 ENCOUNTER — Ambulatory Visit (INDEPENDENT_AMBULATORY_CARE_PROVIDER_SITE_OTHER): Payer: BLUE CROSS/BLUE SHIELD | Admitting: Podiatry

## 2016-03-11 ENCOUNTER — Encounter: Payer: Self-pay | Admitting: Podiatry

## 2016-03-11 VITALS — BP 129/82 | HR 75

## 2016-03-11 DIAGNOSIS — Z9889 Other specified postprocedural states: Secondary | ICD-10-CM

## 2016-03-11 NOTE — Patient Instructions (Signed)
Normal post op wound doing well. Return in one week.

## 2016-03-11 NOTE — Progress Notes (Signed)
5 days post op McBride left foot. Denies any discomfort. Wound is dry without pain or edema. Dressing is clean and dry with small spot of dry blood. Wound dressing changed.

## 2016-03-13 ENCOUNTER — Encounter: Payer: BLUE CROSS/BLUE SHIELD | Admitting: Podiatry

## 2016-03-18 ENCOUNTER — Encounter: Payer: BLUE CROSS/BLUE SHIELD | Admitting: Podiatry

## 2016-03-18 ENCOUNTER — Encounter: Payer: Self-pay | Admitting: Podiatry

## 2016-03-18 ENCOUNTER — Ambulatory Visit (INDEPENDENT_AMBULATORY_CARE_PROVIDER_SITE_OTHER): Payer: BLUE CROSS/BLUE SHIELD | Admitting: Podiatry

## 2016-03-18 DIAGNOSIS — M2010 Hallux valgus (acquired), unspecified foot: Secondary | ICD-10-CM

## 2016-03-18 NOTE — Patient Instructions (Signed)
Sutures removed. Post op wound care done. Return in 2 weeks.

## 2016-03-18 NOTE — Progress Notes (Signed)
2 weeks post op right foot McBride bunionectomy. Bandage has been loose and all turned around. Skin incision has small area of gapping from gauze friction. Sutures removed. Area cleansed with H2O2, applied Steri strips. Compression dressing applied with Mefix tape and elastic stockinet. Home care instruction and Mefix tape, stockinet given. Return in 2 weeks.

## 2016-04-01 ENCOUNTER — Ambulatory Visit (INDEPENDENT_AMBULATORY_CARE_PROVIDER_SITE_OTHER): Payer: BLUE CROSS/BLUE SHIELD | Admitting: Podiatry

## 2016-04-01 ENCOUNTER — Encounter: Payer: Self-pay | Admitting: Podiatry

## 2016-04-01 ENCOUNTER — Encounter (INDEPENDENT_AMBULATORY_CARE_PROVIDER_SITE_OTHER): Payer: Self-pay

## 2016-04-01 DIAGNOSIS — Z9889 Other specified postprocedural states: Secondary | ICD-10-CM

## 2016-04-01 NOTE — Patient Instructions (Signed)
Satisfactory post op wound healing. Return as needed.

## 2016-04-01 NOTE — Progress Notes (Signed)
4 weeks post op right foot McBride bunionectomy. Patient came in wearing high boots. Denies any discomfort. Left foot feels and looks good for her. Continues to use Coban at night. Dispensed Coban wrap x 1. Resume regular activity and return as needed.

## 2016-04-25 ENCOUNTER — Telehealth: Payer: Self-pay

## 2016-04-25 NOTE — Telephone Encounter (Signed)
Patient called at 4:20pm because she had hysterectomy in Feb 2017 and for the last month she has had vaginal spotting. She reports it is a little heavier after sex but then tapers back off.  She scheduled an appointment for Tuesday at 9:30am with Dr. Moshe Salisbury.  Patient wanted to know if this was "normal": I explained that it is not normal but likely not anything serious. Probably related to vaginal cuff like it was at May post op visit when she was spotting.  I encouraged her to keep the appt Tuesday and let Dr. Moshe Salisbury check her. I told her if anything changed and she felt like she needed to be checked prior to Tuesday morning that she can go to [place] and provider there will examine her.

## 2016-04-29 ENCOUNTER — Encounter: Payer: Self-pay | Admitting: Gynecology

## 2016-04-29 ENCOUNTER — Ambulatory Visit (INDEPENDENT_AMBULATORY_CARE_PROVIDER_SITE_OTHER): Payer: BLUE CROSS/BLUE SHIELD

## 2016-04-29 ENCOUNTER — Ambulatory Visit (INDEPENDENT_AMBULATORY_CARE_PROVIDER_SITE_OTHER): Payer: BLUE CROSS/BLUE SHIELD | Admitting: Gynecology

## 2016-04-29 VITALS — BP 124/78

## 2016-04-29 DIAGNOSIS — N898 Other specified noninflammatory disorders of vagina: Secondary | ICD-10-CM | POA: Insufficient documentation

## 2016-04-29 DIAGNOSIS — Z8639 Personal history of other endocrine, nutritional and metabolic disease: Secondary | ICD-10-CM

## 2016-04-29 DIAGNOSIS — R634 Abnormal weight loss: Secondary | ICD-10-CM

## 2016-04-29 DIAGNOSIS — Z23 Encounter for immunization: Secondary | ICD-10-CM

## 2016-04-29 DIAGNOSIS — N899 Noninflammatory disorder of vagina, unspecified: Secondary | ICD-10-CM

## 2016-04-29 DIAGNOSIS — R102 Pelvic and perineal pain: Secondary | ICD-10-CM | POA: Diagnosis not present

## 2016-04-29 DIAGNOSIS — M6289 Other specified disorders of muscle: Secondary | ICD-10-CM | POA: Diagnosis not present

## 2016-04-29 LAB — COMPREHENSIVE METABOLIC PANEL
ALBUMIN: 4.6 g/dL (ref 3.6–5.1)
ALT: 15 U/L (ref 6–29)
AST: 18 U/L (ref 10–30)
Alkaline Phosphatase: 55 U/L (ref 33–115)
BILIRUBIN TOTAL: 0.5 mg/dL (ref 0.2–1.2)
BUN: 22 mg/dL (ref 7–25)
CO2: 20 mmol/L (ref 20–31)
CREATININE: 0.97 mg/dL (ref 0.50–1.10)
Calcium: 9.3 mg/dL (ref 8.6–10.2)
Chloride: 105 mmol/L (ref 98–110)
Glucose, Bld: 85 mg/dL (ref 65–99)
Potassium: 3.9 mmol/L (ref 3.5–5.3)
SODIUM: 140 mmol/L (ref 135–146)
TOTAL PROTEIN: 7.5 g/dL (ref 6.1–8.1)

## 2016-04-29 LAB — TSH: TSH: 0.63 mIU/L

## 2016-04-29 LAB — WET PREP FOR TRICH, YEAST, CLUE
CLUE CELLS WET PREP: NONE SEEN
TRICH WET PREP: NONE SEEN

## 2016-04-29 MED ORDER — FLUCONAZOLE 150 MG PO TABS
150.0000 mg | ORAL_TABLET | Freq: Once | ORAL | 0 refills | Status: AC
Start: 2016-04-29 — End: 2016-04-29

## 2016-04-29 MED ORDER — METRONIDAZOLE 0.75 % VA GEL: 1.0000 | Freq: Two times a day (BID) | VAGINAL | 0 refills | Status: DC

## 2016-04-29 NOTE — Progress Notes (Signed)
HPI: Patient is a 45 year old presents to the office with a complaint of on and off for several months right lower quadrant discomfort. She also complains of decreased appetite and loss of weight. Review of patient's record indicates she has had past history of what she calls thyroid disease but has never been on any medication. Also patient having a vacation discharge after intercourse. She was seen in the office on 01/07/2016 for her final postop visit. The note stated the following:  Patient status post laparoscopic-assisted vaginal hysterectomy with left salpingo-oophorectomy and right salpingectomy as a result of her symptomatic leiomyomatous uteri, menorrhagia, dyspareunia and persistent left ovarian cyst with calcification. Patient is doing well. She has been seen twice since her surgery some vaginal bleeding and spotting that she had and it was the vaginal cuff area that had been contained with silver nitrate and Monsel solution. Findings from her surgery as well as pathology report as described below was discussed with the patient:  Findings: Large fundal intramural myoma. A large left fallopian tube measuring 3 x 5 cm smooth surface no excrescences noted. Evidence of bilateral tubal ligation noted. No evidence of peritoneal endometriosis. Normal-appearing appendix. Normal smooth liver surface and gallbladder seen. Scarring noted near the lower portion of the uterus near the cervix from previous cesarean section.  Diagnosis Uterus and bilateral fallopian tubes, with left ovary and cervix, right fallopian tube - CERVIX: SQUAMOUS METAPLASIA. - ENDOMETRIUM: PROLIFERATIVE. NO EVIDENCE OF HYPERPLASIA OR MALIGNANCY. - MYOMETRIUM: LEIOMYOMATA. NO EVIDENCE OF MALIGNANCY. - UTERINE SEROSA: UNREMARKABLE. NO ENDOMETRIOSIS OR MALIGNANCY. - RIGHT FALLOPIAN TUBE: UNREMARKABLE. NO ENDOMETRIOSIS OR MALIGNANCY. - LEFT OVARY: BENIGN FOLLICULAR CYST. NO ENDOMETRIOSIS OR MALIGNANCY. - LEFT FALLOPIAN TUBE:  UNREMARKABLE. NO ENDOMETRIOSIS OR MALIGNANCY.   Patient was found after and the exam a slide from area in the right area of the vaginal cuff which was contained with Monsel solution and silver nitrate. Patient denies any fever, chills, nausea or vomiting, just a slight discharge.   ROS: A ROS was performed and pertinent positives and negatives are included in the history.  GENERAL: No fevers or chills. HEENT: No change in vision, no earache, sore throat or sinus congestion. NECK: No pain or stiffness. CARDIOVASCULAR: No chest pain or pressure. No palpitations. PULMONARY: No shortness of breath, cough or wheeze. GASTROINTESTINAL: No abdominal pain, nausea, vomiting or diarrhea, melena or bright red blood per rectum. GENITOURINARY: Pinkish discharge especially after intercourse. MUSCULOSKELETAL: No joint or muscle pain, no back pain, no recent trauma. DERMATOLOGIC: No rash, no itching, no lesions. ENDOCRINE: No polyuria, polydipsia, no heat or cold intolerance. No recent change in weight. HEMATOLOGICAL: No anemia or easy bruising or bleeding. NEUROLOGIC: No headache, seizures, numbness, tingling or weakness. PSYCHIATRIC: No depression, no loss of interest in normal activity or change in sleep pattern.   PE: Blood pressure 09/17/1976 Gen. appearance well-developed C November the above-mentioned complaining Exam: Back: No CVA tenderness abdomen: Soft nontender rebound or guarding Pelvic: Bartholin urethra Skene was within normal limits Vagina: Vaginal cuff intact with exception small area in the right corner of the vaginal cuff which was friable which was contained with silver nitrate and Monsel solution.  An ultrasound today instead of a bimanual exam demonstrated the following: Absent uterus absent left adnexa. Right ovary difficult to manage due to his discomfort patient was experiencing no apparent masses some fluid was noted the cul-de-sac possible from a ruptured follicle. Left adnexa excess of  bowel activity with bowel shadowing   Wet prep: Rare yeast, few WBC,  many bacteria   Assessment Plan: Because of patient's weight loss change in appetite and past history of thyroid disease we are going to check a TSH today along with a comprehensive metabolic panel and a vitamin D level. We'll also check a CBC as well to rule out anemia. Patient with granulation tissue present the vaginal cuff I will ask her to return back in one week to recauterize serially once again to abstain from intercourse and will call in MetroGel vaginal cream to apply daily at bedtime for 7-10 days.For constipation recommend she take MiraLAX 1 tablespoon with meals every day.    Greater than 50% of time was spent in counseling and coordinating care of this patient.   Time of consultation: 20   Minutes.

## 2016-04-30 LAB — VITAMIN D 25 HYDROXY (VIT D DEFICIENCY, FRACTURES): Vit D, 25-Hydroxy: 39 ng/mL (ref 30–100)

## 2016-05-09 ENCOUNTER — Ambulatory Visit: Payer: BLUE CROSS/BLUE SHIELD | Admitting: Gynecology

## 2016-08-13 DIAGNOSIS — L2089 Other atopic dermatitis: Secondary | ICD-10-CM | POA: Diagnosis not present

## 2016-08-13 DIAGNOSIS — L700 Acne vulgaris: Secondary | ICD-10-CM | POA: Diagnosis not present

## 2016-09-17 ENCOUNTER — Encounter: Payer: Self-pay | Admitting: Gynecology

## 2016-09-17 ENCOUNTER — Ambulatory Visit (INDEPENDENT_AMBULATORY_CARE_PROVIDER_SITE_OTHER): Payer: BLUE CROSS/BLUE SHIELD | Admitting: Gynecology

## 2016-09-17 VITALS — BP 118/80 | Ht 67.0 in | Wt 178.0 lb

## 2016-09-17 DIAGNOSIS — N764 Abscess of vulva: Secondary | ICD-10-CM

## 2016-09-17 MED ORDER — OXYCODONE-ACETAMINOPHEN 5-325 MG PO TABS: 1.0000 | ORAL_TABLET | ORAL | 0 refills | Status: DC | PRN

## 2016-09-17 MED ORDER — DOXYCYCLINE HYCLATE 50 MG PO CAPS: 100.0000 mg | ORAL_CAPSULE | Freq: Two times a day (BID) | ORAL | 0 refills | Status: DC

## 2016-09-17 NOTE — Progress Notes (Signed)
   Patient is a 46 year old presented to the office today complaining of a nodularity which is tender near her external genitalia. She is in a monogamous relationship. Last year patient had a laparoscopic-assisted vaginal hysterectomy with left salpingo-oophorectomy and right salpingectomy as a result of symptomatic leiomyomatous uteri, menorrhagia, dyspareunia and persistent left ovarian cyst with calcification. Her pathology report was otherwise benign. Patient denies any unusual discharge and no fever, chills, nausea, or vomiting.  Exam: External genitalia Bartholin urethra Skene was within normal limits The inferior portion of the right labia majora was a erythematous tender nodule areas suspicious for follicular abscess  The area was cleansed with Betadine solution 1% lidocaine was infiltrated subdermally a small incision was made and the Cyst was drained. With the use of a curved hemostat the loculation was broken down in the area was debrided with hydrogen peroxide and a neugauze wick for her to remove in 24 hours. Aerobic and anaerobic culture was obtained. She will be prescribed by Vibramycin 100 mg take 1 by mouth twice a day for 2 weeks.  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 Patient to debride the area at home twice a day and apply Neosporin and take her Vibramycin 100 mg one by mouth twice a day for 2 weeks.

## 2016-09-17 NOTE — Addendum Note (Signed)
Addended by: Burnett Kanaris on: 09/17/2016 02:55 PM   Modules accepted: Orders

## 2016-09-18 ENCOUNTER — Telehealth: Payer: Self-pay | Admitting: *Deleted

## 2016-09-18 NOTE — Telephone Encounter (Signed)
Pt was seen yesterday Vulvar abscess had Nagauze wick packed in incision, she pulled the string to removed, but it broke, unable to see the gauze. I advised pt come to have removed, are you okay with patient coming in this pm or okay to wait until tomorrow? Please advise

## 2016-09-18 NOTE — Telephone Encounter (Signed)
Pt informed with the below note, unable to come today, in [place], will come in am

## 2016-09-18 NOTE — Telephone Encounter (Signed)
She can come today

## 2016-09-19 ENCOUNTER — Encounter: Payer: Self-pay | Admitting: Gynecology

## 2016-09-19 ENCOUNTER — Ambulatory Visit (INDEPENDENT_AMBULATORY_CARE_PROVIDER_SITE_OTHER): Payer: BLUE CROSS/BLUE SHIELD | Admitting: Gynecology

## 2016-09-19 VITALS — BP 120/78 | Ht 67.0 in | Wt 178.0 lb

## 2016-09-19 DIAGNOSIS — N764 Abscess of vulva: Secondary | ICD-10-CM

## 2016-09-19 NOTE — Progress Notes (Signed)
   Patient is a 46 year old was seen the office 2 days ago whereby she had an incision and drainage of a right inferior left labia majora follicular abscess. The area had been debrided with hydrogen peroxide and a small Nugauze wick was placed and she was to remove with the following day which would have been yesterday. She thought it was longer than what came out and was concerned and thus the reason for today's visit. Her culture for aerobic and anaerobic microorganisms pending at time of this dictation. She has started her Vibramycin 100 mg twice a day for 2 weeks for MRSA prevention and she was to continue to do sitz baths every night for one week and apply Neosporin twice a day to the area. She is to refrain from intercourse for one week.  The area was inspected today soft or not as tender and minimal induration no evidence of erythema and no drainage.  Assessment for/plan patient status post incision and drainage of right labia majora follicular abscess doing well continue management as described above.

## 2016-09-21 LAB — WOUND CULTURE
GRAM STAIN: NONE SEEN
Gram Stain: NONE SEEN
Gram Stain: NONE SEEN

## 2016-10-27 DIAGNOSIS — J309 Allergic rhinitis, unspecified: Secondary | ICD-10-CM | POA: Diagnosis not present

## 2016-10-27 DIAGNOSIS — J019 Acute sinusitis, unspecified: Secondary | ICD-10-CM | POA: Diagnosis not present

## 2016-10-27 DIAGNOSIS — J029 Acute pharyngitis, unspecified: Secondary | ICD-10-CM | POA: Diagnosis not present

## 2016-11-07 DIAGNOSIS — J069 Acute upper respiratory infection, unspecified: Secondary | ICD-10-CM | POA: Diagnosis not present

## 2016-11-11 ENCOUNTER — Other Ambulatory Visit: Payer: Self-pay | Admitting: Gynecology

## 2016-11-11 DIAGNOSIS — Z1231 Encounter for screening mammogram for malignant neoplasm of breast: Secondary | ICD-10-CM

## 2016-11-24 ENCOUNTER — Ambulatory Visit (INDEPENDENT_AMBULATORY_CARE_PROVIDER_SITE_OTHER): Payer: BLUE CROSS/BLUE SHIELD | Admitting: Gynecology

## 2016-11-24 ENCOUNTER — Telehealth: Payer: Self-pay | Admitting: *Deleted

## 2016-11-24 ENCOUNTER — Encounter: Payer: Self-pay | Admitting: Gynecology

## 2016-11-24 VITALS — BP 122/76 | Ht 67.0 in | Wt 176.8 lb

## 2016-11-24 DIAGNOSIS — Z8349 Family history of other endocrine, nutritional and metabolic diseases: Secondary | ICD-10-CM

## 2016-11-24 DIAGNOSIS — Z01419 Encounter for gynecological examination (general) (routine) without abnormal findings: Secondary | ICD-10-CM

## 2016-11-24 NOTE — Telephone Encounter (Signed)
Patient scheduled on 11/27/16 at [facility] for [facility]ogram.

## 2016-11-24 NOTE — Progress Notes (Signed)
[name] 1971/04/10 [id number]   History:    46 y.o.  for annual gyn exam with no complaints today. Review of patient's record indicated back in 2016 at time of an annual exam it was described that patient had been followed by the endocrinologist Dr. Bubba Camp. Her records from 2010 had indicated she had thyrotoxicosis and had iodine 131 thyroid treatment but she states that there was her mother not her a she's currently on no medication and last year's TSH was normal.  2017 patient had laparoscopic-assisted vaginal hysterectomy with left salpingo-oophorectomy and right salpingectomy as a result of her symptomatic leiomyomatous uteri, menorrhagia, dyspareunia and persistent left ovarian cyst with calcification. She has done well from her surgery.  2018 in January of this year patient had an incision and drainage of a right labia majora follicular abscess and has done well.  Patient with no past history of any high-grade dysplasia.  Past medical history,surgical history, family history and social history were all reviewed and documented in the EPIC chart.  Gynecologic History Patient's last menstrual period was 09/11/2015. Contraception: status post hysterectomy Last Pap: 2015. Results were: normal Last mammogram: 2017. Results were: normal  Obstetric History OB History  Gravida Para Term Preterm AB Living  2 2 1 1   2   SAB TAB Ectopic Multiple Live Births          2    # Outcome Date GA Lbr Len/2nd Weight Sex Delivery Anes PTL Lv  2 Preterm     F CS-Unspec  Y LIV  1 Term     M CS-Unspec  N LIV       ROS: A ROS was performed and pertinent positives and negatives are included in the history.  GENERAL: No fevers or chills. HEENT: No change in vision, no earache, sore throat or sinus congestion. NECK: No pain or stiffness. CARDIOVASCULAR: No chest pain or pressure. No palpitations. PULMONARY: No shortness of breath, cough or wheeze. GASTROINTESTINAL: No abdominal pain, nausea,  vomiting or diarrhea, melena or bright red blood per rectum. GENITOURINARY: No urinary frequency, urgency, hesitancy or dysuria. MUSCULOSKELETAL: No joint or muscle pain, no back pain, no recent trauma. DERMATOLOGIC: No rash, no itching, no lesions. ENDOCRINE: No polyuria, polydipsia, no heat or cold intolerance. No recent change in weight. HEMATOLOGICAL: No anemia or easy bruising or bleeding. NEUROLOGIC: No headache, seizures, numbness, tingling or weakness. PSYCHIATRIC: No depression, no loss of interest in normal activity or change in sleep pattern.     Exam: chaperone present  BP 122/76   Ht 5\' 7"  (1.702 m)   Wt 176 lb 12.8 oz (80.2 kg)   LMP 09/11/2015   BMI 27.69 kg/m   Body mass index is 27.69 kg/m.  General appearance : Well developed well nourished female. No acute distress HEENT: Eyes: no retinal hemorrhage or exudates,  Neck supple, trachea midline, no carotid bruits, no thyroidmegaly Lungs: Clear to auscultation, no rhonchi or wheezes, or rib retractions  Heart: Regular rate and rhythm, no murmurs or gallops Breast:Examined in sitting and supine position were symmetrical in appearance, no palpable masses or tenderness,  no skin retraction, no nipple inversion, no nipple discharge, no skin discoloration, no axillary or supraclavicular lymphadenopathy Abdomen: no palpable masses or tenderness, no rebound or guarding Extremities: no edema or skin discoloration or tenderness  Pelvic:  Bartholin, Urethra, Skene Glands: Within normal limits             Vagina: No gross lesions or discharge  Cervix:  Absent  Uterus  absent  Adnexa  Without masses or tenderness  Anus and perineum  normal   Rectovaginal  normal sphincter tone without palpated masses or tenderness             Hemoccult not indicated     Assessment/Plan:  46 y.o. female for annual exam with no abnormalities noted. Pap smear no longer indicated according to new guidelines. Due to her mother's history of thyroid  disease a TSH will be checked today along with comprehensive metabolic panel, CBC, fasting lipid profile. Patient was reminded to schedule her mammogram this year.   Terrance Mass MD, 11:29 AM 11/24/2016

## 2016-11-24 NOTE — Telephone Encounter (Signed)
-----   Message from Terrance Mass, MD sent at 11/24/2016 11:34 AM EDT ----- Please call patient and remind her that she is due for her mammogram this year and she needs to schedule an appointment

## 2016-11-26 ENCOUNTER — Other Ambulatory Visit: Payer: BLUE CROSS/BLUE SHIELD

## 2016-11-27 ENCOUNTER — Ambulatory Visit
Admission: RE | Admit: 2016-11-27 | Discharge: 2016-11-27 | Disposition: A | Discharge status: Home / Self Care | Payer: BLUE CROSS/BLUE SHIELD | Source: Ambulatory Visit | Attending: Gynecology | Admitting: Gynecology

## 2016-11-27 ENCOUNTER — Other Ambulatory Visit: Payer: BLUE CROSS/BLUE SHIELD

## 2016-11-27 DIAGNOSIS — Z8349 Family history of other endocrine, nutritional and metabolic diseases: Secondary | ICD-10-CM | POA: Diagnosis not present

## 2016-11-27 DIAGNOSIS — Z1231 Encounter for screening mammogram for malignant neoplasm of breast: Secondary | ICD-10-CM | POA: Diagnosis not present

## 2016-11-27 DIAGNOSIS — Z01419 Encounter for gynecological examination (general) (routine) without abnormal findings: Secondary | ICD-10-CM | POA: Diagnosis not present

## 2016-11-27 LAB — COMPREHENSIVE METABOLIC PANEL
ALT: 19 U/L (ref 6–29)
AST: 21 U/L (ref 10–35)
Albumin: 4 g/dL (ref 3.6–5.1)
Alkaline Phosphatase: 52 U/L (ref 33–115)
BILIRUBIN TOTAL: 0.3 mg/dL (ref 0.2–1.2)
BUN: 22 mg/dL (ref 7–25)
CALCIUM: 8.9 mg/dL (ref 8.6–10.2)
CO2: 28 mmol/L (ref 20–31)
Chloride: 108 mmol/L (ref 98–110)
Creat: 0.89 mg/dL (ref 0.50–1.10)
GLUCOSE: 90 mg/dL (ref 65–99)
Potassium: 4.4 mmol/L (ref 3.5–5.3)
Sodium: 140 mmol/L (ref 135–146)
Total Protein: 6.6 g/dL (ref 6.1–8.1)

## 2016-11-27 LAB — CBC WITH DIFFERENTIAL/PLATELET
BASOS PCT: 1 %
Basophils Absolute: 69 cells/uL (ref 0–200)
EOS ABS: 621 {cells}/uL — AB (ref 15–500)
EOS PCT: 9 %
HEMATOCRIT: 40.4 % (ref 35.0–45.0)
Hemoglobin: 13 g/dL (ref 11.7–15.5)
LYMPHS ABS: 2760 {cells}/uL (ref 850–3900)
LYMPHS PCT: 40 %
MCH: 31.3 pg (ref 27.0–33.0)
MCHC: 32.2 g/dL (ref 32.0–36.0)
MCV: 97.3 fL (ref 80.0–100.0)
MONO ABS: 552 {cells}/uL (ref 200–950)
MONOS PCT: 8 %
MPV: 10.3 fL (ref 7.5–12.5)
NEUTROS PCT: 42 %
Neutro Abs: 2898 cells/uL (ref 1500–7800)
PLATELETS: 271 10*3/uL (ref 140–400)
RBC: 4.15 MIL/uL (ref 3.80–5.10)
RDW: 13.8 % (ref 11.0–15.0)
WBC: 6.9 10*3/uL (ref 3.8–10.8)

## 2016-11-27 LAB — LIPID PANEL
CHOL/HDL RATIO: 3.1 ratio (ref <5.0)
Cholesterol: 192 mg/dL (ref <200)
HDL: 62 mg/dL (ref >50)
LDL CALC: 119 mg/dL — AB (ref <100)
Triglycerides: 54 mg/dL (ref <150)
VLDL: 11 mg/dL (ref <30)

## 2016-11-27 LAB — TSH: TSH: 0.33 m[IU]/L — AB

## 2016-11-28 ENCOUNTER — Other Ambulatory Visit: Payer: Self-pay | Admitting: *Deleted

## 2016-11-28 DIAGNOSIS — R7989 Other specified abnormal findings of blood chemistry: Secondary | ICD-10-CM

## 2016-11-28 DIAGNOSIS — D72828 Other elevated white blood cell count: Secondary | ICD-10-CM

## 2016-12-05 ENCOUNTER — Encounter: Payer: BLUE CROSS/BLUE SHIELD | Admitting: Gynecology

## 2016-12-11 DIAGNOSIS — J029 Acute pharyngitis, unspecified: Secondary | ICD-10-CM | POA: Diagnosis not present

## 2016-12-11 DIAGNOSIS — J309 Allergic rhinitis, unspecified: Secondary | ICD-10-CM | POA: Diagnosis not present

## 2016-12-11 DIAGNOSIS — J069 Acute upper respiratory infection, unspecified: Secondary | ICD-10-CM | POA: Diagnosis not present

## 2016-12-11 DIAGNOSIS — R0981 Nasal congestion: Secondary | ICD-10-CM | POA: Diagnosis not present

## 2017-01-07 ENCOUNTER — Encounter: Payer: Self-pay | Admitting: Gynecology

## 2017-03-11 DIAGNOSIS — M5412 Radiculopathy, cervical region: Secondary | ICD-10-CM | POA: Diagnosis not present

## 2017-03-23 DIAGNOSIS — M5412 Radiculopathy, cervical region: Secondary | ICD-10-CM | POA: Diagnosis not present

## 2017-03-31 DIAGNOSIS — M542 Cervicalgia: Secondary | ICD-10-CM | POA: Diagnosis not present

## 2017-04-07 DIAGNOSIS — M5412 Radiculopathy, cervical region: Secondary | ICD-10-CM | POA: Diagnosis not present

## 2017-04-10 DIAGNOSIS — M5412 Radiculopathy, cervical region: Secondary | ICD-10-CM | POA: Diagnosis not present

## 2017-04-10 DIAGNOSIS — M542 Cervicalgia: Secondary | ICD-10-CM | POA: Diagnosis not present

## 2017-07-28 ENCOUNTER — Ambulatory Visit: Payer: BLUE CROSS/BLUE SHIELD | Admitting: Obstetrics & Gynecology

## 2017-07-28 ENCOUNTER — Encounter: Payer: Self-pay | Admitting: Obstetrics & Gynecology

## 2017-07-28 VITALS — BP 120/78

## 2017-07-28 DIAGNOSIS — Z113 Encounter for screening for infections with a predominantly sexual mode of transmission: Secondary | ICD-10-CM | POA: Diagnosis not present

## 2017-07-28 DIAGNOSIS — N898 Other specified noninflammatory disorders of vagina: Secondary | ICD-10-CM

## 2017-07-28 DIAGNOSIS — Z23 Encounter for immunization: Secondary | ICD-10-CM

## 2017-07-28 LAB — WET PREP FOR TRICH, YEAST, CLUE

## 2017-07-28 MED ORDER — EPINEPHRINE 0.3 MG/0.3ML IJ SOAJ: 0.3000 mg | Freq: Once | INTRAMUSCULAR | 1 refills | Status: AC

## 2017-07-28 NOTE — Addendum Note (Signed)
Addended by: Lorine Bears on: 07/28/2017 01:08 PM   Modules accepted: Orders

## 2017-07-28 NOTE — Addendum Note (Signed)
Addended by: Lorine Bears on: 07/28/2017 12:21 PM   Modules accepted: Orders

## 2017-07-28 NOTE — Patient Instructions (Signed)
1. Vaginal odor Wet prep negative, no Bacterial Vaginosis, no Yeast Vaginitis.  Recommend trying Probiotic (Lactobacilli) tablet vaginally once a week as needed. - Urinalysis with Culture Reflex - WET PREP FOR TRICH, YEAST, CLUE  2. Screen for STD (sexually transmitted disease) -Gono-Chlam done  3. Need for immunization against influenza  - Flu Vaccine QUAD 36+ mos IM  Jeanelle, nice meeting you today!

## 2017-07-28 NOTE — Progress Notes (Signed)
    [name] 1971/01/28 [id number]        46 y.o.  [id number]  Married.  Patient is a Librarian, academic.  Her mother is a Research officer, trade union.  Daughter middle school.  Son Equities trader in [employer].  RP:  Vaginal odor for a few weeks  HPI: Complains of vaginal discharge with odor for a few weeks.  Increase bad odor after intercourse.  No vaginal itching.  No pelvic pain.  No fever.  No urinary tract infection symptoms.  Bowel movements normal.  Status post LAVH.  Previous history of bacterial vaginosis.  Past medical history,surgical history, problem list, medications, allergies, family history and social history were all reviewed and documented in the EPIC chart.  Directed ROS with pertinent positives and negatives documented in the history of present illness/assessment and plan.  Exam:  Vitals:   07/28/17 1141  BP: 120/78   General appearance:  Normal  Gyn exam: Vulva normal.  Speculum exam: Vagina normal with increased vaginal discharge.  Wet prep done.  Gonorrhea and Chlamydia done.  Wet prep: Negative  U/A negative   Assessment/Plan:  46 y.o. G2P1102   1. Vaginal odor Wet prep negative, no Bacterial Vaginosis, no Yeast Vaginitis.  Recommend trying Probiotic (Lactobacilli) tablet vaginally once a week as needed. - Urinalysis with Culture Reflex - WET PREP FOR TRICH, YEAST, CLUE  2. Screen for STD (sexually transmitted disease) -Gono-Chlam done  3. Need for immunization against influenza  - Flu Vaccine QUAD 36+ mos IM  Counseling on above issues more than 50% for 15 minutes.  Princess Bruins MD, 11:53 AM 07/28/2017

## 2017-07-29 LAB — C. TRACHOMATIS/N. GONORRHOEAE RNA
C. TRACHOMATIS RNA, TMA: NOT DETECTED
N. GONORRHOEAE RNA, TMA: NOT DETECTED

## 2017-07-29 LAB — URINALYSIS W MICROSCOPIC + REFLEX CULTURE
BILIRUBIN URINE: NEGATIVE
Glucose, UA: NEGATIVE
Hgb urine dipstick: NEGATIVE
Hyaline Cast: NONE SEEN /LPF
Ketones, ur: NEGATIVE
LEUKOCYTE ESTERASE: NEGATIVE
Nitrites, Initial: NEGATIVE
PH: 6 (ref 5.0–8.0)
Protein, ur: NEGATIVE
RBC / HPF: NONE SEEN /HPF (ref 0–2)
SPECIFIC GRAVITY, URINE: 1.025 (ref 1.001–1.03)
WBC UA: NONE SEEN /HPF (ref 0–5)

## 2017-07-29 LAB — NO CULTURE INDICATED

## 2017-08-06 DIAGNOSIS — L720 Epidermal cyst: Secondary | ICD-10-CM | POA: Diagnosis not present

## 2017-11-20 ENCOUNTER — Other Ambulatory Visit: Payer: Self-pay | Admitting: Obstetrics & Gynecology

## 2017-11-20 DIAGNOSIS — Z1231 Encounter for screening mammogram for malignant neoplasm of breast: Secondary | ICD-10-CM

## 2017-11-25 ENCOUNTER — Encounter: Payer: BLUE CROSS/BLUE SHIELD | Admitting: Obstetrics & Gynecology

## 2017-11-26 ENCOUNTER — Ambulatory Visit: Payer: BLUE CROSS/BLUE SHIELD | Admitting: Obstetrics & Gynecology

## 2017-11-26 ENCOUNTER — Encounter: Payer: Self-pay | Admitting: Obstetrics & Gynecology

## 2017-11-26 VITALS — BP 128/84 | Ht 67.0 in | Wt 187.0 lb

## 2017-11-26 DIAGNOSIS — Z01411 Encounter for gynecological examination (general) (routine) with abnormal findings: Secondary | ICD-10-CM

## 2017-11-26 DIAGNOSIS — E6609 Other obesity due to excess calories: Secondary | ICD-10-CM | POA: Diagnosis not present

## 2017-11-26 DIAGNOSIS — Z90710 Acquired absence of both cervix and uterus: Secondary | ICD-10-CM

## 2017-11-26 DIAGNOSIS — M9901 Segmental and somatic dysfunction of cervical region: Secondary | ICD-10-CM | POA: Diagnosis not present

## 2017-11-26 DIAGNOSIS — Z6832 Body mass index (BMI) 32.0-32.9, adult: Secondary | ICD-10-CM

## 2017-11-26 DIAGNOSIS — M40292 Other kyphosis, cervical region: Secondary | ICD-10-CM | POA: Diagnosis not present

## 2017-11-26 DIAGNOSIS — M9903 Segmental and somatic dysfunction of lumbar region: Secondary | ICD-10-CM | POA: Diagnosis not present

## 2017-11-26 DIAGNOSIS — M5137 Other intervertebral disc degeneration, lumbosacral region: Secondary | ICD-10-CM | POA: Diagnosis not present

## 2017-11-26 NOTE — Progress Notes (Signed)
[name] 1971-06-25 [id number]   History:    47 y.o. G2P2L2 Married.  Assistant principal.  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  Daughter in 8th grade.  RP:  Established patient presenting for annual gyn exam   HPI: S/P LAVH LSO 09/2015 for Endometriosis.  No pelvic pain.  Normal vaginal secretions.  No pain with intercourse.  Urine and bowel movements normal.  Breasts normal.  Planning her screening mammogram this month.  BMI 32.10.  Concerned about her weight gain, will start a low Calorie/carb diet with increased physical activity.  Past medical history,surgical history, family history and social history were all reviewed and documented in the EPIC chart.  Gynecologic History Patient's last menstrual period was 09/11/2015. Contraception: status post hysterectomy Last Pap: 04/2014. Results were: Negative Last mammogram: 11/2016. Results were: Negative Bone Density: Never Colonoscopy: Never  Obstetric History OB History  Gravida Para Term Preterm AB Living  _0 SAB TAB Ectopic Multiple Live Births          2    # Outcome Date GA Lbr Len/2nd Weight Sex Delivery Anes PTL Lv  2 Preterm     F CS-Unspec  Y LIV  1 Term     M CS-Unspec  N LIV     ROS: A ROS was performed and pertinent positives and negatives are included in the history.  GENERAL: No fevers or chills. HEENT: No change in vision, no earache, sore throat or sinus congestion. NECK: No pain or stiffness. CARDIOVASCULAR: No chest pain or pressure. No palpitations. PULMONARY: No shortness of breath, cough or wheeze. GASTROINTESTINAL: No abdominal pain, nausea, vomiting or diarrhea, melena or bright red blood per rectum. GENITOURINARY: No urinary frequency, urgency, hesitancy or dysuria. MUSCULOSKELETAL: No joint or muscle pain, no back pain, no recent trauma. DERMATOLOGIC: No rash, no itching, no lesions. ENDOCRINE: No polyuria, polydipsia, no heat or cold intolerance. No recent change in weight.  HEMATOLOGICAL: No anemia or easy bruising or bleeding. NEUROLOGIC: No headache, seizures, numbness, tingling or weakness. PSYCHIATRIC: No depression, no loss of interest in normal activity or change in sleep pattern.     Exam:   BP 128/84   Ht _1  (1.626 m)   Wt 187 lb (84.8 kg)   LMP 09/11/2015   BMI 32.10 kg/m   Body mass index is 32.1 kg/m.  General appearance : Well developed well nourished female. No acute distress HEENT: Eyes: no retinal hemorrhage or exudates,  Neck supple, trachea midline, no carotid bruits, no thyroidmegaly Lungs: Clear to auscultation, no rhonchi or wheezes, or rib retractions  Heart: Regular rate and rhythm, no murmurs or gallops Breast:Examined in sitting and supine position were symmetrical in appearance, no palpable masses or tenderness,  no skin retraction, no nipple inversion, no nipple discharge, no skin discoloration, no axillary or supraclavicular lymphadenopathy Abdomen: no palpable masses or tenderness, no rebound or guarding Extremities: no edema or skin discoloration or tenderness  Pelvic: Vulva: Normal             Vagina: No gross lesions or discharge.  Pap reflex done  Cervix/Uterus absent  Adnexa  Without masses or tenderness  Anus: Normal   Assessment/Plan:  47 y.o. female for annual exam   1. Encounter for gynecological examination with abnormal finding Normal gynecologic exam status post LAVH LSO.  Pap reflex done on the vaginal vault.  Breasts normal.  Will schedule screening mammogram this month.  Fasting health labs here  today. - CBC - Comp Met (CMET) - Lipid panel - TSH - VITAMIN D 25 Hydroxy (Vit-D Deficiency, Fractures)  2. 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  Normal gynecologic exam and no pelvic pain.  3. Class 1 obesity due to excess calories without serious comorbidity with body mass index (BMI) of 32.0 to 32.9 in adult Recommend low calorie/carb diet, such  as [employer].  Physical aerobic activity 5 times a week and weight lifting every 2 days.  Princess Bruins MD, 8:21 AM 11/26/2017

## 2017-11-26 NOTE — Addendum Note (Signed)
Addended by: Thurnell Garbe A on: 11/26/2017 11:32 AM   Modules accepted: Orders

## 2017-11-26 NOTE — Patient Instructions (Signed)
1. Encounter for gynecological examination with abnormal finding Normal gynecologic exam status post LAVH LSO.  Pap reflex done on the vaginal vault.  Breasts normal.  Will schedule screening mammogram this month.  Fasting health labs here today. - CBC - Comp Met (CMET) - Lipid panel - TSH - VITAMIN D 25 Hydroxy (Vit-D Deficiency, Fractures)  2. 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  Normal gynecologic exam and no pelvic pain.  3. Class 1 obesity due to excess calories without serious comorbidity with body mass index (BMI) of 32.0 to 32.9 in adult Recommend low calorie/carb diet, such as [employer].  Physical aerobic activity 5 times a week and weight lifting every 2 days.  [name], it was a pleasure seeing you today!  I will inform you of your results as soon as they are available.   Exercising to Lose Weight Exercising can help you to lose weight. In order to lose weight through exercise, you need to do vigorous-intensity exercise. You can tell that you are exercising with vigorous intensity if you are breathing very hard and fast and cannot hold a conversation while exercising. Moderate-intensity exercise helps to maintain your current weight. You can tell that you are exercising at a moderate level if you have a higher heart rate and faster breathing, but you are still able to hold a conversation. How often should I exercise? Choose an activity that you enjoy and set realistic goals. Your health care provider can help you to make an activity plan that works for you. Exercise regularly as directed by your health care provider. This may include:  Doing resistance training twice each week, such as: ? Push-ups. ? Sit-ups. ? Lifting weights. ? Using resistance bands.  Doing a given intensity of exercise for a given amount of time. Choose from these options: ? 150 minutes of moderate-intensity exercise every week. ? 75  minutes of vigorous-intensity exercise every week. ? A mix of moderate-intensity and vigorous-intensity exercise every week.  Children, pregnant women, people who are out of shape, people who are overweight, and older adults may need to consult a health care provider for individual recommendations. If you have any sort of medical condition, be sure to consult your health care provider before starting a new exercise program. What are some activities that can help me to lose weight?  Walking at a rate of at least 4.5 miles an hour.  Jogging or running at a rate of 5 miles per hour.  Biking at a rate of at least 10 miles per hour.  Lap swimming.  Roller-skating or in-line skating.  Cross-country skiing.  Vigorous competitive sports, such as football, basketball, and soccer.  Jumping rope.  Aerobic dancing. How can I be more active in my day-to-day activities?  Use the stairs instead of the elevator.  Take a walk during your lunch break.  If you drive, park your car farther away from work or school.  If you take public transportation, get off one stop early and walk the rest of the way.  Make all of your phone calls while standing up and walking around.  Get up, stretch, and walk around every 30 minutes throughout the day. What guidelines should I follow while exercising?  Do not exercise so much that you hurt yourself, feel dizzy, or get very short of breath.  Consult your health care provider prior to starting a new exercise program.  Wear comfortable clothes and shoes with good support.  Drink plenty of water while you exercise to prevent dehydration or heat stroke. Body water is lost during exercise and must be replaced.  Work out until you breathe faster and your heart beats faster. This information is not intended to replace advice given to you by your health care provider. Make sure you discuss any questions you have with your health care provider. Document  Released: 09/13/2010 Document Revised: 01/17/2016 Document Reviewed: 01/12/2014 Elsevier Interactive Patient Education  2018 [place] Maintenance, Female Adopting a healthy lifestyle and getting preventive care can go a long way to promote health and wellness. Talk with your health care provider about what schedule of regular examinations is right for you. This is a good chance for you to check in with your provider about disease prevention and staying healthy. In between checkups, there are plenty of things you can do on your own. Experts have done a lot of research about which lifestyle changes and preventive measures are most likely to keep you healthy. Ask your health care provider for more information. Weight and diet Eat a healthy diet  Be sure to include plenty of vegetables, fruits, low-fat dairy products, and lean protein.  Do not eat a lot of foods high in solid fats, added sugars, or salt.  Get regular exercise. This is one of the most important things you can do for your health. ? Most adults should exercise for at least 150 minutes each week. The exercise should increase your heart rate and make you sweat (moderate-intensity exercise). ? Most adults should also do strengthening exercises at least twice a week. This is in addition to the moderate-intensity exercise.  Maintain a healthy weight  Body mass index (BMI) is a measurement that can be used to identify possible weight problems. It estimates body fat based on height and weight. Your health care provider can help determine your BMI and help you achieve or maintain a healthy weight.  For females 2 years of age and older: ? A BMI below 18.5 is considered underweight. ? A BMI of 18.5 to 24.9 is normal. ? A BMI of 25 to 29.9 is considered overweight. ? A BMI of 30 and above is considered obese.  Watch levels of cholesterol and blood lipids  You should start having your blood tested for lipids and cholesterol at  47 years of age, then have this test every 5 years.  You may need to have your cholesterol levels checked more often if: ? Your lipid or cholesterol levels are high. ? You are older than 47 years of age. ? You are at high risk for heart disease.  Cancer screening Lung Cancer  Lung cancer screening is recommended for adults 30-81 years old who are at high risk for lung cancer because of a history of smoking.  A yearly low-dose CT scan of the lungs is recommended for people who: ? Currently smoke. ? Have quit within the past 15 years. ? Have at least a 30-pack-year history of smoking. A pack year is smoking an average of one pack of cigarettes a day for 1 year.  Yearly screening should continue until it has been 15 years since you quit.  Yearly screening should stop if you develop a health problem that would prevent you from having lung cancer treatment.  Breast Cancer  Practice breast self-awareness. This means understanding how your breasts normally appear and feel.  It also means doing regular breast self-exams. Let your health care provider know about any changes, no  matter how small.  If you are in your 20s or 30s, you should have a clinical breast exam (CBE) by a health care provider every 1-3 years as part of a regular health exam.  If you are 45 or older, have a CBE every year. Also consider having a breast X-ray (mammogram) every year.  If you have a family history of breast cancer, talk to your health care provider about genetic screening.  If you are at high risk for breast cancer, talk to your health care provider about having an MRI and a mammogram every year.  Breast cancer gene (BRCA) assessment is recommended for women who have family members with BRCA-related cancers. BRCA-related cancers include: ? Breast. ? Ovarian. ? Tubal. ? Peritoneal cancers.  Results of the assessment will determine the need for genetic counseling and BRCA1 and BRCA2 testing.  Cervical  Cancer Your health care provider may recommend that you be screened regularly for cancer of the pelvic organs (ovaries, uterus, and vagina). This screening involves a pelvic examination, including checking for microscopic changes to the surface of your cervix (Pap test). You may be encouraged to have this screening done every 3 years, beginning at age 39.  For women ages 30-65, health care providers may recommend pelvic exams and Pap testing every 3 years, or they may recommend the Pap and pelvic exam, combined with testing for human papilloma virus (HPV), every 5 years. Some types of HPV increase your risk of cervical cancer. Testing for HPV may also be done on women of any age with unclear Pap test results.  Other health care providers may not recommend any screening for nonpregnant women who are considered low risk for pelvic cancer and who do not have symptoms. Ask your health care provider if a screening pelvic exam is right for you.  If you have had past treatment for cervical cancer or a condition that could lead to cancer, you need Pap tests and screening for cancer for at least 20 years after your treatment. If Pap tests have been discontinued, your risk factors (such as having a new sexual partner) need to be reassessed to determine if screening should resume. Some women have medical problems that increase the chance of getting cervical cancer. In these cases, your health care provider may recommend more frequent screening and Pap tests.  Colorectal Cancer  This type of cancer can be detected and often prevented.  Routine colorectal cancer screening usually begins at 47 years of age and continues through 47 years of age.  Your health care provider may recommend screening at an earlier age if you have risk factors for colon cancer.  Your health care provider may also recommend using home test kits to check for hidden blood in the stool.  A small camera at the end of a tube can be used to  examine your colon directly (sigmoidoscopy or colonoscopy). This is done to check for the earliest forms of colorectal cancer.  Routine screening usually begins at age 69.  Direct examination of the colon should be repeated every 5-10 years through 47 years of age. However, you may need to be screened more often if early forms of precancerous polyps or small growths are found.  Skin Cancer  Check your skin from head to toe regularly.  Tell your health care provider about any new moles or changes in moles, especially if there is a change in a mole's shape or color.  Also tell your health care provider if you have  a mole that is larger than the size of a pencil eraser.  Always use sunscreen. Apply sunscreen liberally and repeatedly throughout the day.  Protect yourself by wearing long sleeves, pants, a wide-brimmed hat, and sunglasses whenever you are outside.  Heart disease, diabetes, and high blood pressure  High blood pressure causes heart disease and increases the risk of stroke. High blood pressure is more likely to develop in: ? People who have blood pressure in the high end of the normal range (130-139/85-89 mm Hg). ? People who are overweight or obese. ? People who are African American.  If you are 83-31 years of age, have your blood pressure checked every 3-5 years. If you are 11 years of age or older, have your blood pressure checked every year. You should have your blood pressure measured twice-once when you are at a hospital or clinic, and once when you are not at a hospital or clinic. Record the average of the two measurements. To check your blood pressure when you are not at a hospital or clinic, you can use: ? An automated blood pressure machine at a pharmacy. ? A home blood pressure monitor.  If you are between 76 years and 29 years old, ask your health care provider if you should take aspirin to prevent strokes.  Have regular diabetes screenings. This involves taking a  blood sample to check your fasting blood sugar level. ? If you are at a normal weight and have a low risk for diabetes, have this test once every three years after 47 years of age. ? If you are overweight and have a high risk for diabetes, consider being tested at a younger age or more often. Preventing infection Hepatitis B  If you have a higher risk for hepatitis B, you should be screened for this virus. You are considered at high risk for hepatitis B if: ? You were born in a country where hepatitis B is common. Ask your health care provider which countries are considered high risk. ? Your parents were born in a high-risk country, and you have not been immunized against hepatitis B (hepatitis B vaccine). ? You have HIV or AIDS. ? You use needles to inject street drugs. ? You live with someone who has hepatitis B. ? You have had sex with someone who has hepatitis B. ? You get hemodialysis treatment. ? You take certain medicines for conditions, including cancer, organ transplantation, and autoimmune conditions.  Hepatitis C  Blood testing is recommended for: ? Everyone born from [birth year] through 1965. ? Anyone with known risk factors for hepatitis C.  Sexually transmitted infections (STIs)  You should be screened for sexually transmitted infections (STIs) including gonorrhea and chlamydia if: ? You are sexually active and are younger than 47 years of age. ? You are older than 47 years of age and your health care provider tells you that you are at risk for this type of infection. ? Your sexual activity has changed since you were last screened and you are at an increased risk for chlamydia or gonorrhea. Ask your health care provider if you are at risk.  If you do not have HIV, but are at risk, it may be recommended that you take a prescription medicine daily to prevent HIV infection. This is called pre-exposure prophylaxis (PrEP). You are considered at risk if: ? You are sexually active and  do not regularly use condoms or know the HIV status of your partner(s). ? You take drugs by injection. ?  You are sexually active with a partner who has HIV.  Talk with your health care provider about whether you are at high risk of being infected with HIV. If you choose to begin PrEP, you should first be tested for HIV. You should then be tested every 3 months for as long as you are taking PrEP. Pregnancy  If you are premenopausal and you may become pregnant, ask your health care provider about preconception counseling.  If you may become pregnant, take 400 to 800 micrograms (mcg) of folic acid every day.  If you want to prevent pregnancy, talk to your health care provider about birth control (contraception). Osteoporosis and menopause  Osteoporosis is a disease in which the bones lose minerals and strength with aging. This can result in serious bone fractures. Your risk for osteoporosis can be identified using a bone density scan.  If you are 62 years of age or older, or if you are at risk for osteoporosis and fractures, ask your health care provider if you should be screened.  Ask your health care provider whether you should take a calcium or vitamin D supplement to lower your risk for osteoporosis.  Menopause may have certain physical symptoms and risks.  Hormone replacement therapy may reduce some of these symptoms and risks. Talk to your health care provider about whether hormone replacement therapy is right for you. Follow these instructions at home:  Schedule regular health, dental, and eye exams.  Stay current with your immunizations.  Do not use any tobacco products including cigarettes, chewing tobacco, or electronic cigarettes.  If you are pregnant, do not drink alcohol.  If you are breastfeeding, limit how much and how often you drink alcohol.  Limit alcohol intake to no more than 1 drink per day for nonpregnant women. One drink equals 12 ounces of beer, 5 ounces of  wine, or 1 ounces of hard liquor.  Do not use street drugs.  Do not share needles.  Ask your health care provider for help if you need support or information about quitting drugs.  Tell your health care provider if you often feel depressed.  Tell your health care provider if you have ever been abused or do not feel safe at home. This information is not intended to replace advice given to you by your health care provider. Make sure you discuss any questions you have with your health care provider. Document Released: 02/24/2011 Document Revised: 01/17/2016 Document Reviewed: 05/15/2015 Elsevier Interactive Patient Education  [employer].

## 2017-11-27 LAB — COMPREHENSIVE METABOLIC PANEL
AG RATIO: 1.5 (calc) (ref 1.0–2.5)
ALKALINE PHOSPHATASE (APISO): 63 U/L (ref 33–115)
ALT: 14 U/L (ref 6–29)
AST: 14 U/L (ref 10–35)
Albumin: 4.3 g/dL (ref 3.6–5.1)
BILIRUBIN TOTAL: 0.6 mg/dL (ref 0.2–1.2)
BUN: 21 mg/dL (ref 7–25)
CALCIUM: 9.1 mg/dL (ref 8.6–10.2)
CHLORIDE: 105 mmol/L (ref 98–110)
CO2: 24 mmol/L (ref 20–32)
Creat: 0.84 mg/dL (ref 0.50–1.10)
GLOBULIN: 2.8 g/dL (ref 1.9–3.7)
Glucose, Bld: 87 mg/dL (ref 65–99)
POTASSIUM: 3.7 mmol/L (ref 3.5–5.3)
Sodium: 139 mmol/L (ref 135–146)
Total Protein: 7.1 g/dL (ref 6.1–8.1)

## 2017-11-27 LAB — TSH: TSH: 0.45 mIU/L

## 2017-11-27 LAB — CBC
HEMATOCRIT: 38.6 % (ref 35.0–45.0)
HEMOGLOBIN: 13.2 g/dL (ref 11.7–15.5)
MCH: 31.3 pg (ref 27.0–33.0)
MCHC: 34.2 g/dL (ref 32.0–36.0)
MCV: 91.5 fL (ref 80.0–100.0)
MPV: 11 fL (ref 7.5–12.5)
Platelets: 263 10*3/uL (ref 140–400)
RBC: 4.22 10*6/uL (ref 3.80–5.10)
RDW: 12.3 % (ref 11.0–15.0)
WBC: 10.8 10*3/uL (ref 3.8–10.8)

## 2017-11-27 LAB — LIPID PANEL
CHOLESTEROL: 172 mg/dL (ref <200)
HDL: 58 mg/dL (ref >50)
LDL Cholesterol (Calc): 100 mg/dL (calc) — ABNORMAL HIGH
Non-HDL Cholesterol (Calc): 114 mg/dL (calc) (ref <130)
Total CHOL/HDL Ratio: 3 (calc) (ref <5.0)
Triglycerides: 57 mg/dL (ref <150)

## 2017-11-27 LAB — VITAMIN D 25 HYDROXY (VIT D DEFICIENCY, FRACTURES): Vit D, 25-Hydroxy: 26 ng/mL — ABNORMAL LOW (ref 30–100)

## 2017-11-30 DIAGNOSIS — M40292 Other kyphosis, cervical region: Secondary | ICD-10-CM | POA: Diagnosis not present

## 2017-11-30 DIAGNOSIS — M9903 Segmental and somatic dysfunction of lumbar region: Secondary | ICD-10-CM | POA: Diagnosis not present

## 2017-11-30 DIAGNOSIS — M5137 Other intervertebral disc degeneration, lumbosacral region: Secondary | ICD-10-CM | POA: Diagnosis not present

## 2017-11-30 DIAGNOSIS — M9901 Segmental and somatic dysfunction of cervical region: Secondary | ICD-10-CM | POA: Diagnosis not present

## 2017-11-30 LAB — PAP IG W/ RFLX HPV ASCU

## 2017-12-04 DIAGNOSIS — M9901 Segmental and somatic dysfunction of cervical region: Secondary | ICD-10-CM | POA: Diagnosis not present

## 2017-12-04 DIAGNOSIS — M40292 Other kyphosis, cervical region: Secondary | ICD-10-CM | POA: Diagnosis not present

## 2017-12-04 DIAGNOSIS — M5137 Other intervertebral disc degeneration, lumbosacral region: Secondary | ICD-10-CM | POA: Diagnosis not present

## 2017-12-04 DIAGNOSIS — M9903 Segmental and somatic dysfunction of lumbar region: Secondary | ICD-10-CM | POA: Diagnosis not present

## 2017-12-07 DIAGNOSIS — M25512 Pain in left shoulder: Secondary | ICD-10-CM | POA: Diagnosis not present

## 2017-12-08 ENCOUNTER — Encounter: Payer: Self-pay | Admitting: Obstetrics & Gynecology

## 2017-12-10 ENCOUNTER — Ambulatory Visit
Admission: RE | Admit: 2017-12-10 | Discharge: 2017-12-10 | Disposition: A | Discharge status: Home / Self Care | Payer: BLUE CROSS/BLUE SHIELD | Source: Ambulatory Visit | Attending: Obstetrics & Gynecology | Admitting: Obstetrics & Gynecology

## 2017-12-10 ENCOUNTER — Ambulatory Visit: Payer: BLUE CROSS/BLUE SHIELD

## 2017-12-10 DIAGNOSIS — Z1231 Encounter for screening mammogram for malignant neoplasm of breast: Secondary | ICD-10-CM

## 2018-04-27 ENCOUNTER — Other Ambulatory Visit: Payer: Self-pay

## 2018-04-27 ENCOUNTER — Inpatient Hospital Stay (HOSPITAL_COMMUNITY)
Admission: EM | Admit: 2018-04-27 | Discharge: 2018-05-01 | DRG: 315 | Disposition: A | Discharge status: Home / Self Care | Payer: BLUE CROSS/BLUE SHIELD | Attending: Internal Medicine | Admitting: Internal Medicine

## 2018-04-27 ENCOUNTER — Emergency Department (HOSPITAL_COMMUNITY): Payer: BLUE CROSS/BLUE SHIELD

## 2018-04-27 ENCOUNTER — Encounter (HOSPITAL_COMMUNITY): Payer: Self-pay | Admitting: Emergency Medicine

## 2018-04-27 DIAGNOSIS — Z8249 Family history of ischemic heart disease and other diseases of the circulatory system: Secondary | ICD-10-CM

## 2018-04-27 DIAGNOSIS — E785 Hyperlipidemia, unspecified: Secondary | ICD-10-CM | POA: Diagnosis not present

## 2018-04-27 DIAGNOSIS — Z79899 Other long term (current) drug therapy: Secondary | ICD-10-CM

## 2018-04-27 DIAGNOSIS — I5030 Unspecified diastolic (congestive) heart failure: Secondary | ICD-10-CM | POA: Diagnosis not present

## 2018-04-27 DIAGNOSIS — Z91048 Other nonmedicinal substance allergy status: Secondary | ICD-10-CM | POA: Diagnosis not present

## 2018-04-27 DIAGNOSIS — R6510 Systemic inflammatory response syndrome (SIRS) of non-infectious origin without acute organ dysfunction: Secondary | ICD-10-CM | POA: Diagnosis not present

## 2018-04-27 DIAGNOSIS — E559 Vitamin D deficiency, unspecified: Secondary | ICD-10-CM | POA: Diagnosis not present

## 2018-04-27 DIAGNOSIS — Z90710 Acquired absence of both cervix and uterus: Secondary | ICD-10-CM | POA: Diagnosis not present

## 2018-04-27 DIAGNOSIS — H6122 Impacted cerumen, left ear: Secondary | ICD-10-CM | POA: Diagnosis present

## 2018-04-27 DIAGNOSIS — M25512 Pain in left shoulder: Secondary | ICD-10-CM | POA: Diagnosis not present

## 2018-04-27 DIAGNOSIS — R778 Other specified abnormalities of plasma proteins: Secondary | ICD-10-CM | POA: Diagnosis present

## 2018-04-27 DIAGNOSIS — R9431 Abnormal electrocardiogram [ECG] [EKG]: Secondary | ICD-10-CM | POA: Diagnosis not present

## 2018-04-27 DIAGNOSIS — M25519 Pain in unspecified shoulder: Secondary | ICD-10-CM

## 2018-04-27 DIAGNOSIS — R071 Chest pain on breathing: Secondary | ICD-10-CM | POA: Diagnosis not present

## 2018-04-27 DIAGNOSIS — Z7289 Other problems related to lifestyle: Secondary | ICD-10-CM | POA: Diagnosis not present

## 2018-04-27 DIAGNOSIS — I309 Acute pericarditis, unspecified: Secondary | ICD-10-CM | POA: Diagnosis present

## 2018-04-27 DIAGNOSIS — R05 Cough: Secondary | ICD-10-CM | POA: Diagnosis not present

## 2018-04-27 DIAGNOSIS — G629 Polyneuropathy, unspecified: Secondary | ICD-10-CM | POA: Diagnosis not present

## 2018-04-27 DIAGNOSIS — R748 Abnormal levels of other serum enzymes: Secondary | ICD-10-CM | POA: Diagnosis not present

## 2018-04-27 DIAGNOSIS — R079 Chest pain, unspecified: Secondary | ICD-10-CM | POA: Diagnosis not present

## 2018-04-27 DIAGNOSIS — I319 Disease of pericardium, unspecified: Secondary | ICD-10-CM | POA: Diagnosis present

## 2018-04-27 DIAGNOSIS — Z888 Allergy status to other drugs, medicaments and biological substances status: Secondary | ICD-10-CM | POA: Diagnosis not present

## 2018-04-27 DIAGNOSIS — I514 Myocarditis, unspecified: Secondary | ICD-10-CM | POA: Diagnosis not present

## 2018-04-27 DIAGNOSIS — I308 Other forms of acute pericarditis: Secondary | ICD-10-CM | POA: Diagnosis not present

## 2018-04-27 DIAGNOSIS — R0789 Other chest pain: Secondary | ICD-10-CM | POA: Diagnosis not present

## 2018-04-27 DIAGNOSIS — D649 Anemia, unspecified: Secondary | ICD-10-CM | POA: Diagnosis present

## 2018-04-27 DIAGNOSIS — R7989 Other specified abnormal findings of blood chemistry: Secondary | ICD-10-CM

## 2018-04-27 DIAGNOSIS — I214 Non-ST elevation (NSTEMI) myocardial infarction: Secondary | ICD-10-CM | POA: Diagnosis present

## 2018-04-27 DIAGNOSIS — H9202 Otalgia, left ear: Secondary | ICD-10-CM | POA: Diagnosis not present

## 2018-04-27 DIAGNOSIS — R0602 Shortness of breath: Secondary | ICD-10-CM | POA: Diagnosis not present

## 2018-04-27 DIAGNOSIS — I300 Acute nonspecific idiopathic pericarditis: Secondary | ICD-10-CM | POA: Diagnosis not present

## 2018-04-27 HISTORY — DX: Other specified abnormal findings of blood chemistry: R79.89

## 2018-04-27 LAB — BASIC METABOLIC PANEL
Anion gap: 12 (ref 5–15)
BUN: 9 mg/dL (ref 6–20)
CALCIUM: 9.4 mg/dL (ref 8.9–10.3)
CO2: 25 mmol/L (ref 22–32)
CREATININE: 0.86 mg/dL (ref 0.44–1.00)
Chloride: 103 mmol/L (ref 98–111)
GFR calc non Af Amer: >60 mL/min (ref >60)
GLUCOSE: 139 mg/dL — AB (ref 70–99)
Potassium: 3.8 mmol/L (ref 3.5–5.1)
Sodium: 140 mmol/L (ref 135–145)

## 2018-04-27 LAB — CBC
HCT: 45.4 % (ref 36.0–46.0)
Hemoglobin: 14.2 g/dL (ref 12.0–15.0)
MCH: 30.9 pg (ref 26.0–34.0)
MCHC: 31.3 g/dL (ref 30.0–36.0)
MCV: 98.7 fL (ref 78.0–100.0)
PLATELETS: 227 10*3/uL (ref 150–400)
RBC: 4.6 MIL/uL (ref 3.87–5.11)
RDW: 13.5 % (ref 11.5–15.5)
WBC: 15.1 10*3/uL — ABNORMAL HIGH (ref 4.0–10.5)

## 2018-04-27 LAB — D-DIMER, QUANTITATIVE: D-Dimer, Quant: 1.36 ug/mL-FEU — ABNORMAL HIGH (ref 0.00–0.50)

## 2018-04-27 LAB — I-STAT TROPONIN, ED: TROPONIN I, POC: 0.07 ng/mL (ref 0.00–0.08)

## 2018-04-27 LAB — PROTIME-INR
INR: 1.11
Prothrombin Time: 14.2 seconds (ref 11.4–15.2)

## 2018-04-27 LAB — C-REACTIVE PROTEIN: CRP: 11.9 mg/dL — AB (ref <1.0)

## 2018-04-27 LAB — SEDIMENTATION RATE: SED RATE: 30 mm/h — AB (ref 0–22)

## 2018-04-27 MED ORDER — LORATADINE 10 MG PO TABS
10.0000 mg | ORAL_TABLET | Freq: Every day | ORAL | Status: DC
  Administered 2018-04-30 – 2018-05-01 (×2): 10 mg via ORAL
  Filled 2018-04-27 (×4): qty 1

## 2018-04-27 MED ORDER — OXYCODONE-ACETAMINOPHEN 5-325 MG PO TABS
1.0000 | ORAL_TABLET | ORAL | Status: DC | PRN
  Administered 2018-04-28 – 2018-05-01 (×9): 1 via ORAL
  Filled 2018-04-27 (×10): qty 1

## 2018-04-27 MED ORDER — COLCHICINE 0.6 MG PO TABS
0.6000 mg | ORAL_TABLET | Freq: Two times a day (BID) | ORAL | Status: DC
  Administered 2018-04-28 – 2018-05-01 (×8): 0.6 mg via ORAL
  Filled 2018-04-27 (×8): qty 1

## 2018-04-27 MED ORDER — LEVALBUTEROL HCL 1.25 MG/0.5ML IN NEBU
1.2500 mg | INHALATION_SOLUTION | Freq: Four times a day (QID) | RESPIRATORY_TRACT | Status: DC
  Administered 2018-04-28 (×2): 1.25 mg via RESPIRATORY_TRACT
  Filled 2018-04-27 (×2): qty 0.5

## 2018-04-27 MED ORDER — MORPHINE SULFATE (PF) 2 MG/ML IV SOLN
2.0000 mg | INTRAVENOUS | Status: DC | PRN
  Filled 2018-04-27: qty 1

## 2018-04-27 MED ORDER — COLCHICINE 0.6 MG PO TABS
0.6000 mg | ORAL_TABLET | Freq: Once | ORAL | Status: AC
  Administered 2018-04-27: 0.6 mg via ORAL
  Filled 2018-04-27: qty 1

## 2018-04-27 MED ORDER — ZOLPIDEM TARTRATE 5 MG PO TABS: 5.0000 mg | ORAL_TABLET | Freq: Every evening | ORAL | Status: DC | PRN

## 2018-04-27 MED ORDER — ONDANSETRON HCL 4 MG/2ML IJ SOLN
4.0000 mg | Freq: Four times a day (QID) | INTRAMUSCULAR | Status: DC | PRN
  Administered 2018-04-29: 4 mg via INTRAVENOUS
  Filled 2018-04-27: qty 2

## 2018-04-27 MED ORDER — HEPARIN BOLUS VIA INFUSION
4000.0000 [IU] | Freq: Once | INTRAVENOUS | Status: AC
  Administered 2018-04-27: 4000 [IU] via INTRAVENOUS
  Filled 2018-04-27: qty 4000

## 2018-04-27 MED ORDER — HEPARIN (PORCINE) IN NACL 100-0.45 UNIT/ML-% IJ SOLN
950.0000 [IU]/h | INTRAMUSCULAR | Status: DC
  Administered 2018-04-27: 1350 [IU]/h via INTRAVENOUS
  Filled 2018-04-27: qty 250

## 2018-04-27 MED ORDER — ACETAMINOPHEN 325 MG PO TABS: 650.0000 mg | ORAL_TABLET | ORAL | Status: DC | PRN

## 2018-04-27 MED ORDER — ADULT MULTIVITAMIN W/MINERALS CH
1.0000 | ORAL_TABLET | Freq: Every day | ORAL | Status: DC
  Administered 2018-04-29 – 2018-05-01 (×3): 1 via ORAL
  Filled 2018-04-27 (×4): qty 1

## 2018-04-27 MED ORDER — CALCIUM CARBONATE-VITAMIN D 500-200 MG-UNIT PO TABS
1.0000 | ORAL_TABLET | Freq: Every day | ORAL | Status: DC
  Administered 2018-04-28 – 2018-05-01 (×4): 1 via ORAL
  Filled 2018-04-27 (×4): qty 1

## 2018-04-27 MED ORDER — VITAMIN D 1000 UNITS PO TABS
1000.0000 [IU] | ORAL_TABLET | Freq: Every day | ORAL | Status: DC
  Administered 2018-04-28 – 2018-05-01 (×4): 1000 [IU] via ORAL
  Filled 2018-04-27 (×4): qty 1

## 2018-04-27 MED ORDER — IBUPROFEN 400 MG PO TABS
800.0000 mg | ORAL_TABLET | Freq: Three times a day (TID) | ORAL | Status: DC
  Administered 2018-04-28 – 2018-05-01 (×11): 800 mg via ORAL
  Filled 2018-04-27 (×2): qty 4
  Filled 2018-04-27 (×2): qty 2
  Filled 2018-04-27 (×2): qty 4
  Filled 2018-04-27: qty 2
  Filled 2018-04-27: qty 4
  Filled 2018-04-27: qty 2
  Filled 2018-04-27 (×2): qty 4
  Filled 2018-04-27: qty 2
  Filled 2018-04-27: qty 4
  Filled 2018-04-27 (×2): qty 2
  Filled 2018-04-27: qty 4
  Filled 2018-04-27 (×2): qty 2
  Filled 2018-04-27: qty 4
  Filled 2018-04-27: qty 2
  Filled 2018-04-27: qty 4
  Filled 2018-04-27: qty 2

## 2018-04-27 MED ORDER — DM-GUAIFENESIN ER 30-600 MG PO TB12
1.0000 | ORAL_TABLET | Freq: Two times a day (BID) | ORAL | Status: DC | PRN
  Administered 2018-04-28: 1 via ORAL
  Filled 2018-04-27: qty 1

## 2018-04-27 MED ORDER — HYDRALAZINE HCL 20 MG/ML IJ SOLN: 5.0000 mg | INTRAMUSCULAR | Status: DC | PRN

## 2018-04-27 MED ORDER — ALPRAZOLAM 0.25 MG PO TABS: 0.2500 mg | ORAL_TABLET | Freq: Two times a day (BID) | ORAL | Status: DC | PRN

## 2018-04-27 MED ORDER — VITAMIN B-12 1000 MCG PO TABS
1000.0000 ug | ORAL_TABLET | Freq: Every day | ORAL | Status: DC
  Administered 2018-04-28 – 2018-05-01 (×4): 1000 ug via ORAL
  Filled 2018-04-27 (×4): qty 1

## 2018-04-27 NOTE — ED Triage Notes (Signed)
Patient reports left chest pain radiating to left shoulder with left finger tingling onset 4 days ago with SOB and occasional dry cough , pain increases with deep inspiration , denies emesis or diaphoresis .

## 2018-04-27 NOTE — Progress Notes (Signed)
ANTICOAGULATION CONSULT NOTE - Initial Consult  Pharmacy Consult for Heparin Indication: r/o PE  Allergies  Allergen Reactions  . Iodine Anaphylaxis  . Shellfish Allergy Anaphylaxis    Patient Measurements:   Heparin Dosing Weight: 80 kg  Vital Signs: BP: 125/78 (09/03 2230) Pulse Rate: 106 (09/03 2230)  Labs: Recent Labs    04/27/18 2029  HGB 14.2  HCT 45.4  PLT 227  LABPROT 14.2  INR 1.11  CREATININE 0.86    CrCl cannot be calculated (Unknown ideal weight.).   Medical History: Past Medical History:  Diagnosis Date  . Acne   . Endometriosis   . HELLP (hemolytic anemia/elev liver enzymes/low platelets in pregnancy)    hx of two pregnancies  . Miscarriage   . Normal spontaneous vaginal delivery   . Thyroid dysfunction     Medications:  No current facility-administered medications on file prior to encounter.    Current Outpatient Medications on File Prior to Encounter  Medication Sig Dispense Refill  . BL CALCIUM-MAGNESIUM-ZINC PO Take 1 tablet by mouth daily.    . Calcium Carb-Cholecalciferol (CALCIUM 1000 + D PO) Take 1 tablet by mouth daily.    . cetirizine (ZYRTEC) 10 MG tablet Take 10 mg by mouth daily as needed for allergies.     . cholecalciferol (VITAMIN D) 1000 units tablet Take 1,000 Units by mouth daily.    . clindamycin (CLEOCIN T) 1 % lotion Apply 1 application topically daily as needed (acne). Reported on 01/11/2016    . Cyanocobalamin (VITAMIN B-12 PO) Take 1 tablet by mouth daily.    . Multiple Vitamin (MULTIVITAMIN WITH MINERALS) TABS tablet Take 1 tablet by mouth daily.    . [DISCONTINUED] doxycycline (VIBRAMYCIN) 50 MG capsule Take 2 capsules (100 mg total) by mouth 2 (two) times daily. 14 capsule 0  . [DISCONTINUED] Multiple Vitamins-Minerals (ZINC PO) Take by mouth.      . [DISCONTINUED] NON FORMULARY candidastop 3 times daily       Assessment: 47 y.o. female with chest pain/SOB, possible PE, for heparin Goal of Therapy:  Heparin  level 0.3-0.7 units/ml Monitor platelets by anticoagulation protocol: Yes   Plan:  Heparin 4000 units IV bolus, then start heparin 1350 units/hr Check heparin level in 6 hours.   Caryl Pina 04/27/2018,11:32 PM

## 2018-04-27 NOTE — H&P (Signed)
History and Physical    [name] LTJ:[id number] DOB: May 09, 1971 DOA: 04/27/2018  Referring MD/NP/PA:   PCP: Judge Stall, FNP (Inactive)   Patient coming from:  The patient is coming from home.  At baseline, pt is independent for most of ADL.   Chief Complaint: chest pain and SOB   HPI: [name] is a 47 y.o. female with medical history significant of endometriosis, miscarriage, HELLP syndrome, who presents with chest pain and SOB.  Patient states that she has been having chest pain and shortness of breath for about 4 days. The chest is located in the left side of chest, constant, 9 out of 10 in severity, sharp, radiating to the left shoulder. It isn't pleuritic, aggravated by deep breath. No recent long distant traveling, no tenderness in the calf areas. Patient denies fever or chills. Patient has nausea, no vomiting, diarrhea or abdominal pain. No symptoms of UTI. Patient states that she has been having intermittent tingling in toes and fingers bilaterally, which has been going on for one month. No facial droop, slurred speech. No vision change or hearing loss.  ED Course: pt was found to have troponin 0.75, WBC 15.9, lactic acid 1.5, electrolytes renal function okay, temperature normal, tachycardia, 2, oxygen sat 98% on room air. D-dimer +1.36. Patient is placed on telemetry bed for observation. Cardiology, Dr. Leo Rod was consulted.  Review of Systems:   General: no fevers, chills, no body weight gain, has fatigue HEENT: no blurry vision, hearing changes or sore throat Respiratory: has dyspnea, no coughing, wheezing CV: has chest pain, no palpitations GI: no nausea, vomiting, abdominal pain, diarrhea, constipation GU: no dysuria, burning on urination, increased urinary frequency, hematuria  Ext: no leg edema Neuro: no unilateral weakness, numbness, or tingling, no vision change or hearing loss. Has tingling in toes and fingers bilaterally. Skin: no rash, no skin tear. MSK:  No muscle spasm, no deformity, no limitation of range of movement in spin Heme: No easy bruising.  Travel history: No recent long distant travel.  Allergy:  Allergies  Allergen Reactions  . Iodine Anaphylaxis  . Shellfish Allergy Anaphylaxis    Past Medical History:  Diagnosis Date  . Acne   . Endometriosis   . HELLP (hemolytic anemia/elev liver enzymes/low platelets in pregnancy)    hx of two pregnancies  . Miscarriage   . Normal spontaneous vaginal delivery   . Thyroid dysfunction     Past Surgical History:  Procedure Laterality Date  . BILATERAL SALPINGECTOMY Right 10/16/2015   Procedure: RIGHT  SALPINGECTOMY;  Surgeon: Terrance Mass, MD;  Location: [facility];  Service: Gynecology;  Laterality: Right;  . BUNIONECTOMY Right 11/30/2015   @ [facility]  . BUNIONECTOMY Left 12/21/2015   @ [facility]  . BUNIONECTOMY Left 03/07/2016   '@[facility]'    . CESAREAN SECTION    . CYSTOSCOPY N/A 10/16/2015   Procedure: CYSTOSCOPY;  Surgeon: Terrance Mass, MD;  Location: [facility];  Service: Gynecology;  Laterality: N/A;  . DILATION AND CURETTAGE OF UTERUS    . LAPAROSCOPIC VAGINAL HYSTERECTOMY WITH SALPINGO OOPHORECTOMY Left 10/16/2015   Procedure: LAPAROSCOPIC ASSISTED VAGINAL HYSTERECTOMY WITH SALPINGO OOPHORECTOMY;  Surgeon: Terrance Mass, MD;  Location: [facility];  Service: Gynecology;  Laterality: Left;  . TUBAL LIGATION      Social History:  reports that she has never smoked. She has never used smokeless tobacco. She reports that she drinks alcohol. She reports that she does not use drugs.  Family History:  Family History  Problem Relation  Age of Onset  . Diabetes Mother   . Hypertension Mother   . Thyroid disease Mother   . Cancer Mother        THYROID     Prior to Admission medications   Medication Sig Start Date End Date Taking? Authorizing Provider  BL CALCIUM-MAGNESIUM-ZINC PO Take 1 tablet by mouth daily.   Yes [provider]  Calcium Carb-Cholecalciferol (CALCIUM 1000 + D PO)  Take 1 tablet by mouth daily.   Yes [provider]  cetirizine (ZYRTEC) 10 MG tablet Take 10 mg by mouth daily as needed for allergies.    Yes [provider]  cholecalciferol (VITAMIN D) 1000 units tablet Take 1,000 Units by mouth daily.   Yes [provider]  clindamycin (CLEOCIN T) 1 % lotion Apply 1 application topically daily as needed (acne). Reported on 01/11/2016   Yes [provider]  Cyanocobalamin (VITAMIN B-12 PO) Take 1 tablet by mouth daily.   Yes [provider]  Multiple Vitamin (MULTIVITAMIN WITH MINERALS) TABS tablet Take 1 tablet by mouth daily.   Yes [provider]    Physical Exam: Vitals:   04/28/18 0032 04/28/18 0131 04/28/18 0313 04/28/18 0314  BP: (!) 119/92   97/62  Pulse:    96  Resp:    (!) 26  Temp: 98.4 F (36.9 C)  98.6 F (37 C)   TempSrc: Oral  Oral   SpO2:  97%  90%  Weight: 80 kg     Height: 5' 7.5" (1.715 m)      General: Not in acute distress HEENT:       Eyes: PERRL, EOMI, no scleral icterus.       ENT: No discharge from the ears and nose, no pharynx injection, no tonsillar enlargement.        Neck: No JVD, no bruit, no mass felt. Heme: No neck lymph node enlargement. Cardiac: S1/S2, RRR, No murmurs, No gallops or rubs. Respiratory:  No rales, wheezing, rhonchi or rubs. GI: Soft, nondistended, nontender, no rebound pain, no organomegaly, BS present. GU: No hematuria Ext: No pitting leg edema bilaterally. 2+DP/PT pulse bilaterally. Musculoskeletal: No joint deformities, No joint redness or warmth, no limitation of ROM in spin. Skin: No rashes.  Neuro: Alert, oriented X3, cranial nerves II-XII grossly intact, moves all extremities normally.  Psych: Patient is not psychotic, no suicidal or hemocidal ideation.  Labs on Admission: I have personally reviewed following labs and imaging studies  CBC: Recent Labs  Lab 04/27/18 2029  WBC 15.1*  HGB 14.2  HCT 45.4  MCV 98.7  PLT 347    Basic Metabolic Panel: Recent Labs  Lab 04/27/18 2029  NA 140  K 3.8  CL 103  CO2 25  GLUCOSE 139*  BUN 9  CREATININE 0.86  CALCIUM 9.4   GFR: Estimated Creatinine Clearance: 89.9 mL/min (by C-G formula based on SCr of 0.86 mg/dL). Liver Function Tests: No results for input(s): AST, ALT, ALKPHOS, BILITOT, PROT, ALBUMIN in the last 168 hours. No results for input(s): LIPASE, AMYLASE in the last 168 hours. No results for input(s): AMMONIA in the last 168 hours. Coagulation Profile: Recent Labs  Lab 04/27/18 2029  INR 1.11   Cardiac Enzymes: Recent Labs  Lab 04/28/18 0047 04/28/18 0514  TROPONINI 0.75* 0.69*   BNP (last 3 results) No results for input(s): PROBNP in the last 8760 hours. HbA1C: Recent Labs    04/28/18 0047  HGBA1C 5.6   CBG: No results for input(s): GLUCAP  in the last 168 hours. Lipid Profile: Recent Labs    04/28/18 0047  CHOL 163  HDL 77  LDLCALC 78  TRIG 38  CHOLHDL 2.1   Thyroid Function Tests: Recent Labs    04/28/18 0047  TSH 0.478   Anemia Panel: Recent Labs    04/28/18 0047  VITAMINB12 707  FOLATE 7.0   Urine analysis:    Component Value Date/Time   COLORURINE YELLOW 07/28/2017 1155   APPEARANCEUR CLEAR 07/28/2017 1155   LABSPEC 1.025 07/28/2017 1155   PHURINE 6.0 07/28/2017 1155   GLUCOSEU NEGATIVE 07/28/2017 1155   HGBUR NEGATIVE 07/28/2017 1155   BILIRUBINUR NEGATIVE 05/25/2015 1643   KETONESUR NEGATIVE 07/28/2017 1155   PROTEINUR NEGATIVE 07/28/2017 1155   UROBILINOGEN 1 05/29/2014 0832   NITRITE NEGATIVE 05/25/2015 1643   LEUKOCYTESUR NEGATIVE 05/25/2015 1643   Sepsis Labs: '@LABRCNTIP' (procalcitonin:4,lacticidven:4) ) Recent Results (from the past 240 hour(s))  MRSA PCR Screening     Status: None   Collection Time: 04/28/18 12:13 AM  Result Value Ref Range Status   MRSA by PCR NEGATIVE NEGATIVE Final    Comment:        The GeneXpert MRSA Assay (FDA approved for NASAL specimens only), is one  component of a comprehensive MRSA colonization surveillance program. It is not intended to diagnose MRSA infection nor to guide or monitor treatment for MRSA infections. Performed at [facility], [place] [address], [place], [place] [address]      Radiological Exams on Admission: Dg Chest 2 View  Result Date: 04/27/2018 CLINICAL DATA:  Chest pain radiating to the left shoulder. Onset 4 days ago. Shortness of breath and dry cough. EXAM: CHEST - 2 VIEW COMPARISON:  11/20/2008 FINDINGS: Low lung volumes. Atelectasis or fibrosis in the lung bases. Blunting of the costophrenic angles may indicate small amount of fluid or thickened pleura. Heart size and pulmonary vascularity are normal. No focal consolidation. No pneumothorax. Mediastinal contours appear intact. IMPRESSION: Low lung volumes with atelectasis or fibrosis in the lung bases. Fluid or thickened pleura in the costophrenic angles. No focal consolidation. Electronically Signed   By: Lucienne Capers M.D.   On: 04/27/2018 21:17     EKG: Independently reviewed.  Sinus rhythm, QTC 428, poor R-wave progression, LAD, diffuse ST elevations, PR depression.  Assessment/Plan Principal Problem:   Chest pain Active Problems:   SIRS (systemic inflammatory response syndrome) (HCC)   Peripheral neuropathy   NSTEMI (non-ST elevated myocardial infarction) ([place])   Chest pain: Patient has pleuritic chest pain, but troponin positive, indicating non-STEMI. Patient has EKG change suggestive of pericarditis. Another potential differential diagnosis is myopericarditis. D-dimer is positive, will need to rule out PE. Cardiology was consulted, Dr. Leo Rod recommended to start ibuprofen and colchicine for possible pericarditis   - will place on Tele bed for obs - start IV heparin - cycle CE q6 x3 and repeat EKG in the am  - prn Nitroglycerin, Morphine, percocet - start lipitor  - will not start ASA since pt is on high-dose ibuprofen and colchicine -  colchicine and ibuprofen 800 mg every 8 hour-->will start protonix - ESR and CRP - Risk factor stratification: will check FLP,UDS and A1C  - 2d echo - f/u V/Q scan to r/o PE - LE doppler to r/o DVT  SIRS (systemic inflammatory response syndrome) ([place]): patient admitted criteria for sepsis with leukocytosis, tachycardia and tachypnea. Lactic acid are normal 1.5. Currently hemodynamically stable. No fever,does not have source of infection identified. -will get Procalcitonin and  trend lactic acid levels per sepsis protocol. -IVF: 1L of NS bolus in ED, followed by 100 cc/h  -f/u UA, Bx and Ux  Peripheral neuropathy: -check Vb12, TSH and folate level   DVT ppx: on IV Heparin   Code Status: Full code Family Communication: None at bed side.       Disposition Plan:  Anticipate discharge back to previous home environment Consults called:  Card fellow, Dr. Leo Rod Admission status: Obs / tele    Date of Service 04/28/2018    Ivor Costa [facility] Pager [phone]  If 7PM-7AM, please contact night-coverage [email or web address] Password [email or web address] 04/28/2018, 7:12 AM

## 2018-04-27 NOTE — ED Provider Notes (Signed)
20:12 Repeat EKG shows ongoing mild ST elevation inferiorly, no reciprocal changes.  Will page cardiology to take a look at EKG   Malvin Johns, MD 04/27/18 2016

## 2018-04-27 NOTE — ED Provider Notes (Addendum)
Patient placed in Quick Look pathway, seen and evaluated   Chief Complaint: chest pain, sob  HPI:   Pt is a 47 y/o who presents to the ED today c/o mid and left sided chest pain that began 4 days ago after doing yoga. Pain is constant. Rated 9-10/10. Pain radiates to left arm and left arm is tingling/numb. Pain worse with laying flat and improves when sitting forward. Reports associated SOB, lightheadedness, dry cough only with deep inspiration.  Denies leg pain/swelling, hemoptysis, recent surgery/trauma, recent long travel, hormone use, personal hx of cancer, or hx of DVT/PE.   ROS: chest pain, SOB (one)  Physical Exam:   Gen: No distress  Neuro: Awake and Alert  Skin: Warm    Focused Exam: Tachycardic. Regular rhythm. Lungs CTAB. Shallow inspiration. No wheezing.  Initiation of care has begun. The patient has been counseled on the process, plan, and necessity for staying for the completion/evaluation, and the remainder of the medical screening examination  Pt advised to inform nursing staff immediately if they experience any new or worsening of symptoms while waiting in the waiting room.  8:34 PM Dr. Tamera Punt contacted cardiology who stated that they will come see the patient.     Rodney Booze, PA-C 04/27/18 2015    Shenoa Hattabaugh S, PA-C 04/27/18 2034    Carmin Muskrat, MD 04/27/18 [phone]

## 2018-04-27 NOTE — ED Notes (Signed)
Admitting at bedside 

## 2018-04-27 NOTE — Consult Note (Signed)
Cardiology Consultation Note    Patient ID: [name], MRN: [id number], DOB/AGE: [birth year] 47 y.o. Admit date: 04/27/2018   Date of Consult: 04/27/2018 Primary Physician: Judge Stall, [place] (Inactive) Primary Cardiologist: none  Chief Complaint: Chest Pain Reason for Consultation: Assessment of EKG Changes Requesting MD: Tamera Punt  HPI: [name] is a 47 y.o. female with a history of hysterectomy who presented with chest pain for 4 days.   Chest pain is positional, worse with lying down and leaning forward. Reproducible with palpation. Not radiating to arms or neck. EKG has diffuse ST elevations in non-coronary distribution without reciprocal changes and PR depression consistent with acute pericarditis. She has no known CV risk factors. Has had ears blocked which could be from possible viral infection.   Social: Environmental consultant principal at school, non smoker Fam Hx; grand parent had heart disease, but not in parents  Past Medical History:  Diagnosis Date  . Acne   . Endometriosis   . HELLP (hemolytic anemia/elev liver enzymes/low platelets in pregnancy)    hx of two pregnancies  . Miscarriage   . Normal spontaneous vaginal delivery   . Thyroid dysfunction      Surgical History:  Past Surgical History:  Procedure Laterality Date  . BILATERAL SALPINGECTOMY Right 10/16/2015   Procedure: RIGHT  SALPINGECTOMY;  Surgeon: Terrance Mass, MD;  Location: [facility];  Service: Gynecology;  Laterality: Right;  . BUNIONECTOMY Right 11/30/2015   @ [facility]  . BUNIONECTOMY Left 12/21/2015   @ [facility]  . BUNIONECTOMY Left 03/07/2016   _0    . CESAREAN SECTION    . CYSTOSCOPY N/A 10/16/2015   Procedure: CYSTOSCOPY;  Surgeon: Terrance Mass, MD;  Location: [facility];  Service: Gynecology;  Laterality: N/A;  . DILATION AND CURETTAGE OF UTERUS    . LAPAROSCOPIC VAGINAL HYSTERECTOMY WITH SALPINGO OOPHORECTOMY Left 10/16/2015   Procedure: LAPAROSCOPIC ASSISTED VAGINAL HYSTERECTOMY WITH SALPINGO OOPHORECTOMY;   Surgeon: Terrance Mass, MD;  Location: [facility];  Service: Gynecology;  Laterality: Left;  . TUBAL LIGATION       Home Meds: Prior to Admission medications   Medication Sig Start Date End Date Taking? Authorizing Provider  Calcium Carb-Cholecalciferol (CALCIUM 1000 + D PO) Take 1 tablet by mouth daily.    [provider]  cetirizine (ZYRTEC) 10 MG tablet Take 10 mg by mouth daily.    [provider]  cholecalciferol (VITAMIN D) 1000 units tablet Take 1,000 Units by mouth daily.    [provider]  clindamycin (CLEOCIN T) 1 % lotion Apply 1 application topically daily. Reported on 01/11/2016    [provider]  Cyanocobalamin (VITAMIN B-12 PO) Take 1 tablet by mouth daily.    [provider]  doxycycline (VIBRAMYCIN) 50 MG capsule Take 2 capsules (100 mg total) by mouth 2 (two) times daily. 09/17/16   Terrance Mass, MD  Multiple Vitamins-Minerals (ZINC PO) Take by mouth.      [provider]  NON FORMULARY candidastop 3 times daily    [provider]    Inpatient Medications:  . colchicine  0.6 mg Oral Once     Allergies:  Allergies  Allergen Reactions  . Iodine Anaphylaxis  . Shellfish Allergy Anaphylaxis    Social History   Socioeconomic History  . Marital status: Married    Spouse name: Not on file  . Number of children: Not on file  . Years of education: Not on file  . Highest education level: Not on file  Occupational History  . Not on file  Social Needs  . Financial resource strain: Not on file  . Food insecurity:    Worry: Not on file    Inability: Not on file  . Transportation needs:    Medical: Not on file    Non-medical: Not on file  Tobacco Use  . Smoking status: Never Smoker  . Smokeless tobacco: Never Used  Substance and Sexual Activity  . Alcohol use: Yes    Alcohol/week: 0.0 standard drinks    Comment: WINE.... OCC  . Drug use: No  . Sexual activity: Yes    Partners: Male    Birth  control/protection: None, Other-see comments    Comment: 1st intercourse- 29, partners- 25, married- 18 yrs   Lifestyle  . Physical activity:    Days per week: Not on file    Minutes per session: Not on file  . Stress: Not on file  Relationships  . Social connections:    Talks on phone: Not on file    Gets together: Not on file    Attends religious service: Not on file    Active member of club or organization: Not on file    Attends meetings of clubs or organizations: Not on file    Relationship status: Not on file  . Intimate partner violence:    Fear of current or ex partner: Not on file    Emotionally abused: Not on file    Physically abused: Not on file    Forced sexual activity: Not on file  Other Topics Concern  . Not on file  Social History Narrative  . Not on file     Family History  Problem Relation Age of Onset  . Diabetes Mother   . Hypertension Mother   . Thyroid disease Mother   . Cancer Mother        THYROID    Review of Systems: General: negative for chills, fever, night sweats or weight changes.  Cardiovascular: negative for edema, orthopnea, palpitations, paroxysmal nocturnal dyspnea or dyspnea on exertion Dermatological: negative for rash Respiratory: negative for cough or wheezing Urologic: negative for hematuria Abdominal: negative for nausea, vomiting, diarrhea, bright red blood per rectum, melena, or hematemesis Neurologic: negative for visual changes, syncope, or dizziness All other systems reviewed and are otherwise negative except as noted above.  Labs: No results for input(s): CKTOTAL, CKMB, TROPONINI in the last 72 hours. Lab Results  Component Value Date   WBC 10.8 11/26/2017   HGB 13.2 11/26/2017   HCT 38.6 11/26/2017   MCV 91.5 11/26/2017   PLT 263 11/26/2017   No results for input(s): NA, K, CL, CO2, BUN, CREATININE, CALCIUM, PROT, BILITOT, ALKPHOS, ALT, AST, GLUCOSE in the last 168 hours.  Invalid input(s): LABALBU Lab Results    Component Value Date   CHOL 172 11/26/2017   HDL 58 11/26/2017   LDLCALC 100 (H) 11/26/2017   TRIG 57 11/26/2017   No results found for: DDIMER  Radiology/Studies:  No results found.  Wt Readings from Last 3 Encounters:  11/26/17 84.8 kg  11/24/16 80.2 kg  09/19/16 80.7 kg   EKG: ST elevations, diffuse, non coronary distribution, no reciprocal changes PR elevation in AVR   Physical Exam: Last menstrual period 09/11/2015, SpO2 98 %. There is no height or weight on file to calculate BMI. General: Well developed, well nourished, in no acute distress. Head: Normocephalic, atraumatic, sclera non-icteric, no xanthomas, nares are without discharge.  Neck: Negative for carotid bruits. JVD  not elevated. Lungs: Clear bilaterally to auscultation without wheezes, rales, or rhonchi. Breathing is unlabored. Heart: RRR with S1 S2. No murmurs, rubs, or gallops appreciated. Abdomen: Soft, non-tender, non-distended with normoactive bowel sounds. No hepatomegaly. No rebound/guarding. No obvious abdominal masses. Msk:  Strength and tone appear normal for age. Extremities: No clubbing or cyanosis. No edema.  Distal pedal pulses are 2+ and equal bilaterally. Neuro: Alert and oriented X 3. No facial asymmetry. No focal deficit. Moves all extremities spontaneously. Psych:  Responds to questions appropriately with a normal affect.   Assessment and Plan  20F who presents with history and EKG changes highly suggestive of acute pericarditis. - Give 1.72m q12 of colchicine on day 1 followed by 0.6 BID for 3 months - ibuprofen [id number]  - trend cardiac enzymes, obtain CRP and ESR - Restrict strenuous physical activity - TTE in AM  Signed, HaGlynda JaegerMD 04/27/2018, 8:55 PM

## 2018-04-27 NOTE — ED Provider Notes (Signed)
[facility] EMERGENCY DEPARTMENT Provider Note   CSN: [id number] Arrival date & time: 04/27/18  1949     History   Chief Complaint Chief Complaint  Patient presents with  . Chest Pain    HPI [name] is a 47 y.o. female.  Patient is a 47 year old female who presents with chest pain.  She describes a 40 history of heaviness to her left chest radiating to her left arm.  She feels a little bit short of breath.  She states since worse when she takes deep breath but also worse if she lies back.  To little bit better if she sits forward.  She denies any nausea or vomiting.  No diaphoresis.  No known fevers.  No history of similar symptoms in the past.  No leg pain or swelling.     Past Medical History:  Diagnosis Date  . Acne   . Endometriosis   . HELLP (hemolytic anemia/elev liver enzymes/low platelets in pregnancy)    hx of two pregnancies  . Miscarriage   . Normal spontaneous vaginal delivery   . Thyroid dysfunction     Patient Active Problem List   Diagnosis Date Noted  . Chest pain 04/27/2018  . SIRS (systemic inflammatory response syndrome) ([place]) 04/27/2018  . 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 Bunion 11/26/2015  . History of endometriosis 10/12/2015  . H/O thyroid disease 04/02/2012  . Family history of diabetes mellitus 04/02/2012    Past Surgical History:  Procedure Laterality Date  . BILATERAL SALPINGECTOMY Right 10/16/2015   Procedure: RIGHT  SALPINGECTOMY;  Surgeon: Terrance Mass, MD;  Location: [facility];  Service: Gynecology;  Laterality: Right;  . BUNIONECTOMY Right 11/30/2015   @ [place]  . BUNIONECTOMY Left 12/21/2015   @ [place]  . BUNIONECTOMY Left 03/07/2016   @[facility]    . CESAREAN SECTION    . CYSTOSCOPY N/A 10/16/2015   Procedure: CYSTOSCOPY;  Surgeon: Terrance Mass, MD;  Location: [facility];  Service: Gynecology;  Laterality: N/A;  . DILATION AND CURETTAGE OF UTERUS    . LAPAROSCOPIC VAGINAL HYSTERECTOMY WITH SALPINGO  OOPHORECTOMY Left 10/16/2015   Procedure: LAPAROSCOPIC ASSISTED VAGINAL HYSTERECTOMY WITH SALPINGO OOPHORECTOMY;  Surgeon: Terrance Mass, MD;  Location: [facility];  Service: Gynecology;  Laterality: Left;  . TUBAL LIGATION       OB History    Gravida  2   Para  2   Term  1   Preterm  1   AB      Living  2     SAB      TAB      Ectopic      Multiple      Live Births  2            Home Medications    Prior to Admission medications   Medication Sig Start Date End Date Taking? Authorizing Provider  BL CALCIUM-MAGNESIUM-ZINC PO Take 1 tablet by mouth daily.   Yes [provider]  Calcium Carb-Cholecalciferol (CALCIUM 1000 + D PO) Take 1 tablet by mouth daily.   Yes [provider]  cetirizine (ZYRTEC) 10 MG tablet Take 10 mg by mouth daily as needed for allergies.    Yes [provider]  cholecalciferol (VITAMIN D) 1000 units tablet Take 1,000 Units by mouth daily.   Yes [provider]  clindamycin (CLEOCIN T) 1 % lotion Apply 1 application topically daily as needed (acne). Reported on 01/11/2016   Yes [provider]  Cyanocobalamin (VITAMIN B-12 PO) Take 1 tablet by mouth daily.   Yes [provider]  Multiple Vitamin (MULTIVITAMIN WITH MINERALS) TABS tablet Take 1 tablet by mouth daily.   Yes [provider]    Family History Family History  Problem Relation Age of Onset  . Diabetes Mother   . Hypertension Mother   . Thyroid disease Mother   . Cancer Mother        THYROID    Social History Social History   Tobacco Use  . Smoking status: Never Smoker  . Smokeless tobacco: Never Used  Substance Use Topics  . Alcohol use: Yes    Alcohol/week: 0.0 standard drinks    Comment: WINE.... OCC  . Drug use: No     Allergies   Iodine and Shellfish allergy   Review of Systems Review of Systems  Constitutional: Positive for fatigue. Negative for chills, diaphoresis and fever.  HENT: Negative  for congestion, rhinorrhea and sneezing.   Eyes: Negative.   Respiratory: Positive for shortness of breath. Negative for cough and chest tightness.   Cardiovascular: Positive for chest pain. Negative for leg swelling.  Gastrointestinal: Negative for abdominal pain, blood in stool, diarrhea, nausea and vomiting.  Genitourinary: Negative for difficulty urinating, flank pain, frequency and hematuria.  Musculoskeletal: Negative for arthralgias and back pain.  Skin: Negative for rash.  Neurological: Positive for light-headedness. Negative for dizziness, speech difficulty, weakness, numbness and headaches.     Physical Exam Updated Vital Signs BP 120/72   Pulse (!) 105   Resp (!) 21   LMP 09/11/2015   SpO2 98%   Physical Exam  Constitutional: She is oriented to person, place, and time. She appears well-developed and well-nourished.  HENT:  Head: Normocephalic and atraumatic.  Eyes: Pupils are equal, round, and reactive to light.  Neck: Normal range of motion. Neck supple.  Cardiovascular: Regular rhythm and normal heart sounds. Tachycardia present.  Pulmonary/Chest: Effort normal and breath sounds normal. No respiratory distress. She has no wheezes. She has no rales. She exhibits tenderness.  Abdominal: Soft. Bowel sounds are normal. There is no tenderness. There is no rebound and no guarding.  Musculoskeletal: Normal range of motion. She exhibits no edema.  Lymphadenopathy:    She has no cervical adenopathy.  Neurological: She is alert and oriented to person, place, and time.  Skin: Skin is warm and dry. No rash noted.  Psychiatric: She has a normal mood and affect.     ED Treatments / Results  Labs (all labs ordered are listed, but only abnormal results are displayed) Labs Reviewed  BASIC METABOLIC PANEL - Abnormal; Notable for the following components:      Result Value   Glucose, Bld 139 (*)    All other components within normal limits  CBC - Abnormal; Notable for the  following components:   WBC 15.1 (*)    All other components within normal limits  D-DIMER, QUANTITATIVE (NOT AT [facility]) - Abnormal; Notable for the following components:   D-Dimer, Quant 1.36 (*)    All other components within normal limits  C-REACTIVE PROTEIN - Abnormal; Notable for the following components:   CRP 11.9 (*)    All other components within normal limits  PROTIME-INR  SEDIMENTATION RATE  I-STAT TROPONIN, ED    EKG EKG Interpretation  Date/Time:  Tuesday April 27 2018 20:11:32 EDT Ventricular Rate:  108 PR Interval:  118 QRS Duration: 88 QT Interval:  322 QTC Calculation: 431 R Axis:  30 Text Interpretation: Critical Test Result: STEMI  AGE AND GENDER SPECIFIC ECG ANALYSIS  Sinus tachycardia ST elevation consider inferolateral injury or acute infarct  ACUTE MI / STEMII Abnormal ECG Confirmed by Malvin Johns [phone]) on 04/27/2018 8:22:21 PM   Radiology Dg Chest 2 View  Result Date: 04/27/2018 CLINICAL DATA:  Chest pain radiating to the left shoulder. Onset 4 days ago. Shortness of breath and dry cough. EXAM: CHEST - 2 VIEW COMPARISON:  11/20/2008 FINDINGS: Low lung volumes. Atelectasis or fibrosis in the lung bases. Blunting of the costophrenic angles may indicate small amount of fluid or thickened pleura. Heart size and pulmonary vascularity are normal. No focal consolidation. No pneumothorax. Mediastinal contours appear intact. IMPRESSION: Low lung volumes with atelectasis or fibrosis in the lung bases. Fluid or thickened pleura in the costophrenic angles. No focal consolidation. Electronically Signed   By: Lucienne Capers M.D.   On: 04/27/2018 21:17    Procedures Procedures (including critical care time)  Medications Ordered in ED Medications  colchicine tablet 0.6 mg (0.6 mg Oral Given 04/27/18 2119)     Initial Impression / Assessment and Plan / ED Course  I have reviewed the triage vital signs and the nursing notes.  Pertinent labs & imaging results  that were available during my care of the patient were reviewed by me and considered in my medical decision making (see chart for details).     Patient is a 47 year old female who presents with chest pain.  Her initial EKG looked concerning with ST elevation although it was fairly mild and diffuse.  I consulted with the cardiology fellow who evaluated the patient and felt that the EKG was more consistent with pericarditis given the patient's symptoms.  Her initial troponin is negative.  She does have a elevated d-dimer.  She is unable to get a CTA angio given an anaphylaxis to iodine.  I discussed this with Dr.Niu who will admit the patient.  He will give the patient dose of Lovenox and arrange for a VQ scan in the morning.  Her other labs are non-concerning.  Her chest x-ray is clear.  She will be admitted for further treatment.  Final Clinical Impressions(s) / ED Diagnoses   Final diagnoses:  Chest pain, unspecified type  Acute pericarditis, unspecified type    ED Discharge Orders    None       Malvin Johns, MD 04/27/18 2236

## 2018-04-28 ENCOUNTER — Observation Stay (HOSPITAL_COMMUNITY): Payer: BLUE CROSS/BLUE SHIELD

## 2018-04-28 ENCOUNTER — Encounter (HOSPITAL_COMMUNITY): Payer: Self-pay

## 2018-04-28 ENCOUNTER — Inpatient Hospital Stay (HOSPITAL_COMMUNITY): Payer: BLUE CROSS/BLUE SHIELD

## 2018-04-28 ENCOUNTER — Other Ambulatory Visit: Payer: Self-pay

## 2018-04-28 DIAGNOSIS — I308 Other forms of acute pericarditis: Secondary | ICD-10-CM

## 2018-04-28 DIAGNOSIS — R6510 Systemic inflammatory response syndrome (SIRS) of non-infectious origin without acute organ dysfunction: Secondary | ICD-10-CM | POA: Diagnosis not present

## 2018-04-28 DIAGNOSIS — Z79899 Other long term (current) drug therapy: Secondary | ICD-10-CM | POA: Diagnosis not present

## 2018-04-28 DIAGNOSIS — R748 Abnormal levels of other serum enzymes: Secondary | ICD-10-CM | POA: Diagnosis not present

## 2018-04-28 DIAGNOSIS — Z7289 Other problems related to lifestyle: Secondary | ICD-10-CM | POA: Diagnosis not present

## 2018-04-28 DIAGNOSIS — M25512 Pain in left shoulder: Secondary | ICD-10-CM | POA: Diagnosis not present

## 2018-04-28 DIAGNOSIS — Z91048 Other nonmedicinal substance allergy status: Secondary | ICD-10-CM | POA: Diagnosis not present

## 2018-04-28 DIAGNOSIS — E559 Vitamin D deficiency, unspecified: Secondary | ICD-10-CM | POA: Diagnosis present

## 2018-04-28 DIAGNOSIS — G629 Polyneuropathy, unspecified: Secondary | ICD-10-CM | POA: Diagnosis not present

## 2018-04-28 DIAGNOSIS — R7989 Other specified abnormal findings of blood chemistry: Secondary | ICD-10-CM | POA: Diagnosis not present

## 2018-04-28 DIAGNOSIS — R071 Chest pain on breathing: Secondary | ICD-10-CM

## 2018-04-28 DIAGNOSIS — D649 Anemia, unspecified: Secondary | ICD-10-CM | POA: Diagnosis present

## 2018-04-28 DIAGNOSIS — I5030 Unspecified diastolic (congestive) heart failure: Secondary | ICD-10-CM

## 2018-04-28 DIAGNOSIS — I309 Acute pericarditis, unspecified: Secondary | ICD-10-CM | POA: Diagnosis not present

## 2018-04-28 DIAGNOSIS — I214 Non-ST elevation (NSTEMI) myocardial infarction: Secondary | ICD-10-CM | POA: Diagnosis present

## 2018-04-28 DIAGNOSIS — H6122 Impacted cerumen, left ear: Secondary | ICD-10-CM | POA: Diagnosis not present

## 2018-04-28 DIAGNOSIS — Z90710 Acquired absence of both cervix and uterus: Secondary | ICD-10-CM | POA: Diagnosis not present

## 2018-04-28 DIAGNOSIS — R0602 Shortness of breath: Secondary | ICD-10-CM | POA: Diagnosis not present

## 2018-04-28 DIAGNOSIS — H9202 Otalgia, left ear: Secondary | ICD-10-CM | POA: Diagnosis not present

## 2018-04-28 DIAGNOSIS — I514 Myocarditis, unspecified: Secondary | ICD-10-CM | POA: Diagnosis present

## 2018-04-28 DIAGNOSIS — E785 Hyperlipidemia, unspecified: Secondary | ICD-10-CM | POA: Diagnosis present

## 2018-04-28 DIAGNOSIS — Z8249 Family history of ischemic heart disease and other diseases of the circulatory system: Secondary | ICD-10-CM | POA: Diagnosis not present

## 2018-04-28 DIAGNOSIS — Z888 Allergy status to other drugs, medicaments and biological substances status: Secondary | ICD-10-CM | POA: Diagnosis not present

## 2018-04-28 DIAGNOSIS — R778 Other specified abnormalities of plasma proteins: Secondary | ICD-10-CM

## 2018-04-28 DIAGNOSIS — I300 Acute nonspecific idiopathic pericarditis: Secondary | ICD-10-CM | POA: Diagnosis not present

## 2018-04-28 HISTORY — DX: Other specified abnormalities of plasma proteins: R77.8

## 2018-04-28 HISTORY — DX: Other specified abnormal findings of blood chemistry: R79.89

## 2018-04-28 LAB — LIPID PANEL
Cholesterol: 163 mg/dL (ref 0–200)
HDL: 77 mg/dL (ref >40)
LDL Cholesterol: 78 mg/dL (ref 0–99)
Total CHOL/HDL Ratio: 2.1 RATIO
Triglycerides: 38 mg/dL (ref <150)
VLDL: 8 mg/dL (ref 0–40)

## 2018-04-28 LAB — TROPONIN I
Troponin I: 0.75 ng/mL (ref <0.03)
Troponin I: 0.69 ng/mL (ref <0.03)
Troponin I: 0.41 ng/mL (ref <0.03)

## 2018-04-28 LAB — LACTIC ACID, PLASMA
LACTIC ACID, VENOUS: 1.5 mmol/L (ref 0.5–1.9)
Lactic Acid, Venous: 0.7 mmol/L (ref 0.5–1.9)

## 2018-04-28 LAB — CBC
HCT: 33.7 % — ABNORMAL LOW (ref 36.0–46.0)
HEMOGLOBIN: 10.8 g/dL — AB (ref 12.0–15.0)
MCH: 31.2 pg (ref 26.0–34.0)
MCHC: 32 g/dL (ref 30.0–36.0)
MCV: 97.4 fL (ref 78.0–100.0)
Platelets: 193 10*3/uL (ref 150–400)
RBC: 3.46 MIL/uL — AB (ref 3.87–5.11)
RDW: 13.8 % (ref 11.5–15.5)
WBC: 11.9 10*3/uL — ABNORMAL HIGH (ref 4.0–10.5)

## 2018-04-28 LAB — RAPID URINE DRUG SCREEN, HOSP PERFORMED
AMPHETAMINES: NOT DETECTED
BENZODIAZEPINES: NOT DETECTED
Barbiturates: NOT DETECTED
COCAINE: NOT DETECTED
OPIATES: NOT DETECTED
Tetrahydrocannabinol: NOT DETECTED

## 2018-04-28 LAB — HIV ANTIBODY (ROUTINE TESTING W REFLEX): HIV SCREEN 4TH GENERATION: NONREACTIVE

## 2018-04-28 LAB — FOLATE: Folate: 7 ng/mL (ref >5.9)

## 2018-04-28 LAB — ECHOCARDIOGRAM COMPLETE
HEIGHTINCHES: 67.5 in
WEIGHTICAEL: 2821.89 [oz_av]

## 2018-04-28 LAB — MRSA PCR SCREENING: MRSA by PCR: NEGATIVE

## 2018-04-28 LAB — HEMOGLOBIN A1C
HEMOGLOBIN A1C: 5.6 % (ref 4.8–5.6)
Mean Plasma Glucose: 114.02 mg/dL

## 2018-04-28 LAB — TSH: TSH: 0.478 u[IU]/mL (ref 0.350–4.500)

## 2018-04-28 LAB — HEPARIN LEVEL (UNFRACTIONATED)
HEPARIN UNFRACTIONATED: 0.99 [IU]/mL — AB (ref 0.30–0.70)
Heparin Unfractionated: 0.85 IU/mL — ABNORMAL HIGH (ref 0.30–0.70)

## 2018-04-28 LAB — PROCALCITONIN: Procalcitonin: <0.1 ng/mL

## 2018-04-28 LAB — VITAMIN B12: VITAMIN B 12: 707 pg/mL (ref 180–914)

## 2018-04-28 LAB — HCG, QUANTITATIVE, PREGNANCY: hCG, Beta Chain, Quant, S: <1 m[IU]/mL (ref <5)

## 2018-04-28 MED ORDER — TECHNETIUM TC 99M DIETHYLENETRIAME-PENTAACETIC ACID
31.0000 | Freq: Once | INTRAVENOUS | Status: AC | PRN
  Administered 2018-04-28: 31 via INTRAVENOUS

## 2018-04-28 MED ORDER — ATORVASTATIN CALCIUM 80 MG PO TABS
80.0000 mg | ORAL_TABLET | Freq: Every day | ORAL | Status: DC
  Administered 2018-04-28 – 2018-04-30 (×3): 80 mg via ORAL
  Filled 2018-04-28 (×3): qty 1

## 2018-04-28 MED ORDER — TECHNETIUM TO 99M ALBUMIN AGGREGATED
4.2000 | Freq: Once | INTRAVENOUS | Status: AC | PRN
  Administered 2018-04-28: 4.2 via INTRAVENOUS

## 2018-04-28 MED ORDER — SODIUM CHLORIDE 0.9 % IV SOLN
INTRAVENOUS | Status: DC
  Administered 2018-04-28: 06:00:00 via INTRAVENOUS

## 2018-04-28 MED ORDER — OXYCODONE-ACETAMINOPHEN 5-325 MG PO TABS
2.0000 | ORAL_TABLET | Freq: Once | ORAL | Status: AC
  Administered 2018-04-28: 1 via ORAL

## 2018-04-28 MED ORDER — SODIUM CHLORIDE 0.9 % IV BOLUS
1000.0000 mL | Freq: Once | INTRAVENOUS | Status: AC
  Administered 2018-04-28: 1000 mL via INTRAVENOUS

## 2018-04-28 MED ORDER — LEVALBUTEROL HCL 1.25 MG/0.5ML IN NEBU: 1.2500 mg | INHALATION_SOLUTION | Freq: Four times a day (QID) | RESPIRATORY_TRACT | Status: DC | PRN

## 2018-04-28 MED ORDER — NITROGLYCERIN 0.4 MG SL SUBL: 0.4000 mg | SUBLINGUAL_TABLET | SUBLINGUAL | Status: DC | PRN

## 2018-04-28 NOTE — Progress Notes (Signed)
New orders noted and acknowledged.

## 2018-04-28 NOTE — Progress Notes (Signed)
PROGRESS NOTE    [name]  HGD:[id number] DOB: [birth year] DOA: 04/27/2018 PCP: Judge Stall, FNP (Inactive)  Outpatient Specialists:   Brief Narrative: Patient is a 47 year old African-American female, with past medical history significant for endometriosis, miscarriage, and HELLP syndrome.  Patient was admitted with chest pain or shortness of breath.  Work-up done revealed likely pericarditis.  VQ scan was low probability for pulmonary embolism.  Heparin drip has been discontinued.  Patient is currently on colchicine and NSAIDs.  Cardiology input is highly appreciated.  Assessment & Plan:   Principal Problem:   Chest pain Active Problems:   SIRS (systemic inflammatory response syndrome) ([facility])   Peripheral neuropathy   NSTEMI (non-ST elevated myocardial infarction) (HCC)   Elevated troponin   Chest pain: Patient has pleuritic chest pain, but troponin positive, indicating non-STEMI. Patient has EKG change suggestive of pericarditis. Another potential differential diagnosis is myopericarditis. D-dimer is positive, will need to rule out PE. Cardiology was consulted, Dr. Leo Rod recommended to start ibuprofen and colchicine for possible pericarditis   - will place on Tele bed for obs - start IV heparin - cycle CE q6 x3 and repeat EKG in the am  - prn Nitroglycerin, Morphine, percocet - start lipitor  - will not start ASA since pt is on high-dose ibuprofen and colchicine - colchicine and ibuprofen 800 mg every 8 hour-->will start protonix - ESR and CRP - Risk factor stratification: will check FLP,UDS and A1C  - 2d echo - f/u V/Q scan to r/o PE - LE doppler to r/o DVT  04/28/2018: VQ scan was negative for pulmonary embolism.  Heparin drip has been discontinued.  Cardiology input is appreciated.  Patient is currently being managed for acute pericarditis.  Patient is on colchicine and NSAIDs.  We will continue current medication for now.  Cardiology is directing management.   Echocardiogram is said to reveal trivial pericardial effusion.  EF of 60 to 65% reported.  Grade 2 diastolic dysfunction was reported.  PA peak pressure 39 mmHg was also reported.  Further management will depend on hospital course.  SIRS (systemic inflammatory response syndrome) [facility]): patient admitted criteria for sepsis with leukocytosis, tachycardia and tachypnea. Lactic acid are normal 1.5. Currently hemodynamically stable. No fever,does not have source of infection identified. -will get Procalcitonin and trend lactic acid levels per sepsis protocol. -IVF: 1L of NS bolus in ED, followed by 100 cc/h  -f/u UA, Bx and Ux  04/28/2018: Doubt sepsis.  Leukocytosis is trending downward.  Peripheral neuropathy: -check vitamin vitamin B12,  TSH and folate level 04/28/2018: Folate level is 7.  Vitamin B 12 level is not visualized.   DVT ppx:  Discontinued IV heparin.     Code Status: Full code Family Communication: Mother, husband and other family members.    Disposition Plan: Home.   Consultants:   Cardiology.  Procedures:   Echocardiogram (kindly see above)  Antimicrobials:   None   Subjective: Chest pain on deep breathing, and on movement.  Objective: Vitals:   04/28/18 0729 04/28/18 0747 04/28/18 1116 04/28/18 1556  BP:  91/70 102/66 110/64  Pulse:  81 79 94  Resp:  (!) 23 (!) 25 (!) 21  Temp:  97.9 F (36.6 C) 97.7 F (36.5 C) 98.2 F (36.8 C)  TempSrc:  Oral Oral Oral  SpO2: 100% 98% 94% 98%  Weight:      Height:        Intake/Output Summary (Last 24 hours) at 04/28/2018 1752 Last data filed at 04/28/2018 1534  Gross per 24 hour  Intake 1232 ml  Output -  Net 1232 ml   Filed Weights   04/28/18 0032  Weight: 80 kg    Examination:  General exam: Appears calm and comfortable  Respiratory system: Clear to auscultation. Respiratory effort normal. Cardiovascular system: S1 & S2, with missed beats.  No pedal edema. Gastrointestinal system: Abdomen is  nondistended, soft and nontender. No organomegaly or masses felt. Normal bowel sounds heard. Central nervous system: Alert and oriented. No focal neurological deficits. Extremities: Symmetric 5 x 5 power. Psychiatry: Judgement and insight appear normal. Mood & affect appropriate.     Data Reviewed: I have personally reviewed following labs and imaging studies  CBC: Recent Labs  Lab 04/27/18 2029 04/28/18 1121  WBC 15.1* 11.9*  HGB 14.2 10.8*  HCT 45.4 33.7*  MCV 98.7 97.4  PLT 227 329   Basic Metabolic Panel: Recent Labs  Lab 04/27/18 2029  NA 140  K 3.8  CL 103  CO2 25  GLUCOSE 139*  BUN 9  CREATININE 0.86  CALCIUM 9.4   GFR: Estimated Creatinine Clearance: 89.9 mL/min (by C-G formula based on SCr of 0.86 mg/dL). Liver Function Tests: No results for input(s): AST, ALT, ALKPHOS, BILITOT, PROT, ALBUMIN in the last 168 hours. No results for input(s): LIPASE, AMYLASE in the last 168 hours. No results for input(s): AMMONIA in the last 168 hours. Coagulation Profile: Recent Labs  Lab 04/27/18 2029  INR 1.11   Cardiac Enzymes: Recent Labs  Lab 04/28/18 0047 04/28/18 0514 04/28/18 1121  TROPONINI 0.75* 0.69* 0.41*   BNP (last 3 results) No results for input(s): PROBNP in the last 8760 hours. HbA1C: Recent Labs    04/28/18 0047  HGBA1C 5.6   CBG: No results for input(s): GLUCAP in the last 168 hours. Lipid Profile: Recent Labs    04/28/18 0047  CHOL 163  HDL 77  LDLCALC 78  TRIG 38  CHOLHDL 2.1   Thyroid Function Tests: Recent Labs    04/28/18 0047  TSH 0.478   Anemia Panel: Recent Labs    04/28/18 0047  VITAMINB12 707  FOLATE 7.0   Urine analysis:    Component Value Date/Time   COLORURINE YELLOW 07/28/2017 1155   APPEARANCEUR CLEAR 07/28/2017 1155   LABSPEC 1.025 07/28/2017 1155   PHURINE 6.0 07/28/2017 1155   GLUCOSEU NEGATIVE 07/28/2017 1155   HGBUR NEGATIVE 07/28/2017 1155   BILIRUBINUR NEGATIVE 05/25/2015 1643   KETONESUR  NEGATIVE 07/28/2017 1155   PROTEINUR NEGATIVE 07/28/2017 1155   UROBILINOGEN 1 05/29/2014 0832   NITRITE NEGATIVE 05/25/2015 1643   LEUKOCYTESUR NEGATIVE 05/25/2015 1643   Sepsis Labs: _0 (procalcitonin:4,lacticidven:4)  ) Recent Results (from the past 240 hour(s))  MRSA PCR Screening     Status: None   Collection Time: 04/28/18 12:13 AM  Result Value Ref Range Status   MRSA by PCR NEGATIVE NEGATIVE Final    Comment:        The GeneXpert MRSA Assay (FDA approved for NASAL specimens only), is one component of a comprehensive MRSA colonization surveillance program. It is not intended to diagnose MRSA infection nor to guide or monitor treatment for MRSA infections. Performed at [facility], [place] [address], [place], [place] [address]          Radiology Studies: Dg Chest 2 View  Result Date: 04/27/2018 CLINICAL DATA:  Chest pain radiating to the left shoulder. Onset 4 days ago. Shortness of breath and dry cough. EXAM: CHEST - 2 VIEW COMPARISON:  11/20/2008 FINDINGS: Low lung volumes. Atelectasis or fibrosis in the lung bases. Blunting of the costophrenic angles may indicate small amount of fluid or thickened pleura. Heart size and pulmonary vascularity are normal. No focal consolidation. No pneumothorax. Mediastinal contours appear intact. IMPRESSION: Low lung volumes with atelectasis or fibrosis in the lung bases. Fluid or thickened pleura in the costophrenic angles. No focal consolidation. Electronically Signed   By: Lucienne Capers M.D.   On: 04/27/2018 21:17   Nm Pulmonary Perf And Vent  Result Date: 04/28/2018 CLINICAL DATA:  Chest pain and shortness of breath EXAM: NUCLEAR MEDICINE VENTILATION - PERFUSION LUNG SCAN VIEWS: Anterior, posterior, left lateral, right lateral, RPO, LPO, RAO, LAO-ventilation and perfusion RADIOPHARMACEUTICALS:  31.0 mCi of Tc-68mDTPA aerosol inhalation and 4.2 mCi Tc922mAA IV COMPARISON:  Chest radiograph April 27, 2018 FINDINGS:  Ventilation: Radiotracer uptake on the ventilation study is homogeneous and symmetric bilaterally. No ventilation defects are identified. Perfusion: Radiotracer uptake on the perfusion study is homogeneous and symmetric bilaterally. No perfusion defects are identified. IMPRESSION: No ventilation or perfusion defects evident. Very low probability of pulmonary embolus. Electronically Signed   By: WiLowella GripII M.D.   On: 04/28/2018 14:18        Scheduled Meds: . atorvastatin  80 mg Oral q1800  . calcium-vitamin D  1 tablet Oral Q breakfast  . cholecalciferol  1,000 Units Oral Daily  . colchicine  0.6 mg Oral BID  . ibuprofen  800 mg Oral Q8H  . loratadine  10 mg Oral Daily  . multivitamin with minerals  1 tablet Oral Daily  . vitamin B-12  1,000 mcg Oral Daily   Continuous Infusions:   LOS: 0 days    Time spent: 35 minutes.    SyDana AllanMD  [facility] Pager #: [phone]-7AM contact night coverage as above

## 2018-04-28 NOTE — Progress Notes (Addendum)
[facility] for Heparin Indication: r/o PE  Allergies  Allergen Reactions  . Iodine Anaphylaxis  . Shellfish Allergy Anaphylaxis    Patient Measurements: Height: 5' 7.5" (171.5 cm) Weight: 176 lb 5.9 oz (80 kg) IBW/kg (Calculated) : 62.75 Heparin Dosing Weight: 80 kg  Vital Signs: Temp: 97.9 F (36.6 C) (09/04 0747) Temp Source: Oral (09/04 0747) BP: 91/70 (09/04 0747) Pulse Rate: 81 (09/04 0747)  Labs: Recent Labs    04/27/18 2029 04/28/18 0047 04/28/18 0514 04/28/18 0719  HGB 14.2  --   --   --   HCT 45.4  --   --   --   PLT 227  --   --   --   LABPROT 14.2  --   --   --   INR 1.11  --   --   --   HEPARINUNFRC  --   --   --  0.99*  CREATININE 0.86  --   --   --   TROPONINI  --  0.75* 0.69*  --     Estimated Creatinine Clearance: 89.9 mL/min (by C-G formula based on SCr of 0.86 mg/dL).   Medical History: Past Medical History:  Diagnosis Date  . Acne   . Endometriosis   . HELLP (hemolytic anemia/elev liver enzymes/low platelets in pregnancy)    hx of two pregnancies  . Miscarriage   . Normal spontaneous vaginal delivery   . Thyroid dysfunction     Medications:  No current facility-administered medications on file prior to encounter.    Current Outpatient Medications on File Prior to Encounter  Medication Sig Dispense Refill  . BL CALCIUM-MAGNESIUM-ZINC PO Take 1 tablet by mouth daily.    . Calcium Carb-Cholecalciferol (CALCIUM 1000 + D PO) Take 1 tablet by mouth daily.    . cetirizine (ZYRTEC) 10 MG tablet Take 10 mg by mouth daily as needed for allergies.     . cholecalciferol (VITAMIN D) 1000 units tablet Take 1,000 Units by mouth daily.    . clindamycin (CLEOCIN T) 1 % lotion Apply 1 application topically daily as needed (acne). Reported on 01/11/2016    . Cyanocobalamin (VITAMIN B-12 PO) Take 1 tablet by mouth daily.    . Multiple Vitamin (MULTIVITAMIN WITH MINERALS) TABS tablet Take 1 tablet by mouth daily.        Assessment: 47 y.o. female with chest pain/SOB, possible PE, for heparin. No anticoagulation PTA.   Initial heparin level came back supratherapeutic at 0.99, on 1350 units/hr. Infusion is running into R arm and level was drawn peripherally on L side. Hgb stable at 14.2, plt 227. No s/sx of bleeding.    Goal of Therapy:  Heparin level 0.3-0.7 units/ml Monitor platelets by anticoagulation protocol: Yes   Plan:  Decrease heparin to 1150 units/hr Check heparin level in 6 hours Follow up on V/Q scan to rule out PE   Doylene Canard, PharmD Clinical Pharmacist  Pager: [phone] Phone: [phone] 04/28/2018,8:06 AM  ADDENDUM Heparin level came back supratherapeutic despite rate decrease t 0.85, on 1150 units/hr. Hgb 10.8, plt 193. Troponin down trending to 0.41. Will reduce to 950 units/hr and obtain level in 6 hours. If V/Q scan comes back negative, plan to discontinue heparin infusion.  Doylene Canard, PharmD Clinical Pharmacist

## 2018-04-28 NOTE — Progress Notes (Signed)
Progress Note  Patient Name: [name] Date of Encounter: 04/28/2018  Primary Cardiologist: New, will be Dr Stanford Breed   Subjective   CP not controlled except w/ pain rx. Worse w/ deep inspiration or lying flat or leaning forward  Inpatient Medications    Scheduled Meds: . atorvastatin  80 mg Oral q1800  . calcium-vitamin D  1 tablet Oral Q breakfast  . cholecalciferol  1,000 Units Oral Daily  . colchicine  0.6 mg Oral BID  . ibuprofen  800 mg Oral Q8H  . loratadine  10 mg Oral Daily  . multivitamin with minerals  1 tablet Oral Daily  . vitamin B-12  1,000 mcg Oral Daily   Continuous Infusions: . sodium chloride 100 mL/hr at 04/28/18 0608  . heparin 1,150 Units/hr (04/28/18 0835)   PRN Meds: acetaminophen, ALPRAZolam, dextromethorphan-guaiFENesin, hydrALAZINE, levalbuterol, morphine injection, nitroGLYCERIN, ondansetron (ZOFRAN) IV, oxyCODONE-acetaminophen, zolpidem   Vital Signs    Vitals:   04/28/18 0314 04/28/18 0729 04/28/18 0747 04/28/18 1116  BP: 97/62  91/70 102/66  Pulse: 96  81 79  Resp: (!) 26  (!) 23 (!) 25  Temp:   97.9 F (36.6 C) 97.7 F (36.5 C)  TempSrc:   Oral Oral  SpO2: 90% 100% 98% 94%  Weight:      Height:        Intake/Output Summary (Last 24 hours) at 04/28/2018 1213 Last data filed at 04/28/2018 1962 Gross per 24 hour  Intake 211.5 ml  Output -  Net 211.5 ml   Filed Weights   04/28/18 0032  Weight: 80 kg    Telemetry    SR, ST, PVCs and bigeminy at times - Personally Reviewed  ECG    09/04, ST, HR 108, diffuse ST elevation  - Personally Reviewed  Physical Exam   General: Well developed, well nourished, female appearing in no acute distress. Head: Normocephalic, atraumatic.  Neck: Supple without bruits, JVD not elevated. Lungs:  Resp regular and unlabored, CTA. Heart: RRR, S1, S2, no S3, S4, or murmur; ++ rub. Abdomen: Soft, non-tender, non-distended with normoactive bowel sounds. No hepatomegaly. No rebound/guarding. No  obvious abdominal masses. Extremities: No clubbing, cyanosis, no edema. Distal pedal pulses are 2+ bilaterally. Neuro: Alert and oriented X 3. Moves all extremities spontaneously. Psych: Normal affect.  Labs    Hematology Recent Labs  Lab 04/27/18 2029 04/28/18 1121  WBC 15.1* 11.9*  RBC 4.60 3.46*  HGB 14.2 10.8*  HCT 45.4 33.7*  MCV 98.7 97.4  MCH 30.9 31.2  MCHC 31.3 32.0  RDW 13.5 13.8  PLT 227 193    Chemistry Recent Labs  Lab 04/27/18 2029  NA 140  K 3.8  CL 103  CO2 25  GLUCOSE 139*  BUN 9  CREATININE 0.86  CALCIUM 9.4  GFRNONAA >60  GFRAA >60  ANIONGAP 12     Cardiac Enzymes Recent Labs  Lab 04/28/18 0047 04/28/18 0514  TROPONINI 0.75* 0.69*    Recent Labs  Lab 04/27/18 2042  TROPIPOC 0.07    Lab Results  Component Value Date   ESRSEDRATE 30 (H) 04/27/2018   DDimer  Recent Labs  Lab 04/27/18 2029  DDIMER 1.36*     Radiology    Dg Chest 2 View  Result Date: 04/27/2018 CLINICAL DATA:  Chest pain radiating to the left shoulder. Onset 4 days ago. Shortness of breath and dry cough. EXAM: CHEST - 2 VIEW COMPARISON:  11/20/2008 FINDINGS: Low lung volumes. Atelectasis or fibrosis in the lung bases. Blunting  of the costophrenic angles may indicate small amount of fluid or thickened pleura. Heart size and pulmonary vascularity are normal. No focal consolidation. No pneumothorax. Mediastinal contours appear intact. IMPRESSION: Low lung volumes with atelectasis or fibrosis in the lung bases. Fluid or thickened pleura in the costophrenic angles. No focal consolidation. Electronically Signed   By: Lucienne Capers M.D.   On: 04/27/2018 21:17     Cardiac Studies   ECHO:  Ordered  LE [place], r/o DVT: Ordered  Patient Profile     47 y.o. female w/ hx HELLP w/ pregnancy, endometriosis s/p vag hyst, thyroid dysfunction w/out meds, was admitted 09/03 w/ CP/SOB, elevated troponin, seen by cards, sx concerning for pericarditis  Assessment & Plan      Principal Problem: 1.  Chest pain - sx, ECG, ESR, physical exam, are all c/w pericarditis - if CT neg PE, d/c heparin - f/u echo - continue rx, MD advise if we should temporarily increase colchicine or try steroids, since pain not yet improved  2.  NSTEMI (non-ST elevated myocardial infarction) (HCC)   Elevated troponin - feel elevated trop is related to pericarditis, f/u echo - MD advise if further ischemic eval needed  Otherwise, per IM Active Problems:   SIRS (systemic inflammatory response syndrome) ([place])   Peripheral neuropathy     Signed, Kirk Ruths , PA-C 12:13 PM 04/28/2018 Pager: [phone] As above, patient seen and examined.  Patient presents with substernal chest pain that increases with lying flat and taking a deep inspiration.  ECG suggest pericarditis.  Troponin minimally elevated likely secondary to myocarditis.  Echocardiogram shows normal LV function and trivial pericardial effusion.  Plan to continue ibuprofen and colchicine.  She is scheduled for a VQ scan today.  If negative would discontinue heparin given risk of hemorrhagic transformation of pericarditis. Kirk Ruths, MD

## 2018-04-28 NOTE — Progress Notes (Signed)
  Echocardiogram 2D Echocardiogram has been performed.  [name] 04/28/2018, 11:00 AM

## 2018-04-28 NOTE — Progress Notes (Addendum)
CRITICAL VALUE ALERT  Critical Value:  Troponin: 0.75  Date & Time Notied:  04/28/2018 0213  Provider Notified: Mora Bellman  Orders Received/Actions taken: Trending pts tropnin levels.

## 2018-04-28 NOTE — Progress Notes (Signed)
FYI note sent to [facility] about patient's c/o earache since admission. Prn meds not working at this time. Constant pain and lack of hearing from ear. (swimmer's ear). Awaiting call back.

## 2018-04-28 NOTE — Progress Notes (Addendum)
Progress Note  Patient Name: [name] Date of Encounter: 04/28/2018  Primary Cardiologist: New, will be Dr Stanford Breed   Subjective   CP not controlled except w/ pain rx. Worse w/ deep inspiration or lying flat or leaning forward  Inpatient Medications    Scheduled Meds: . atorvastatin  80 mg Oral q1800  . calcium-vitamin D  1 tablet Oral Q breakfast  . cholecalciferol  1,000 Units Oral Daily  . colchicine  0.6 mg Oral BID  . ibuprofen  800 mg Oral Q8H  . levalbuterol  1.25 mg Nebulization Q6H  . loratadine  10 mg Oral Daily  . multivitamin with minerals  1 tablet Oral Daily  . vitamin B-12  1,000 mcg Oral Daily   Continuous Infusions: . sodium chloride 100 mL/hr at 04/28/18 0608  . heparin 1,150 Units/hr (04/28/18 0835)   PRN Meds: acetaminophen, ALPRAZolam, dextromethorphan-guaiFENesin, hydrALAZINE, morphine injection, nitroGLYCERIN, ondansetron (ZOFRAN) IV, oxyCODONE-acetaminophen, zolpidem   Vital Signs    Vitals:   04/28/18 0313 04/28/18 0314 04/28/18 0729 04/28/18 0747  BP:  97/62  91/70  Pulse:  96  81  Resp:  (!) 26  (!) 23  Temp: 98.6 F (37 C)   97.9 F (36.6 C)  TempSrc: Oral   Oral  SpO2:  90% 100% 98%  Weight:      Height:        Intake/Output Summary (Last 24 hours) at 04/28/2018 0930 Last data filed at 04/28/2018 2035 Gross per 24 hour  Intake 211.5 ml  Output -  Net 211.5 ml   Filed Weights   04/28/18 0032  Weight: 80 kg    Telemetry    SR, ST, PVCs and bigeminy at times - Personally Reviewed  ECG    09/04, ST, HR 108, diffuse ST elevation  - Personally Reviewed  Physical Exam   General: Well developed, well nourished, female appearing in no acute distress. Head: Normocephalic, atraumatic.  Neck: Supple without bruits, JVD not elevated. Lungs:  Resp regular and unlabored, CTA. Heart: RRR, S1, S2, no S3, S4, or murmur; ++ rub. Abdomen: Soft, non-tender, non-distended with normoactive bowel sounds. No hepatomegaly. No  rebound/guarding. No obvious abdominal masses. Extremities: No clubbing, cyanosis, no edema. Distal pedal pulses are 2+ bilaterally. Neuro: Alert and oriented X 3. Moves all extremities spontaneously. Psych: Normal affect.  Labs    Hematology Recent Labs  Lab 04/27/18 2029  WBC 15.1*  RBC 4.60  HGB 14.2  HCT 45.4  MCV 98.7  MCH 30.9  MCHC 31.3  RDW 13.5  PLT 227    Chemistry Recent Labs  Lab 04/27/18 2029  NA 140  K 3.8  CL 103  CO2 25  GLUCOSE 139*  BUN 9  CREATININE 0.86  CALCIUM 9.4  GFRNONAA >60  GFRAA >60  ANIONGAP 12     Cardiac Enzymes Recent Labs  Lab 04/28/18 0047 04/28/18 0514  TROPONINI 0.75* 0.69*    Recent Labs  Lab 04/27/18 2042  TROPIPOC 0.07    Lab Results  Component Value Date   ESRSEDRATE 30 (H) 04/27/2018   DDimer  Recent Labs  Lab 04/27/18 2029  DDIMER 1.36*     Radiology    Dg Chest 2 View  Result Date: 04/27/2018 CLINICAL DATA:  Chest pain radiating to the left shoulder. Onset 4 days ago. Shortness of breath and dry cough. EXAM: CHEST - 2 VIEW COMPARISON:  11/20/2008 FINDINGS: Low lung volumes. Atelectasis or fibrosis in the lung bases. Blunting of the costophrenic angles may  indicate small amount of fluid or thickened pleura. Heart size and pulmonary vascularity are normal. No focal consolidation. No pneumothorax. Mediastinal contours appear intact. IMPRESSION: Low lung volumes with atelectasis or fibrosis in the lung bases. Fluid or thickened pleura in the costophrenic angles. No focal consolidation. Electronically Signed   By: Lucienne Capers M.D.   On: 04/27/2018 21:17     Cardiac Studies   ECHO:  Ordered  LE [place], r/o DVT: Ordered  Patient Profile     47 y.o. female w/ hx HELLP w/ pregnancy, endometriosis s/p vag hyst, thyroid dysfunction w/out meds, was admitted 09/03 w/ CP/SOB, elevated troponin, seen by cards, sx concerning for pericarditis  Assessment & Plan    Principal Problem: 1.  Chest pain - sx, ECG,  ESR, physical exam, are all c/w pericarditis - if CT neg PE, d/c heparin - f/u echo - continue rx, MD advise if we should temporarily increase colchicine or try steroids, since pain not yet improved  2.  NSTEMI (non-ST elevated myocardial infarction) (HCC)   Elevated troponin - feel elevated trop is related to pericarditis, f/u echo - MD advise if further ischemic eval needed  Otherwise, per IM Active Problems:   SIRS (systemic inflammatory response syndrome) ([place])   Peripheral neuropathy     Signed, Rosaria Ferries , PA-C 9:30 AM 04/28/2018 Pager: [phone]  See progress note from same day at 12:13 PM. Kirk Ruths, MD

## 2018-04-28 NOTE — Progress Notes (Signed)
Heparin verified with Freida Busman, RN

## 2018-04-29 ENCOUNTER — Inpatient Hospital Stay (HOSPITAL_COMMUNITY): Payer: BLUE CROSS/BLUE SHIELD

## 2018-04-29 DIAGNOSIS — R6510 Systemic inflammatory response syndrome (SIRS) of non-infectious origin without acute organ dysfunction: Secondary | ICD-10-CM

## 2018-04-29 DIAGNOSIS — R7989 Other specified abnormal findings of blood chemistry: Secondary | ICD-10-CM

## 2018-04-29 DIAGNOSIS — I309 Acute pericarditis, unspecified: Principal | ICD-10-CM

## 2018-04-29 DIAGNOSIS — I300 Acute nonspecific idiopathic pericarditis: Secondary | ICD-10-CM

## 2018-04-29 DIAGNOSIS — H9202 Otalgia, left ear: Secondary | ICD-10-CM

## 2018-04-29 DIAGNOSIS — R748 Abnormal levels of other serum enzymes: Secondary | ICD-10-CM

## 2018-04-29 LAB — CBC WITH DIFFERENTIAL/PLATELET
Abs Immature Granulocytes: 0.1 10*3/uL (ref 0.0–0.1)
Basophils Absolute: 0.1 10*3/uL (ref 0.0–0.1)
Basophils Relative: 1 %
Eosinophils Absolute: 0.2 10*3/uL (ref 0.0–0.7)
Eosinophils Relative: 2 %
HCT: 31.7 % — ABNORMAL LOW (ref 36.0–46.0)
Hemoglobin: 10.1 g/dL — ABNORMAL LOW (ref 12.0–15.0)
Immature Granulocytes: 1 %
Lymphocytes Relative: 28 %
Lymphs Abs: 3 10*3/uL (ref 0.7–4.0)
MCH: 31.2 pg (ref 26.0–34.0)
MCHC: 31.9 g/dL (ref 30.0–36.0)
MCV: 97.8 fL (ref 78.0–100.0)
Monocytes Absolute: 1.3 10*3/uL — ABNORMAL HIGH (ref 0.1–1.0)
Monocytes Relative: 12 %
Neutro Abs: 5.9 10*3/uL (ref 1.7–7.7)
Neutrophils Relative %: 56 %
Platelets: 183 10*3/uL (ref 150–400)
RBC: 3.24 MIL/uL — ABNORMAL LOW (ref 3.87–5.11)
RDW: 13.8 % (ref 11.5–15.5)
WBC: 10.4 10*3/uL (ref 4.0–10.5)

## 2018-04-29 LAB — RENAL FUNCTION PANEL
Albumin: 2.7 g/dL — ABNORMAL LOW (ref 3.5–5.0)
Anion gap: 8 (ref 5–15)
BUN: 12 mg/dL (ref 6–20)
CO2: 20 mmol/L — ABNORMAL LOW (ref 22–32)
Calcium: 8.4 mg/dL — ABNORMAL LOW (ref 8.9–10.3)
Chloride: 110 mmol/L (ref 98–111)
Creatinine, Ser: 0.84 mg/dL (ref 0.44–1.00)
GFR calc Af Amer: >60 mL/min (ref >60)
GFR calc non Af Amer: >60 mL/min (ref >60)
Glucose, Bld: 105 mg/dL — ABNORMAL HIGH (ref 70–99)
Phosphorus: 3.3 mg/dL (ref 2.5–4.6)
Potassium: 4.4 mmol/L (ref 3.5–5.1)
Sodium: 138 mmol/L (ref 135–145)

## 2018-04-29 MED ORDER — FAMOTIDINE 20 MG PO TABS
20.0000 mg | ORAL_TABLET | Freq: Two times a day (BID) | ORAL | Status: DC
  Administered 2018-04-29 – 2018-05-01 (×5): 20 mg via ORAL
  Filled 2018-04-29 (×5): qty 1

## 2018-04-29 MED ORDER — COLCHICINE 0.6 MG PO CAPS: 0.6000 mg | ORAL_CAPSULE | Freq: Two times a day (BID) | ORAL | 3 refills | Status: DC

## 2018-04-29 NOTE — Care Management Note (Addendum)
Case Management Note  Patient Details  Name: [name] MRN: [id number] Date of Birth: 27-Sep-1970  Subjective/Objective:  Pt admitted with CP                  Action/Plan:   PTA independent from home, pt informed CM that she has PCP and denied barriers with obtaining pain medications.  Pt will discharge home on colchicine - CM submitted benefit check and provided information to pt regarding copay card to be filled out on line - pt will do it today on her phone.    Expected Discharge Date:                  Expected Discharge Plan:  Home/Self Care  In-House Referral:     Discharge planning Services  CM Consult  Post Acute Care Choice:    Choice offered to:     DME Arranged:    DME Agency:     HH Arranged:    HH Agency:     Status of Service:     If discussed at [employer] of Stay Meetings, dates discussed:    Additional Comments: Update:   Pt successfully contacted Colchicine manufacture and was told that she would get the first 30 days free and the monthly copay will be $5 (pt was given all the information that she needs to present the pharmacy for this benefit).  Benefit check did not result insurance coverage.  CM contacted pharmacy of choice [facility] in [place].  Pharmacist did test run on colchicine and informed CM that brand name will cost $15 and generic will cost $50 per month (attending group informed).   Maryclare Labrador, RN 04/29/2018, 11:48 AM

## 2018-04-29 NOTE — Progress Notes (Signed)
PROGRESS NOTE    [name]  WEX:[id number] DOB: Jun 21, 1971 DOA: 04/27/2018 PCP: Judge Stall, FNP (Inactive)  Outpatient Specialists:   Brief Narrative: Patient is a 47 year old African-American female, with past medical history significant for endometriosis, miscarriage, HELLP syndrome approximately 13 years ago, now status post hysterectomy, hyperlipidemia and vitamin D deficiency who presented with chest pain.  Her presentation, EKG and physical exam consistent with pericarditis.  Cardiology consulted, initiated colchicine and ibuprofen, slowly improving. VQ scan was very low probability for pulmonary embolism.   Assessment & Plan:   Principal Problem:   Chest pain Active Problems:   SIRS (systemic inflammatory response syndrome) (HCC)   Peripheral neuropathy   NSTEMI (non-ST elevated myocardial infarction) (HCC)   Elevated troponin  Pericarditis: Cardiology was consulted.  Based on patient's presentation, EKG and physical exam, pericardial rub noted this morning) consistent with pericarditis.  Patient was started on scheduled colchicine and high-dose ibuprofen.  Her symptoms are slowly improving.  TTE: LVEF 93-81%, grade 2 diastolic dysfunction and trivial pericardial effusion noted.  DC home when chest pain better controlled.  As per cardiology, medications at discharge include ibuprofen 800 mg 3 times daily x10 days and colchicine 0.6 mg daily x3 months and outpatient follow-up with them in 2 to 4 weeks.  Elevated troponin: Initial concern for non-STEMI.  Patient had briefly been placed on IV heparin drip, now discontinued.  Cardiology input appreciated.  Elevated troponin likely due to component of myocarditis associated with pericarditis.  TTE shows normal LVEF.  No plans for further ischemic evaluation.  Elevated d-dimer: Likely nonspecific related to pericarditis.  VQ scan showed very low probability for PE.  Heparin discontinued.  Lower extremity venous Dopplers  negative.  SIRS (systemic inflammatory response syndrome) ([place]):  On admission patient had leukocytosis, tachycardia and tachypnea. Lactic acid are normal 1.5.  Urine microscopy, MRSA PCR negative.  Blood cultures negative to date.  Likely noninfectious due to pericarditis.  Resolved.  Peripheral neuropathy: A1c 5.6, TSH 0.478 and B12: 707.  Folate 7.  Left earache: Patient reported prior history of left ear infection, purulent drainage and need for intervention.  ENT consulted and diagnosed with left cerumen impaction and recommend outpatient follow-up early next week for ear cleaning.  Vitamin D deficiency: Resume supplements at discharge.  Hyperlipidemia: Not on medications PTA.  Normocytic anemia: May be chronic.  Outpatient follow-up.   DVT ppx:  SCDs. Code Status: Full code Family Communication: None at bedside today. Disposition Plan: DC home pending better control of her chest pain related to pericarditis and minimize use of opioids.  Possibly 9/6.   Consultants:   Cardiology.  ENT  Procedures:   Echocardiogram (kindly see above)  Antimicrobials:   None   Subjective: Overall feels better.  Chest pain down to 6/10, intermittent mostly at night.  No dyspnea.  Reports left ear ache, dull, deep, moderate in intensity, associated with some throat pain but no fever, chills, nasal stuffiness or drainage.  Objective: Vitals:   04/28/18 1926 04/29/18 0421 04/29/18 0747 04/29/18 1137  BP: 101/69 93/61 112/83 115/76  Pulse: 79 84 85 95  Resp: (!) 22 18 20 17   Temp: 97.9 F (36.6 C) 98.3 F (36.8 C) 98 F (36.7 C) 98 F (36.7 C)  TempSrc: Oral Oral Oral Oral  SpO2: 100% 98% 98% 100%  Weight:      Height:        Intake/Output Summary (Last 24 hours) at 04/29/2018 1502 Last data filed at 04/29/2018 1300 Gross per 24  hour  Intake 720 ml  Output 800 ml  Net -80 ml   Filed Weights   04/28/18 0032  Weight: 80 kg    Examination:  General exam: Pleasant young  female, well-built and nourished sitting up comfortably in bed. Respiratory system: Clear to auscultation.  No increased work of breathing. Cardiovascular system: S1-S2 heard, RRR.  No JVD, murmurs or pedal edema.  Pericardial rub +.  Telemetry personally reviewed: Sinus rhythm. Gastrointestinal system: Abdomen is nondistended, soft and nontender. No organomegaly or masses felt. Normal bowel sounds heard.  Stable. Central nervous system: Alert and oriented. No focal neurological deficits.  Stable. Extremities: Symmetric 5 x 5 power. Psychiatry: Judgement and insight appear normal. Mood & affect appropriate. ENT: Posterior pharyngeal wall minimal patchy erythema without exudate or ulcers.  Left external ear without acute findings.  Limited exam of auditory canal due to cerumen.  Visualized tympanic membrane without acute findings.  No cervical lymphadenopathy palpable.    Data Reviewed: I have personally reviewed following labs and imaging studies  CBC: Recent Labs  Lab 04/27/18 2029 04/28/18 1121 04/29/18 0309  WBC 15.1* 11.9* 10.4  NEUTROABS  --   --  5.9  HGB 14.2 10.8* 10.1*  HCT 45.4 33.7* 31.7*  MCV 98.7 97.4 97.8  PLT 227 193 573   Basic Metabolic Panel: Recent Labs  Lab 04/27/18 2029 04/29/18 0309  NA 140 138  K 3.8 4.4  CL 103 110  CO2 25 20*  GLUCOSE 139* 105*  BUN 9 12  CREATININE 0.86 0.84  CALCIUM 9.4 8.4*  PHOS  --  3.3   GFR: Estimated Creatinine Clearance: 92.1 mL/min (by C-G formula based on SCr of 0.84 mg/dL). Liver Function Tests: Recent Labs  Lab 04/29/18 0309  ALBUMIN 2.7*   Coagulation Profile: Recent Labs  Lab 04/27/18 2029  INR 1.11   Cardiac Enzymes: Recent Labs  Lab 04/28/18 0047 04/28/18 0514 04/28/18 1121  TROPONINI 0.75* 0.69* 0.41*   HbA1C: Recent Labs    04/28/18 0047  HGBA1C 5.6   Lipid Profile: Recent Labs    04/28/18 0047  CHOL 163  HDL 77  LDLCALC 78  TRIG 38  CHOLHDL 2.1   Thyroid Function Tests: Recent  Labs    04/28/18 0047  TSH 0.478   Anemia Panel: Recent Labs    04/28/18 0047  VITAMINB12 707  FOLATE 7.0    Recent Results (from the past 240 hour(s))  MRSA PCR Screening     Status: None   Collection Time: 04/28/18 12:13 AM  Result Value Ref Range Status   MRSA by PCR NEGATIVE NEGATIVE Final    Comment:        The GeneXpert MRSA Assay (FDA approved for NASAL specimens only), is one component of a comprehensive MRSA colonization surveillance program. It is not intended to diagnose MRSA infection nor to guide or monitor treatment for MRSA infections. Performed at [facility], [place] [address], [place], [place] [address]   Culture, blood (x 2)     Status: None (Preliminary result)   Collection Time: 04/28/18 12:47 AM  Result Value Ref Range Status   Specimen Description BLOOD LEFT ARM  Final   Special Requests   Final    BOTTLES DRAWN AEROBIC ONLY Blood Culture results may not be optimal due to an excessive volume of blood received in culture bottles   Culture   Final    NO GROWTH 1 DAY Performed at [facility], [place] [address],  [place], [place] [address]    Report Status PENDING  Incomplete  Culture, blood (x 2)     Status: None (Preliminary result)   Collection Time: 04/28/18 12:50 AM  Result Value Ref Range Status   Specimen Description BLOOD LEFT HAND  Final   Special Requests   Final    BOTTLES DRAWN AEROBIC ONLY Blood Culture adequate volume   Culture   Final    NO GROWTH 1 DAY Performed at [facility], [address] [place], [place] [address]    Report Status PENDING  Incomplete         Radiology Studies: Dg Chest 2 View  Result Date: 04/27/2018 CLINICAL DATA:  Chest pain radiating to the left shoulder. Onset 4 days ago. Shortness of breath and dry cough. EXAM: CHEST - 2 VIEW COMPARISON:  11/20/2008 FINDINGS: Low lung volumes. Atelectasis or fibrosis in the lung bases. Blunting of the costophrenic angles may indicate small amount  of fluid or thickened pleura. Heart size and pulmonary vascularity are normal. No focal consolidation. No pneumothorax. Mediastinal contours appear intact. IMPRESSION: Low lung volumes with atelectasis or fibrosis in the lung bases. Fluid or thickened pleura in the costophrenic angles. No focal consolidation. Electronically Signed   By: Lucienne Capers M.D.   On: 04/27/2018 21:17   Nm Pulmonary Perf And Vent  Result Date: 04/28/2018 CLINICAL DATA:  Chest pain and shortness of breath EXAM: NUCLEAR MEDICINE VENTILATION - PERFUSION LUNG SCAN VIEWS: Anterior, posterior, left lateral, right lateral, RPO, LPO, RAO, LAO-ventilation and perfusion RADIOPHARMACEUTICALS:  31.0 mCi of Tc-40m DTPA aerosol inhalation and 4.2 mCi Tc34m-MAA IV COMPARISON:  Chest radiograph April 27, 2018 FINDINGS: Ventilation: Radiotracer uptake on the ventilation study is homogeneous and symmetric bilaterally. No ventilation defects are identified. Perfusion: Radiotracer uptake on the perfusion study is homogeneous and symmetric bilaterally. No perfusion defects are identified. IMPRESSION: No ventilation or perfusion defects evident. Very low probability of pulmonary embolus. Electronically Signed   By: Lowella Grip III M.D.   On: 04/28/2018 14:18        Scheduled Meds: . atorvastatin  80 mg Oral q1800  . calcium-vitamin D  1 tablet Oral Q breakfast  . cholecalciferol  1,000 Units Oral Daily  . colchicine  0.6 mg Oral BID  . famotidine  20 mg Oral BID  . ibuprofen  800 mg Oral Q8H  . loratadine  10 mg Oral Daily  . multivitamin with minerals  1 tablet Oral Daily  . vitamin B-12  1,000 mcg Oral Daily   Continuous Infusions:   LOS: 1 day    Time spent: 35 minutes.   Vernell Leep, MD, FACP, [facility]. [facility] Pager [phone]  If 7PM-7AM, please contact night-coverage [email or web address] Password [email or web address] 04/29/2018, 3:17 PM

## 2018-04-29 NOTE — Progress Notes (Signed)
Progress Note  Patient Name: [name] Date of Encounter: 04/29/2018  Primary Cardiologist: Dr Stanford Breed   Subjective   Chest pain persists but improving; mild dyspnea  Inpatient Medications    Scheduled Meds: . atorvastatin  80 mg Oral q1800  . calcium-vitamin D  1 tablet Oral Q breakfast  . cholecalciferol  1,000 Units Oral Daily  . colchicine  0.6 mg Oral BID  . ibuprofen  800 mg Oral Q8H  . loratadine  10 mg Oral Daily  . multivitamin with minerals  1 tablet Oral Daily  . vitamin B-12  1,000 mcg Oral Daily   Continuous Infusions:  PRN Meds: acetaminophen, ALPRAZolam, dextromethorphan-guaiFENesin, hydrALAZINE, levalbuterol, morphine injection, nitroGLYCERIN, ondansetron (ZOFRAN) IV, oxyCODONE-acetaminophen, zolpidem   Vital Signs    Vitals:   04/28/18 1116 04/28/18 1556 04/28/18 1926 04/29/18 0421  BP: 102/66 110/64 101/69 93/61  Pulse: 79 94 79 84  Resp: (!) 25 (!) 21 (!) 22 18  Temp: 97.7 F (36.5 C) 98.2 F (36.8 C) 97.9 F (36.6 C) 98.3 F (36.8 C)  TempSrc: Oral Oral Oral Oral  SpO2: 94% 98% 100% 98%  Weight:      Height:        Intake/Output Summary (Last 24 hours) at 04/29/2018 0741 Last data filed at 04/29/2018 0158 Gross per 24 hour  Intake 1472 ml  Output 800 ml  Net 672 ml   Filed Weights   04/28/18 0032  Weight: 80 kg    Telemetry    Sinus with PVCs- Personally Reviewed  Physical Exam   GEN: No acute distress.   Neck: No JVD Cardiac: RRR, positive rub  Respiratory: Clear to auscultation bilaterally. GI: Soft, nontender, non-distended  MS: No edema Neuro:  Nonfocal  Psych: Normal affect   Labs    Chemistry Recent Labs  Lab 04/27/18 2029 04/29/18 0309  NA 140 138  K 3.8 4.4  CL 103 110  CO2 25 20*  GLUCOSE 139* 105*  BUN 9 12  CREATININE 0.86 0.84  CALCIUM 9.4 8.4*  ALBUMIN  --  2.7*  GFRNONAA >60 >60  GFRAA >60 >60  ANIONGAP 12 8     Hematology Recent Labs  Lab 04/27/18 2029 04/28/18 1121 04/29/18 0309    WBC 15.1* 11.9* 10.4  RBC 4.60 3.46* 3.24*  HGB 14.2 10.8* 10.1*  HCT 45.4 33.7* 31.7*  MCV 98.7 97.4 97.8  MCH 30.9 31.2 31.2  MCHC 31.3 32.0 31.9  RDW 13.5 13.8 13.8  PLT 227 193 183    Cardiac Enzymes Recent Labs  Lab 04/28/18 0047 04/28/18 0514 04/28/18 1121  TROPONINI 0.75* 0.69* 0.41*    Recent Labs  Lab 04/27/18 2042  TROPIPOC 0.07      DDimer  Recent Labs  Lab 04/27/18 2029  DDIMER 1.36*     Radiology    Dg Chest 2 View  Result Date: 04/27/2018 CLINICAL DATA:  Chest pain radiating to the left shoulder. Onset 4 days ago. Shortness of breath and dry cough. EXAM: CHEST - 2 VIEW COMPARISON:  11/20/2008 FINDINGS: Low lung volumes. Atelectasis or fibrosis in the lung bases. Blunting of the costophrenic angles may indicate small amount of fluid or thickened pleura. Heart size and pulmonary vascularity are normal. No focal consolidation. No pneumothorax. Mediastinal contours appear intact. IMPRESSION: Low lung volumes with atelectasis or fibrosis in the lung bases. Fluid or thickened pleura in the costophrenic angles. No focal consolidation. Electronically Signed   By: Lucienne Capers M.D.   On: 04/27/2018 21:17  Nm Pulmonary Perf And Vent  Result Date: 04/28/2018 CLINICAL DATA:  Chest pain and shortness of breath EXAM: NUCLEAR MEDICINE VENTILATION - PERFUSION LUNG SCAN VIEWS: Anterior, posterior, left lateral, right lateral, RPO, LPO, RAO, LAO-ventilation and perfusion RADIOPHARMACEUTICALS:  31.0 mCi of Tc-91m DTPA aerosol inhalation and 4.2 mCi Tc106m-MAA IV COMPARISON:  Chest radiograph April 27, 2018 FINDINGS: Ventilation: Radiotracer uptake on the ventilation study is homogeneous and symmetric bilaterally. No ventilation defects are identified. Perfusion: Radiotracer uptake on the perfusion study is homogeneous and symmetric bilaterally. No perfusion defects are identified. IMPRESSION: No ventilation or perfusion defects evident. Very low probability of pulmonary  embolus. Electronically Signed   By: Lowella Grip III M.D.   On: 04/28/2018 14:18    Patient Profile     47 y.o. female w/ hx HELLP w/ pregnancy, endometriosis s/p vag hyst, thyroid dysfunction w/out meds, was admitted 09/03 w/ CP/SOB, elevated troponin, seen by cards, sx concerning for pericarditis  Assessment & Plan    1 pericarditis-patient's presentation, electrocardiogram and physical examination (rub noted this morning) consistent with pericarditis.  Continue colchicine and ibuprofen.  Symptoms are improving this morning.  2 elevated troponin-likely component of myocarditis as well.  Echocardiogram shows normal LV function.  No plans for further ischemia evaluation.  3 elevated d-dimer-VQ scan is negative.  Heparin DC'd.  If patient's symptoms are controlled she can be discharged on ibuprofen at present dose for 10 days and colchicine 0.6 mg daily for 3 months.  Would arrange follow-up in our office 2 to 4 weeks following discharge.  We will continue to follow while in-house.  For questions or updates, please contact Makena Please consult [email or web address] for contact info under Cardiology/STEMI.      Signed, Kirk Ruths, MD  04/29/2018, 7:41 AM

## 2018-04-29 NOTE — Progress Notes (Signed)
Bilateral lower extremity venous duplex has been completed. Negative for DVT.  04/29/18 12:15 PM Carlos Levering RVT

## 2018-04-29 NOTE — Consult Note (Signed)
Reason for Consult: Ear problem Referring Physician: Hospitalist  [name] is an 47 y.o. female.  HPI: 47 year old female admitted two days ago with chest pain found to be due to pericarditis.  She has a couple of weeks of left ear clogging and more recent pain that she feels down to her throat.  Her ears have required irrigation for cerumen impaction in the past for similar symptoms.  She had tried OTC drops at home for the left ear prior to admission without benefit.  Symptoms are mild.  Past Medical History:  Diagnosis Date  . Acne   . Elevated troponin 04/28/2018  . Endometriosis   . HELLP (hemolytic anemia/elev liver enzymes/low platelets in pregnancy)    hx of two pregnancies  . Miscarriage   . Normal spontaneous vaginal delivery   . Thyroid dysfunction     Past Surgical History:  Procedure Laterality Date  . BILATERAL SALPINGECTOMY Right 10/16/2015   Procedure: RIGHT  SALPINGECTOMY;  Surgeon: Terrance Mass, MD;  Location: [facility];  Service: Gynecology;  Laterality: Right;  . BUNIONECTOMY Right 11/30/2015   @ [place]  . BUNIONECTOMY Left 12/21/2015   @ [place]  . BUNIONECTOMY Left 03/07/2016   _0    . CESAREAN SECTION    . CYSTOSCOPY N/A 10/16/2015   Procedure: CYSTOSCOPY;  Surgeon: Terrance Mass, MD;  Location: [facility];  Service: Gynecology;  Laterality: N/A;  . DILATION AND CURETTAGE OF UTERUS    . LAPAROSCOPIC VAGINAL HYSTERECTOMY WITH SALPINGO OOPHORECTOMY Left 10/16/2015   Procedure: LAPAROSCOPIC ASSISTED VAGINAL HYSTERECTOMY WITH SALPINGO OOPHORECTOMY;  Surgeon: Terrance Mass, MD;  Location: [facility];  Service: Gynecology;  Laterality: Left;  . TUBAL LIGATION      Family History  Problem Relation Age of Onset  . Diabetes Mother   . Hypertension Mother   . Thyroid disease Mother   . Cancer Mother        THYROID    Social History:  reports that she has never smoked. She has never used smokeless tobacco. She reports that she drinks alcohol. She reports that she does  not use drugs.  Allergies:  Allergies  Allergen Reactions  . Iodine Anaphylaxis  . Shellfish Allergy Anaphylaxis    Medications: I have reviewed the patient's current medications.  Results for orders placed or performed during the hospital encounter of 04/27/18 (from the past 48 hour(s))  Basic metabolic panel     Status: Abnormal   Collection Time: 04/27/18  8:29 PM  Result Value Ref Range   Sodium 140 135 - 145 mmol/L   Potassium 3.8 3.5 - 5.1 mmol/L   Chloride 103 98 - 111 mmol/L   CO2 25 22 - 32 mmol/L   Glucose, Bld 139 (H) 70 - 99 mg/dL   BUN 9 6 - 20 mg/dL   Creatinine, Ser 0.86 0.44 - 1.00 mg/dL   Calcium 9.4 8.9 - 10.3 mg/dL   GFR calc non Af Amer >60 >60 mL/min   GFR calc Af Amer >60 >60 mL/min    Comment: (NOTE) The eGFR has been calculated using the CKD EPI equation. This calculation has not been validated in all clinical situations. eGFR's persistently <60 mL/min signify possible Chronic Kidney Disease.    Anion gap 12 5 - 15    Comment: Performed at [facility] [address] [place], [place] [address]  CBC     Status: Abnormal   Collection Time: 04/27/18  8:29 PM  Result Value Ref Range  WBC 15.1 (H) 4.0 - 10.5 K/uL   RBC 4.60 3.87 - 5.11 MIL/uL   Hemoglobin 14.2 12.0 - 15.0 g/dL   HCT 45.4 36.0 - 46.0 %   MCV 98.7 78.0 - 100.0 fL   MCH 30.9 26.0 - 34.0 pg   MCHC 31.3 30.0 - 36.0 g/dL   RDW 13.5 11.5 - 15.5 %   Platelets 227 150 - 400 K/uL    Comment: Performed at [facility], [place] [address] [place], [place] [address]  Protime-INR (order if Patient is taking Coumadin / Warfarin)     Status: None   Collection Time: 04/27/18  8:29 PM  Result Value Ref Range   Prothrombin Time 14.2 11.4 - 15.2 seconds   INR 1.11     Comment: Performed at [facility] [address], [place], [place] [address]  D-dimer, quantitative (not at [facility])     Status: Abnormal   Collection Time: 04/27/18  8:29 PM  Result Value Ref Range   D-Dimer, Quant  1.36 (H) 0.00 - 0.50 ug/mL-FEU    Comment: (NOTE) At the manufacturer cut-off of 0.50 ug/mL FEU, this assay has been documented to exclude PE with a sensitivity and negative predictive value of 97 to 99%.  At this time, this assay has not been approved by the FDA to exclude DVT/VTE. Results should be correlated with clinical presentation. Performed at [facility], [place] [address] [place], [place] [address]   C-reactive protein     Status: Abnormal   Collection Time: 04/27/18  8:29 PM  Result Value Ref Range   CRP 11.9 (H) <1.0 mg/dL    Comment: Performed at [facility] [address], [place], [place] [address]  I-stat troponin, ED     Status: None   Collection Time: 04/27/18  8:42 PM  Result Value Ref Range   Troponin i, poc 0.07 0.00 - 0.08 ng/mL   Comment 3            Comment: Due to the release kinetics of cTnI, a negative result within the first hours of the onset of symptoms does not rule out myocardial infarction with certainty. If myocardial infarction is still suspected, repeat the test at appropriate intervals.   Sedimentation rate     Status: Abnormal   Collection Time: 04/27/18 10:22 PM  Result Value Ref Range   Sed Rate 30 (H) 0 - 22 mm/hr    Comment: Performed at [facility] [address] [place], [place] [address]  MRSA PCR Screening     Status: None   Collection Time: 04/28/18 12:13 AM  Result Value Ref Range   MRSA by PCR NEGATIVE NEGATIVE    Comment:        The GeneXpert MRSA Assay (FDA approved for NASAL specimens only), is one component of a comprehensive MRSA colonization surveillance program. It is not intended to diagnose MRSA infection nor to guide or monitor treatment for MRSA infections. Performed at [facility], [place] [address] [place], [place] [address]   Vitamin B12     Status: None   Collection Time: 04/28/18 12:47 AM  Result Value Ref Range   Vitamin B-12 707 180 - 914 pg/mL    Comment: (NOTE) This  assay is not validated for testing neonatal or myeloproliferative syndrome specimens for Vitamin B12 levels. Performed at [facility], [place] [address] [place], [place] [address]   TSH     Status:  None   Collection Time: 04/28/18 12:47 AM  Result Value Ref Range   TSH 0.478 0.350 - 4.500 uIU/mL    Comment: Performed by a 3rd Generation assay with a functional sensitivity of <=0.01 uIU/mL. Performed at [facility], [place] [address] [place], [place] [address]   Folate     Status: None   Collection Time: 04/28/18 12:47 AM  Result Value Ref Range   Folate 7.0 >5.9 ng/mL    Comment: Performed at [facility], [place] [address] [place], [place] [address]  Troponin I (q 6hr x 3)     Status: Abnormal   Collection Time: 04/28/18 12:47 AM  Result Value Ref Range   Troponin I 0.75 (HH) <0.03 ng/mL    Comment: CRITICAL RESULT CALLED TO, READ BACK BY AND VERIFIED WITH: LOWE A,RN 04/28/18 0213 WAYK Performed at [facility], [place] [address] [place], [place] [address]   hCG, quantitative, pregnancy     Status: None   Collection Time: 04/28/18 12:47 AM  Result Value Ref Range   hCG, Beta Chain, Quant, S <1 <5 mIU/mL    Comment:          GEST. AGE      CONC.  (mIU/mL)   <=1 WEEK        5 - 50     2 WEEKS       50 - 500     3 WEEKS       100 - 10,000     4 WEEKS     1,000 - 30,000     5 WEEKS     3,500 - 115,000   6-8 WEEKS     12,000 - 270,000    12 WEEKS     15,000 - 220,000        FEMALE AND NON-PREGNANT FEMALE:     LESS THAN 5 mIU/mL Performed at [facility], [place] [address] [place], [place] [address]   Hemoglobin A1c     Status: None   Collection Time: 04/28/18 12:47 AM  Result Value Ref Range   Hgb A1c MFr Bld 5.6 4.8 - 5.6 %    Comment: (NOTE) Pre diabetes:          5.7%-6.4% Diabetes:              >6.4% Glycemic control for   <7.0% adults with diabetes    Mean Plasma Glucose 114.02 mg/dL    Comment: Performed at [facility]  [address], [place], [place] [address]  Lipid panel     Status: None   Collection Time: 04/28/18 12:47 AM  Result Value Ref Range   Cholesterol 163 0 - 200 mg/dL   Triglycerides 38 <150 mg/dL   HDL 77 >40 mg/dL   Total CHOL/HDL Ratio 2.1 RATIO   VLDL 8 0 - 40 mg/dL   LDL Cholesterol 78 0 - 99 mg/dL    Comment:        Total Cholesterol/HDL:CHD Risk Coronary Heart Disease Risk Table                     Men   Women  1/2 Average Risk   3.4   3.3  Average Risk       5.0   4.4  2 X Average Risk   9.6   7.1  3 X Average Risk  23.4   11.0  Use the calculated Patient Ratio above and the CHD Risk Table to determine the patient's CHD Risk.        ATP III CLASSIFICATION (LDL):  <100     mg/dL   Optimal  100-129  mg/dL   Near or Above                    Optimal  130-159  mg/dL   Borderline  160-189  mg/dL   High  >190     mg/dL   Very High Performed at [facility] [address], [place], [place] [address]   Culture, blood (x 2)     Status: None (Preliminary result)   Collection Time: 04/28/18 12:47 AM  Result Value Ref Range   Specimen Description BLOOD LEFT ARM    Special Requests      BOTTLES DRAWN AEROBIC ONLY Blood Culture results may not be optimal due to an excessive volume of blood received in culture bottles   Culture      NO GROWTH 1 DAY Performed at [facility] [address], [place], [place] [address]    Report Status PENDING   Lactic acid, plasma     Status: None   Collection Time: 04/28/18 12:47 AM  Result Value Ref Range   Lactic Acid, Venous 1.5 0.5 - 1.9 mmol/L    Comment: Performed at [facility] [address] [place], [place] [address]  Procalcitonin     Status: None   Collection Time: 04/28/18 12:47 AM  Result Value Ref Range   Procalcitonin <0.10 ng/mL    Comment:        Interpretation: PCT (Procalcitonin) <= 0.5 ng/mL: Systemic infection (sepsis) is not likely. Local bacterial infection is possible. (NOTE)       Sepsis PCT  Algorithm           Lower Respiratory Tract                                      Infection PCT Algorithm    ----------------------------     ----------------------------         PCT < 0.25 ng/mL                PCT < 0.10 ng/mL         Strongly encourage             Strongly discourage   discontinuation of antibiotics    initiation of antibiotics    ----------------------------     -----------------------------       PCT 0.25 - 0.50 ng/mL            PCT 0.10 - 0.25 ng/mL               OR       >80% decrease in PCT            Discourage initiation of                                            antibiotics      Encourage discontinuation           of antibiotics    ----------------------------     -----------------------------         PCT >=  0.50 ng/mL              PCT 0.26 - 0.50 ng/mL               AND        <80% decrease in PCT             Encourage initiation of                                             antibiotics       Encourage continuation           of antibiotics    ----------------------------     -----------------------------        PCT >= 0.50 ng/mL                  PCT > 0.50 ng/mL               AND         increase in PCT                  Strongly encourage                                      initiation of antibiotics    Strongly encourage escalation           of antibiotics                                     -----------------------------                                           PCT <= 0.25 ng/mL                                                 OR                                        > 80% decrease in PCT                                     Discontinue / Do not initiate                                             antibiotics Performed at [facility], [address] [place], [place] [address]   HIV antibody (Routine Testing)     Status: None   Collection Time: 04/28/18 12:47 AM  Result Value Ref Range   HIV Screen 4th Generation wRfx Non Reactive Non  Reactive    Comment: (NOTE) Performed At: [facility] [place], [place] [id number] Rush Farmer MD  [id number]   Culture, blood (x 2)     Status: None (Preliminary result)   Collection Time: 04/28/18 12:50 AM  Result Value Ref Range   Specimen Description BLOOD LEFT HAND    Special Requests      BOTTLES DRAWN AEROBIC ONLY Blood Culture adequate volume   Culture      NO GROWTH 1 DAY Performed at [facility], [place] [address] [place], [place] [address]    Report Status PENDING   Troponin I (q 6hr x 3)     Status: Abnormal   Collection Time: 04/28/18  5:14 AM  Result Value Ref Range   Troponin I 0.69 (HH) <0.03 ng/mL    Comment: CRITICAL VALUE NOTED.  VALUE IS CONSISTENT WITH PREVIOUSLY REPORTED AND CALLED VALUE. Performed at [facility], [place] [address], [place], [place] [address]   Lactic acid, plasma     Status: None   Collection Time: 04/28/18  5:14 AM  Result Value Ref Range   Lactic Acid, Venous 0.7 0.5 - 1.9 mmol/L    Comment: Performed at [facility] [address] [place], [place] [address]  Heparin level (unfractionated)     Status: Abnormal   Collection Time: 04/28/18  7:19 AM  Result Value Ref Range   Heparin Unfractionated 0.99 (H) 0.30 - 0.70 IU/mL    Comment: (NOTE) If heparin results are below expected values, and patient dosage has  been confirmed, suggest follow up testing of antithrombin III levels. Performed at [facility], [place] [address], [place], [place] [address]   Troponin I (q 6hr x 3)     Status: Abnormal   Collection Time: 04/28/18 11:21 AM  Result Value Ref Range   Troponin I 0.41 (HH) <0.03 ng/mL    Comment: CRITICAL VALUE NOTED.  VALUE IS CONSISTENT WITH PREVIOUSLY REPORTED AND CALLED VALUE. Performed at [facility], [place] [address] [place], [place] [address]   CBC     Status: Abnormal   Collection Time: 04/28/18 11:21 AM  Result Value Ref Range   WBC 11.9 (H) 4.0 - 10.5 K/uL    RBC 3.46 (L) 3.87 - 5.11 MIL/uL   Hemoglobin 10.8 (L) 12.0 - 15.0 g/dL    Comment: RESULT REPEATED AND VERIFIED   HCT 33.7 (L) 36.0 - 46.0 %   MCV 97.4 78.0 - 100.0 fL   MCH 31.2 26.0 - 34.0 pg   MCHC 32.0 30.0 - 36.0 g/dL   RDW 13.8 11.5 - 15.5 %   Platelets 193 150 - 400 K/uL    Comment: Performed at [facility], [place] [address], [place], [place] [address]  Heparin level (unfractionated)     Status: Abnormal   Collection Time: 04/28/18  1:50 PM  Result Value Ref Range   Heparin Unfractionated 0.85 (H) 0.30 - 0.70 IU/mL    Comment: (NOTE) If heparin results are below expected values, and patient dosage has  been confirmed, suggest follow up testing of antithrombin III levels. Performed at [facility], [place] [address], [place], [place] [address]   Rapid urine drug screen (hospital performed)     Status: None   Collection Time: 04/28/18  6:02 PM  Result Value Ref Range   Opiates NONE DETECTED NONE DETECTED   Cocaine NONE DETECTED NONE DETECTED   Benzodiazepines NONE DETECTED NONE DETECTED   Amphetamines NONE DETECTED NONE DETECTED   Tetrahydrocannabinol NONE DETECTED NONE DETECTED   Barbiturates NONE DETECTED NONE  DETECTED    Comment: (NOTE) DRUG SCREEN FOR MEDICAL PURPOSES ONLY.  IF CONFIRMATION IS NEEDED FOR ANY PURPOSE, NOTIFY LAB WITHIN 5 DAYS. LOWEST DETECTABLE LIMITS FOR URINE DRUG SCREEN Drug Class                     Cutoff (ng/mL) Amphetamine and metabolites    1000 Barbiturate and metabolites    200 Benzodiazepine                 301 Tricyclics and metabolites     300 Opiates and metabolites        300 Cocaine and metabolites        300 THC                            50 Performed at [facility], [place] [address] [place], [place] [address]   Renal function panel     Status: Abnormal   Collection Time: 04/29/18  3:09 AM  Result Value Ref Range   Sodium 138 135 - 145 mmol/L   Potassium 4.4 3.5 - 5.1 mmol/L   Chloride 110 98 - 111 mmol/L    CO2 20 (L) 22 - 32 mmol/L   Glucose, Bld 105 (H) 70 - 99 mg/dL   BUN 12 6 - 20 mg/dL   Creatinine, Ser 0.84 0.44 - 1.00 mg/dL   Calcium 8.4 (L) 8.9 - 10.3 mg/dL   Phosphorus 3.3 2.5 - 4.6 mg/dL   Albumin 2.7 (L) 3.5 - 5.0 g/dL   GFR calc non Af Amer >60 >60 mL/min   GFR calc Af Amer >60 >60 mL/min    Comment: (NOTE) The eGFR has been calculated using the CKD EPI equation. This calculation has not been validated in all clinical situations. eGFR's persistently <60 mL/min signify possible Chronic Kidney Disease.    Anion gap 8 5 - 15    Comment: Performed at [facility] [address] [place], [place] [address]  CBC with Differential/Platelet     Status: Abnormal   Collection Time: 04/29/18  3:09 AM  Result Value Ref Range   WBC 10.4 4.0 - 10.5 K/uL   RBC 3.24 (L) 3.87 - 5.11 MIL/uL   Hemoglobin 10.1 (L) 12.0 - 15.0 g/dL   HCT 31.7 (L) 36.0 - 46.0 %   MCV 97.8 78.0 - 100.0 fL   MCH 31.2 26.0 - 34.0 pg   MCHC 31.9 30.0 - 36.0 g/dL   RDW 13.8 11.5 - 15.5 %   Platelets 183 150 - 400 K/uL   Neutrophils Relative % 56 %   Neutro Abs 5.9 1.7 - 7.7 K/uL   Lymphocytes Relative 28 %   Lymphs Abs 3.0 0.7 - 4.0 K/uL   Monocytes Relative 12 %   Monocytes Absolute 1.3 (H) 0.1 - 1.0 K/uL   Eosinophils Relative 2 %   Eosinophils Absolute 0.2 0.0 - 0.7 K/uL   Basophils Relative 1 %   Basophils Absolute 0.1 0.0 - 0.1 K/uL   Immature Granulocytes 1 %   Abs Immature Granulocytes 0.1 0.0 - 0.1 K/uL    Comment: Performed at [facility], [address], [place], [place] [address]    Dg Chest 2 View  Result Date: 04/27/2018 CLINICAL DATA:  Chest pain radiating to the left shoulder. Onset 4 days ago. Shortness of breath and dry cough. EXAM: CHEST - 2 VIEW COMPARISON:  11/20/2008 FINDINGS: Low lung  volumes. Atelectasis or fibrosis in the lung bases. Blunting of the costophrenic angles may indicate small amount of fluid or thickened pleura. Heart size and pulmonary vascularity are  normal. No focal consolidation. No pneumothorax. Mediastinal contours appear intact. IMPRESSION: Low lung volumes with atelectasis or fibrosis in the lung bases. Fluid or thickened pleura in the costophrenic angles. No focal consolidation. Electronically Signed   By: Lucienne Capers M.D.   On: 04/27/2018 21:17   Nm Pulmonary Perf And Vent  Result Date: 04/28/2018 CLINICAL DATA:  Chest pain and shortness of breath EXAM: NUCLEAR MEDICINE VENTILATION - PERFUSION LUNG SCAN VIEWS: Anterior, posterior, left lateral, right lateral, RPO, LPO, RAO, LAO-ventilation and perfusion RADIOPHARMACEUTICALS:  31.0 mCi of Tc-54mDTPA aerosol inhalation and 4.2 mCi Tc954mAA IV COMPARISON:  Chest radiograph April 27, 2018 FINDINGS: Ventilation: Radiotracer uptake on the ventilation study is homogeneous and symmetric bilaterally. No ventilation defects are identified. Perfusion: Radiotracer uptake on the perfusion study is homogeneous and symmetric bilaterally. No perfusion defects are identified. IMPRESSION: No ventilation or perfusion defects evident. Very low probability of pulmonary embolus. Electronically Signed   By: WiLowella GripII M.D.   On: 04/28/2018 14:18    Review of Systems  HENT: Positive for ear pain and hearing loss.   Cardiovascular: Positive for chest pain.  All other systems reviewed and are negative.  Blood pressure 112/83, pulse 85, temperature 98 F (36.7 C), temperature source Oral, resp. rate 20, height 5' 7.5" (1.715 m), weight 80 kg, last menstrual period 09/11/2015, SpO2 98 %. Physical Exam  Constitutional: She is oriented to person, place, and time. She appears well-developed and well-nourished. No distress.  HENT:  Head: Normocephalic and atraumatic.  Right Ear: External ear normal.  Nose: Nose normal.  Mouth/Throat: Oropharynx is clear and moist.  Left cerumen impaction, unable to visualize tympanic membrane.  Eyes: Pupils are equal, round, and reactive to light. Conjunctivae  and EOM are normal.  Neck: Normal range of motion. Neck supple.  Cardiovascular: Normal rate.  Respiratory: Effort normal.  Musculoskeletal: Normal range of motion.  Neurological: She is alert and oriented to person, place, and time. No cranial nerve deficit.  Skin: Skin is warm and dry.  Psychiatric: She has a normal mood and affect. Her behavior is normal. Judgment and thought content normal.    Assessment/Plan: Left cerumen impaction and otalgia The left ear is obstructed by cerumen.  She can follow-up in my office early next week for ear cleaning.  ,  04/29/2018, 11:35 AM

## 2018-04-30 ENCOUNTER — Inpatient Hospital Stay (HOSPITAL_COMMUNITY): Payer: BLUE CROSS/BLUE SHIELD

## 2018-04-30 DIAGNOSIS — M25512 Pain in left shoulder: Secondary | ICD-10-CM

## 2018-04-30 LAB — CBC
HEMATOCRIT: 31.4 % — AB (ref 36.0–46.0)
Hemoglobin: 10.1 g/dL — ABNORMAL LOW (ref 12.0–15.0)
MCH: 31 pg (ref 26.0–34.0)
MCHC: 32.2 g/dL (ref 30.0–36.0)
MCV: 96.3 fL (ref 78.0–100.0)
PLATELETS: 188 10*3/uL (ref 150–400)
RBC: 3.26 MIL/uL — ABNORMAL LOW (ref 3.87–5.11)
RDW: 13.5 % (ref 11.5–15.5)
WBC: 9.4 10*3/uL (ref 4.0–10.5)

## 2018-04-30 MED ORDER — NITROGLYCERIN 0.4 MG SL SUBL
SUBLINGUAL_TABLET | SUBLINGUAL | Status: AC
  Administered 2018-04-30: 0.4 mg
  Filled 2018-04-30: qty 1

## 2018-04-30 MED ORDER — BISACODYL 5 MG PO TBEC
5.0000 mg | DELAYED_RELEASE_TABLET | Freq: Every day | ORAL | Status: DC | PRN
  Administered 2018-04-30: 5 mg via ORAL
  Filled 2018-04-30: qty 1

## 2018-04-30 MED ORDER — ONDANSETRON HCL 4 MG PO TABS
4.0000 mg | ORAL_TABLET | Freq: Three times a day (TID) | ORAL | Status: DC | PRN
  Administered 2018-04-30 – 2018-05-01 (×2): 4 mg via ORAL
  Filled 2018-04-30 (×2): qty 1

## 2018-04-30 NOTE — Progress Notes (Signed)
PROGRESS NOTE    [name]  KKX:[id number] DOB: Dec 22, 1970 DOA: 04/27/2018 PCP: Judge Stall, FNP (Inactive)  Outpatient Specialists:   Brief Narrative: Patient is a 47 year old African-American female, with past medical history significant for endometriosis, miscarriage, HELLP syndrome approximately 13 years ago, now status post hysterectomy, hyperlipidemia and vitamin D deficiency who presented with chest pain.  Her presentation, EKG and physical exam consistent with pericarditis.  Cardiology consulted, initiated colchicine and ibuprofen, slowly improving. VQ scan was very low probability for pulmonary embolism.   Assessment & Plan:   Principal Problem:   Chest pain Active Problems:   SIRS (systemic inflammatory response syndrome) (HCC)   Peripheral neuropathy   NSTEMI (non-ST elevated myocardial infarction) (HCC)   Elevated troponin  Acute pericarditis/myocarditis: Cardiology was consulted.  Based on patient's presentation, EKG and physical exam, pericardial rub noted this morning) consistent with pericarditis.  Patient was started on scheduled colchicine and high-dose ibuprofen.  TTE: LVEF 16-96%, grade 2 diastolic dysfunction and trivial pericardial effusion noted.  DC home when chest pain better controlled.  As per cardiology, medications at discharge include ibuprofen 800 mg 3 times daily x10 days and colchicine 0.6 mg 2 times daily x3 months and outpatient follow-up with them in 2 to 4 weeks.  Chest pain continues to gradually improve.  However this afternoon patient started complaining of excruciating left shoulder pain.  Cardiology aware.  EKG shows sinus rhythm, diffuse ST segment elevation, possibly similar to 9/3 and likely related to pericarditis.  Her shoulder pain may be referred pain from pericarditis.  Patient requested and checking left shoulder x-ray.  Pain management.  I discussed this morning with Dr. Stanford Breed.  DC home when pain is adequately controlled.  Etiology not  clear.  Elevated troponin: Initial concern for non-STEMI.  Patient had briefly been placed on IV heparin drip, now discontinued.  Cardiology input appreciated.  Elevated troponin likely due to component of myocarditis associated with pericarditis.  TTE shows normal LVEF.  No plans for further ischemic evaluation.  Elevated d-dimer: Likely nonspecific related to pericarditis.  VQ scan showed very low probability for PE.  Heparin discontinued.  Lower extremity venous Dopplers negative.  SIRS (systemic inflammatory response syndrome) ([place]):  On admission patient had leukocytosis, tachycardia and tachypnea. Lactic acid are normal 1.5.  Urine microscopy, MRSA PCR negative.  Blood cultures negative to date.  Likely noninfectious due to pericarditis.  Resolved.  Peripheral neuropathy: A1c 5.6, TSH 0.478 and B12: 707.  Folate 7.  Left earache: Patient reported prior history of left ear infection, purulent drainage and need for intervention.  ENT consulted and diagnosed with left cerumen impaction and recommend outpatient follow-up early next week for ear cleaning.  Ear pain has decreased without intervention.  Vitamin D deficiency: Resume supplements at discharge.  Hyperlipidemia: Not on medications PTA.  Normocytic anemia: May be chronic.  Outpatient follow-up.   DVT ppx:  SCDs. Code Status: Full code Family Communication: None at bedside today. Disposition Plan: DC home pending improved pain control.  Still requiring intermittent opioids including IV for adequate pain control.   Consultants:   Cardiology.  ENT  Procedures:   Echocardiogram (kindly see above)  Antimicrobials:   None   Subjective: When seen this morning, reported feeling better with improvement of chest pain.  However early this afternoon reported excruciating left shoulder pain.  Objective: Vitals:   04/29/18 2338 04/30/18 0414 04/30/18 0733 04/30/18 1147  BP: 114/64 104/68 102/64 103/66  Pulse: 72 67  73    Resp:  17 17    Temp: 98.3 F (36.8 C) 97.7 F (36.5 C) 97.8 F (36.6 C) 98.2 F (36.8 C)  TempSrc: Oral Oral Oral Oral  SpO2: 95% 97%  98%  Weight:      Height:        Intake/Output Summary (Last 24 hours) at 04/30/2018 1555 Last data filed at 04/30/2018 1345 Gross per 24 hour  Intake 480 ml  Output 600 ml  Net -120 ml   Filed Weights   04/28/18 0032  Weight: 80 kg    Examination: No significant change in clinical exam compared to yesterday.  General exam: Pleasant young female, well-built and nourished sitting up comfortably in bed. Respiratory system: Clear to auscultation.  No increased work of breathing. Cardiovascular system: S1-S2 heard, RRR.  No JVD, murmurs or pedal edema.  Pericardial rub +.  Telemetry personally reviewed: Sinus rhythm. Gastrointestinal system: Abdomen is nondistended, soft and nontender. No organomegaly or masses felt. Normal bowel sounds heard.  Stable. Central nervous system: Alert and oriented. No focal neurological deficits.  Stable. Extremities: Symmetric 5 x 5 power. Psychiatry: Judgement and insight appear normal. Mood & affect appropriate. ENT: As examined on 9/5.  Posterior pharyngeal wall minimal patchy erythema without exudate or ulcers.  Left external ear without acute findings.  Limited exam of auditory canal due to cerumen.  Visualized tympanic membrane without acute findings.  No cervical lymphadenopathy palpable.    Data Reviewed: I have personally reviewed following labs and imaging studies  CBC: Recent Labs  Lab 04/27/18 2029 04/28/18 1121 04/29/18 0309 04/30/18 0313  WBC 15.1* 11.9* 10.4 9.4  NEUTROABS  --   --  5.9  --   HGB 14.2 10.8* 10.1* 10.1*  HCT 45.4 33.7* 31.7* 31.4*  MCV 98.7 97.4 97.8 96.3  PLT 227 193 183 509   Basic Metabolic Panel: Recent Labs  Lab 04/27/18 2029 04/29/18 0309  NA 140 138  K 3.8 4.4  CL 103 110  CO2 25 20*  GLUCOSE 139* 105*  BUN 9 12  CREATININE 0.86 0.84  CALCIUM 9.4 8.4*   PHOS  --  3.3   GFR: Estimated Creatinine Clearance: 92.1 mL/min (by C-G formula based on SCr of 0.84 mg/dL). Liver Function Tests: Recent Labs  Lab 04/29/18 0309  ALBUMIN 2.7*   Coagulation Profile: Recent Labs  Lab 04/27/18 2029  INR 1.11   Cardiac Enzymes: Recent Labs  Lab 04/28/18 0047 04/28/18 0514 04/28/18 1121  TROPONINI 0.75* 0.69* 0.41*   HbA1C: Recent Labs    04/28/18 0047  HGBA1C 5.6   Lipid Profile: Recent Labs    04/28/18 0047  CHOL 163  HDL 77  LDLCALC 78  TRIG 38  CHOLHDL 2.1   Thyroid Function Tests: Recent Labs    04/28/18 0047  TSH 0.478   Anemia Panel: Recent Labs    04/28/18 0047  VITAMINB12 707  FOLATE 7.0    Recent Results (from the past 240 hour(s))  MRSA PCR Screening     Status: None   Collection Time: 04/28/18 12:13 AM  Result Value Ref Range Status   MRSA by PCR NEGATIVE NEGATIVE Final    Comment:        The GeneXpert MRSA Assay (FDA approved for NASAL specimens only), is one component of a comprehensive MRSA colonization surveillance program. It is not intended to diagnose MRSA infection nor to guide or monitor treatment for MRSA infections. Performed at [facility], [place] [address], [place], [place] [address]  Culture, blood (x 2)     Status: None (Preliminary result)   Collection Time: 04/28/18 12:47 AM  Result Value Ref Range Status   Specimen Description BLOOD LEFT ARM  Final   Special Requests   Final    BOTTLES DRAWN AEROBIC ONLY Blood Culture results may not be optimal due to an excessive volume of blood received in culture bottles   Culture   Final    NO GROWTH 2 DAYS Performed at [facility] [address] [place], [place] [address]    Report Status PENDING  Incomplete  Culture, blood (x 2)     Status: None (Preliminary result)   Collection Time: 04/28/18 12:50 AM  Result Value Ref Range Status   Specimen Description BLOOD LEFT HAND  Final   Special Requests   Final    BOTTLES  DRAWN AEROBIC ONLY Blood Culture adequate volume   Culture   Final    NO GROWTH 2 DAYS Performed at [facility], [place] [address], [place], [place] [address]    Report Status PENDING  Incomplete         Radiology Studies: No results found.      Scheduled Meds: . atorvastatin  80 mg Oral q1800  . calcium-vitamin D  1 tablet Oral Q breakfast  . cholecalciferol  1,000 Units Oral Daily  . colchicine  0.6 mg Oral BID  . famotidine  20 mg Oral BID  . ibuprofen  800 mg Oral Q8H  . loratadine  10 mg Oral Daily  . multivitamin with minerals  1 tablet Oral Daily  . vitamin B-12  1,000 mcg Oral Daily   Continuous Infusions:   LOS: 2 days    Time spent: 35 minutes.   Vernell Leep, MD, FACP, [facility]. [facility] Pager [phone]  If 7PM-7AM, please contact night-coverage [email or web address] Password [email or web address] 04/30/2018, 3:55 PM

## 2018-04-30 NOTE — Progress Notes (Signed)
Morphine 2mg  wasted into sink with IT sales professional as witness as was painful on attempt to give through current IV. Marland KitchenEulis Manly, RN\

## 2018-04-30 NOTE — Progress Notes (Signed)
Pt's IV removed as it feels kinked under the skin, is very painful on flush, and won't pull back blood. Pt educated of importance of maintaining IV access for safety/emergency reasons but pt refusing stick for new IV.   [place]  Josalin Carneiro, RN

## 2018-04-30 NOTE — Progress Notes (Signed)
EKG CRITICAL VALUE     12 lead EKG performed.  Critical value noted.  Lucile Crater, RN notified.   Marilynne Halsted, CCT 04/30/2018 3:25 PM

## 2018-04-30 NOTE — Progress Notes (Signed)
Progress Note  Patient Name: [name] Date of Encounter: 04/30/2018  Primary Cardiologist: Dr Stanford Breed   Subjective   Chest pain remains but continues to improve; mild dyspnea  Inpatient Medications    Scheduled Meds: . atorvastatin  80 mg Oral q1800  . calcium-vitamin D  1 tablet Oral Q breakfast  . cholecalciferol  1,000 Units Oral Daily  . colchicine  0.6 mg Oral BID  . famotidine  20 mg Oral BID  . ibuprofen  800 mg Oral Q8H  . loratadine  10 mg Oral Daily  . multivitamin with minerals  1 tablet Oral Daily  . vitamin B-12  1,000 mcg Oral Daily   Continuous Infusions:  PRN Meds: acetaminophen, ALPRAZolam, dextromethorphan-guaiFENesin, hydrALAZINE, levalbuterol, morphine injection, ondansetron (ZOFRAN) IV, oxyCODONE-acetaminophen, zolpidem   Vital Signs    Vitals:   04/29/18 1941 04/29/18 2338 04/30/18 0414 04/30/18 0733  BP: 110/71 114/64 104/68 102/64  Pulse: 93 72 67   Resp: 18 17 17    Temp: 98.4 F (36.9 C) 98.3 F (36.8 C) 97.7 F (36.5 C) 97.8 F (36.6 C)  TempSrc: Oral Oral Oral Oral  SpO2: 95% 95% 97%   Weight:      Height:        Intake/Output Summary (Last 24 hours) at 04/30/2018 0827 Last data filed at 04/29/2018 1930 Gross per 24 hour  Intake 720 ml  Output 400 ml  Net 320 ml   Filed Weights   04/28/18 0032  Weight: 80 kg    Telemetry    Sinus with PVCs- Personally Reviewed  Physical Exam   GEN: No acute distress.  WD/WN Neck: No JVD, supple Cardiac: RRR, positive rub, no murmur  Respiratory: Clear to auscultation bilaterally; no wheeze GI: Soft, NT/ND MS: No edema Neuro:  Grossly intact  Labs    Chemistry Recent Labs  Lab 04/27/18 2029 04/29/18 0309  NA 140 138  K 3.8 4.4  CL 103 110  CO2 25 20*  GLUCOSE 139* 105*  BUN 9 12  CREATININE 0.86 0.84  CALCIUM 9.4 8.4*  ALBUMIN  --  2.7*  GFRNONAA >60 >60  GFRAA >60 >60  ANIONGAP 12 8     Hematology Recent Labs  Lab 04/28/18 1121 04/29/18 0309 04/30/18 0313   WBC 11.9* 10.4 9.4  RBC 3.46* 3.24* 3.26*  HGB 10.8* 10.1* 10.1*  HCT 33.7* 31.7* 31.4*  MCV 97.4 97.8 96.3  MCH 31.2 31.2 31.0  MCHC 32.0 31.9 32.2  RDW 13.8 13.8 13.5  PLT 193 183 188    Cardiac Enzymes Recent Labs  Lab 04/28/18 0047 04/28/18 0514 04/28/18 1121  TROPONINI 0.75* 0.69* 0.41*    Recent Labs  Lab 04/27/18 2042  TROPIPOC 0.07      DDimer  Recent Labs  Lab 04/27/18 2029  DDIMER 1.36*     Radiology    Nm Pulmonary Perf And Vent  Result Date: 04/28/2018 CLINICAL DATA:  Chest pain and shortness of breath EXAM: NUCLEAR MEDICINE VENTILATION - PERFUSION LUNG SCAN VIEWS: Anterior, posterior, left lateral, right lateral, RPO, LPO, RAO, LAO-ventilation and perfusion RADIOPHARMACEUTICALS:  31.0 mCi of Tc-73m DTPA aerosol inhalation and 4.2 mCi Tc40m-MAA IV COMPARISON:  Chest radiograph April 27, 2018 FINDINGS: Ventilation: Radiotracer uptake on the ventilation study is homogeneous and symmetric bilaterally. No ventilation defects are identified. Perfusion: Radiotracer uptake on the perfusion study is homogeneous and symmetric bilaterally. No perfusion defects are identified. IMPRESSION: No ventilation or perfusion defects evident. Very low probability of pulmonary embolus. Electronically Signed  By: Lowella Grip III M.D.   On: 04/28/2018 14:18    Patient Profile     47 y.o. female w/ hx HELLP w/ pregnancy, endometriosis s/p vag hyst, thyroid dysfunction w/out meds, was admitted 09/03 w/ CP/SOB, elevated troponin, seen by cards, sx concerning for pericarditis  Assessment & Plan    1 pericarditis-as outlined in previous notes patient's presentation is consistent with myocarditis/pericarditis.  Symptoms are improving.  Continue colchicine 0.6 mg daily for 3 months.  Continue ibuprofen for 10 days.   2 elevated troponin-felt secondary to myocarditis.  Previous echocardiogram showed normal LV function.    Okay to discharge from a cardiac standpoint.    [facility] will sign off.   Medication Recommendations: Colchicine and ibuprofen as outlined above. Other recommendations (labs, testing, etc): No further cardiac testing. Follow up as an outpatient: Transition of care appointment with APP 1 week.  Follow-up with me 3 months.  For questions or updates, please contact [place] Please consult [email or web address] for contact info under Cardiology/STEMI.      Signed, Kirk Ruths, MD  04/30/2018, 8:27 AM

## 2018-05-01 MED ORDER — FAMOTIDINE 20 MG PO TABS: 20.0000 mg | ORAL_TABLET | Freq: Two times a day (BID) | ORAL | 0 refills | Status: DC

## 2018-05-01 MED ORDER — OXYCODONE-ACETAMINOPHEN 5-325 MG PO TABS: 1.0000 | ORAL_TABLET | Freq: Four times a day (QID) | ORAL | 0 refills | Status: DC | PRN

## 2018-05-01 MED ORDER — IBUPROFEN 800 MG PO TABS: 800.0000 mg | ORAL_TABLET | Freq: Three times a day (TID) | ORAL | 0 refills | Status: DC

## 2018-05-01 NOTE — Progress Notes (Signed)
Progress Note  Patient Name: [name] Date of Encounter: 05/01/2018  Primary Cardiologist: Dr Stanford Breed   Subjective   Some CP with inspiration; complains of shoulder pain; mild dyspnea  Inpatient Medications    Scheduled Meds: . atorvastatin  80 mg Oral q1800  . calcium-vitamin D  1 tablet Oral Q breakfast  . cholecalciferol  1,000 Units Oral Daily  . colchicine  0.6 mg Oral BID  . famotidine  20 mg Oral BID  . ibuprofen  800 mg Oral Q8H  . loratadine  10 mg Oral Daily  . multivitamin with minerals  1 tablet Oral Daily  . vitamin B-12  1,000 mcg Oral Daily   Continuous Infusions:  PRN Meds: acetaminophen, ALPRAZolam, bisacodyl, dextromethorphan-guaiFENesin, hydrALAZINE, levalbuterol, morphine injection, ondansetron (ZOFRAN) IV, ondansetron, oxyCODONE-acetaminophen, zolpidem   Vital Signs    Vitals:   04/30/18 0733 04/30/18 1147 04/30/18 2014 05/01/18 0406  BP: 102/64 103/66 (!) 97/58 (!) 80/51  Pulse:  73 78 68  Resp:   20 20  Temp: 97.8 F (36.6 C) 98.2 F (36.8 C) 98 F (36.7 C) 98 F (36.7 C)  TempSrc: Oral Oral Oral Oral  SpO2:  98% 100% 100%  Weight:      Height:        Intake/Output Summary (Last 24 hours) at 05/01/2018 0614 Last data filed at 04/30/2018 1645 Gross per 24 hour  Intake 480 ml  Output 600 ml  Net -120 ml   Filed Weights   04/28/18 0032  Weight: 80 kg    Telemetry    Sinus with PVCs- Personally Reviewed  Physical Exam   GEN: NAD Neck: Supple Cardiac: RRR, no rub Respiratory: CTA GI: Soft, NT/ND, no masses MS: No edema Neuro:  no focal findings  Labs    Chemistry Recent Labs  Lab 04/27/18 2029 04/29/18 0309  NA 140 138  K 3.8 4.4  CL 103 110  CO2 25 20*  GLUCOSE 139* 105*  BUN 9 12  CREATININE 0.86 0.84  CALCIUM 9.4 8.4*  ALBUMIN  --  2.7*  GFRNONAA >60 >60  GFRAA >60 >60  ANIONGAP 12 8     Hematology Recent Labs  Lab 04/28/18 1121 04/29/18 0309 04/30/18 0313  WBC 11.9* 10.4 9.4  RBC 3.46* 3.24*  3.26*  HGB 10.8* 10.1* 10.1*  HCT 33.7* 31.7* 31.4*  MCV 97.4 97.8 96.3  MCH 31.2 31.2 31.0  MCHC 32.0 31.9 32.2  RDW 13.8 13.8 13.5  PLT 193 183 188    Cardiac Enzymes Recent Labs  Lab 04/28/18 0047 04/28/18 0514 04/28/18 1121  TROPONINI 0.75* 0.69* 0.41*    Recent Labs  Lab 04/27/18 2042  TROPIPOC 0.07      DDimer  Recent Labs  Lab 04/27/18 2029  DDIMER 1.36*     Radiology    Dg Shoulder Left Port  Result Date: 04/30/2018 CLINICAL DATA:  Acute LEFT shoulder pain.  Initial encounter. EXAM: LEFT SHOULDER - 1 VIEW COMPARISON:  None. FINDINGS: There is no evidence of fracture or dislocation. There is no evidence of arthropathy or other focal bone abnormality. Soft tissues are unremarkable. IMPRESSION: Negative. Electronically Signed   By: Margarette Canada M.D.   On: 04/30/2018 15:53    Patient Profile     47 y.o. female w/ hx HELLP w/ pregnancy, endometriosis s/p vag hyst, thyroid dysfunction w/out meds, was admitted 09/03 w/ CP/SOB, elevated troponin, seen by cards, sx concerning for pericarditis  Assessment & Plan    1 pericarditis-patient still has  some chest pain though improved compared to admission.  As outlined in previous notes we will continue medical therapy with colchicine and ibuprofen.  This should improve over the next several days.  2 elevated troponin-felt secondary to myocarditis.  Previous echocardiogram showed normal LV function.  No plans for further ischemia evaluation.  Note patient was initiated on Lipitor at time of admission.  She does not have coronary disease documented.  I will discontinue.  3 shoulder pain-x-rays negative.  Note she did receive a steroid  injection for this pain in May.  Management per primary care.  Okay to discharge from a cardiac standpoint.    [facility] will sign off.   Medication Recommendations: Colchicine and ibuprofen as outlined above. Other recommendations (labs, testing, etc): No further cardiac  testing. Follow up as an outpatient: Transition of care appointment with APP 1 week.  Follow-up with me 3 months.  For questions or updates, please contact [place] Please consult [email or web address] for contact info under Cardiology/STEMI.      Signed, Kirk Ruths, MD  05/01/2018, 6:14 AM

## 2018-05-01 NOTE — Progress Notes (Signed)
Pt d/c home in care of husband @ 09:00. 10:00am medications and pain med. Given prior to d/c. Pt  Is aware she may not drive while on pain medication. All questions answered.

## 2018-05-01 NOTE — Discharge Summary (Signed)
Physician Discharge Summary  [name] BWI:[id number] DOB: 06/18/1971  PCP: Kimberly Shanks, MD  Admit date: 04/27/2018 Discharge date: 05/01/2018  Recommendations for Outpatient Follow-up:  1. Dr. Yaakov Wilkinson, PCP in 3 days with repeat labs (CBC & BMP). 2. Dr. Melida Wilkinson, ENT on 05/03/2018. 3. Dr. Kirk Wilkinson, Cardiology: Office will arrange follow-up appointment to be seen in 1 week. 4. Recommend repeating chest x-ray in a couple of weeks to insure resolution of abnormal findings (atelectasis, pleural effusion versus thickened pleura at CP angles)  Home Health: None Equipment/Devices: None  Discharge Condition: Improved and stable. CODE STATUS: Full Diet recommendation: Regular diet.  Discharge Diagnoses:  Principal Problem:   Acute pericarditis Active Problems:   Chest pain   SIRS (systemic inflammatory response syndrome) (HCC)   Peripheral neuropathy   NSTEMI (non-ST elevated myocardial infarction) (HCC)   Elevated troponin   Brief Summary: Patient is a 47 year old African-American female, with past medical history significant for endometriosis, miscarriage, HELLP syndrome approximately 13 years ago, now status post hysterectomy, hyperlipidemia and vitamin D deficiency who presented with chest pain.  Her clinical presentation, EKG and physical exam were consistent with pericarditis.  Cardiology was consulted, initiated colchicine and ibuprofen, slowly improving. VQ scan was very low probability for pulmonary embolism.   Assessment & Plan:    Acute pericarditis/myocarditis: Cardiology was consulted. Patient's clinical presentation including symptoms, exam (pericardial rub), EKG findings were consistent with acute pericarditis.  Patient was started on scheduled colchicine and high-dose ibuprofen.  TTE: LVEF 63-84%, grade 2 diastolic dysfunction and trivial pericardial effusion noted.  Her chest pain continued to gradually improve.  On day prior to discharge, she developed  excruciating left shoulder pain which could have been due to radiation from her acute pericarditis pain.  Follow-up EKG showed sinus rhythm, diffuse ST segment elevations, similar to 9/3 and likely related to pericarditis.  Left shoulder x-ray did not show any acute findings. Patient was getting a Percocet (5/325 mg) which she used up to 3 times daily for pain not controlled with NSAIDs or colchicine.  Due to her uncontrolled pain/acute shoulder pain, her discharge was postponed by a day.  Today her chest pain continues to improve, left shoulder pain has improved but not resolved.  She was advised that her chest pain should improve over the next several days. Cardiology saw her and cleared her for discharge home. As per Cardiology, medications at discharge include ibuprofen 800 mg 3 times daily x10 days and colchicine 0.6 mg 2 times daily x3 months and outpatient follow-up with them.    Etiology of her pericarditis is unclear.  UDS negative.  Blood cultures x2: Negative to date.  HIV antibodies: Nonreactive.  Procalcitonin <0.1.  hCG <1.  ESR: 30.  CRP 11.9.  Elevated troponin: Initial concern for non-STEMI.  Patient had briefly been placed on IV heparin drip which was discontinued.  Cardiology input appreciated.  Elevated troponin likely due to component of myocarditis associated with pericarditis.  TTE shows normal LVEF.  No plans for further ischemic evaluation.  Elevated d-dimer: Likely nonspecific related to pericarditis.  VQ scan showed very low probability for PE.  Heparin discontinued.  Lower extremity venous Dopplers negative.  SIRS (systemic inflammatory response syndrome) ([place]): On admission patient had leukocytosis, tachycardia and tachypnea. Lactic acid are normal 1.5.  Urine microscopy, MRSA PCR negative.  Blood cultures negative to date.  Likely noninfectious due to pericarditis.  Resolved.  Peripheral neuropathy: A1c 5.6, TSH 0.478 and B12: 707.  Folate 7.  Left earache: Patient  reported prior history of left ear infection, purulent drainage and need for intervention.  ENT consulted and diagnosed with left cerumen impaction and recommend outpatient follow-up early next week for ear cleaning.  Ear pain has decreased without intervention.  Vitamin D deficiency: Resume supplements at discharge.  Hyperlipidemia: Not on medications PTA.  Due to concern for STEMI, she had been started on Lipitor at time of admission.  Since she does not have coronary disease, Cardiology discontinued Lipitor.  Normocytic anemia: May be chronic.  Outpatient follow-up.     Consultants:   Cardiology.  ENT  Procedures:   TTE 04/28/2018: Study Conclusions  - Left ventricle: The cavity size was normal. Wall thickness was   increased in a pattern of mild LVH. Systolic function was normal.   The estimated ejection fraction was in the range of 60% to 65%.   Wall motion was normal; there were no regional wall motion   abnormalities. Features are consistent with a pseudonormal left   ventricular filling pattern, with concomitant abnormal relaxation   and increased filling pressure (grade 2 diastolic dysfunction). - Pulmonary arteries: Systolic pressure was mildly increased. PA   peak pressure: 39 mm Hg (S). - Pericardium, extracardiac: A trivial pericardial effusion was   identified.    Bilateral lower extremity venous Dopplers 04/29/2018: Final Interpretation: Right: There is no evidence of deep vein thrombosis in the lower extremity. No cystic structure found in the popliteal fossa. Left: There is no evidence of deep vein thrombosis in the lower extremity. No cystic structure found in the popliteal fossa.   Discharge Instructions  Discharge Instructions    Call MD for:  difficulty breathing, headache or visual disturbances   Complete by:  As directed    Call MD for:  extreme fatigue   Complete by:  As directed    Call MD for:  persistant dizziness or light-headedness    Complete by:  As directed    Call MD for:  persistant nausea and vomiting   Complete by:  As directed    Call MD for:  severe uncontrolled pain   Complete by:  As directed    Call MD for:  temperature >100.4   Complete by:  As directed    Diet general   Complete by:  As directed    Increase activity slowly   Complete by:  As directed        Medication List    TAKE these medications   BL CALCIUM-MAGNESIUM-ZINC PO Take 1 tablet by mouth daily.   CALCIUM 1000 + D PO Take 1 tablet by mouth daily.   cetirizine 10 MG tablet Commonly known as:  ZYRTEC Take 10 mg by mouth daily as needed for allergies.   cholecalciferol 1000 units tablet Commonly known as:  VITAMIN D Take 1,000 Units by mouth daily.   clindamycin 1 % lotion Commonly known as:  CLEOCIN T Apply 1 application topically daily as needed (acne). Reported on 01/11/2016   Colchicine 0.6 MG Caps Take 0.6 mg by mouth 2 (two) times daily. Decrease to once daily for diarrhea.   famotidine 20 MG tablet Commonly known as:  PEPCID Take 1 tablet (20 mg total) by mouth 2 (two) times daily.   ibuprofen 800 MG tablet Commonly known as:  ADVIL,MOTRIN Take 1 tablet (800 mg total) by mouth 3 (three) times daily with meals.   multivitamin with minerals Tabs tablet Take 1 tablet by mouth daily.   oxyCODONE-acetaminophen 5-325 MG tablet Commonly  known as:  PERCOCET/ROXICET Take 1 tablet by mouth every 6 (six) hours as needed for moderate pain or severe pain.   VITAMIN B-12 PO Take 1 tablet by mouth daily.      Follow-up Information    Kimberly Quitter, MD. Schedule an appointment as soon as possible for a visit on 05/03/2018.   Specialty:  Otolaryngology Contact information: [address] [place] [address] [phone]        Kimberly Shanks, MD. Schedule an appointment as soon as possible for a visit in 3 day(s).   Specialty:  Family Medicine Why:  To be seen with repeat labs (CBC &  BMP). Contact information: [place] [address] [phone]        Lelon Perla, MD Follow up.   Specialty:  Cardiology Why:  Office will call you with follow up appointment to be seen in approximately 1 week. Contact information: [address] [place] [address] [phone]          Allergies  Allergen Reactions  . Iodine Anaphylaxis  . Shellfish Allergy Anaphylaxis      Procedures/Studies: Dg Chest 2 View  Result Date: 04/27/2018 CLINICAL DATA:  Chest pain radiating to the left shoulder. Onset 4 days ago. Shortness of breath and dry cough. EXAM: CHEST - 2 VIEW COMPARISON:  11/20/2008 FINDINGS: Low lung volumes. Atelectasis or fibrosis in the lung bases. Blunting of the costophrenic angles may indicate small amount of fluid or thickened pleura. Heart size and pulmonary vascularity are normal. No focal consolidation. No pneumothorax. Mediastinal contours appear intact. IMPRESSION: Low lung volumes with atelectasis or fibrosis in the lung bases. Fluid or thickened pleura in the costophrenic angles. No focal consolidation. Electronically Signed   By: Lucienne Capers M.D.   On: 04/27/2018 21:17   Nm Pulmonary Perf And Vent  Result Date: 04/28/2018 CLINICAL DATA:  Chest pain and shortness of breath EXAM: NUCLEAR MEDICINE VENTILATION - PERFUSION LUNG SCAN VIEWS: Anterior, posterior, left lateral, right lateral, RPO, LPO, RAO, LAO-ventilation and perfusion RADIOPHARMACEUTICALS:  31.0 mCi of Tc-39mDTPA aerosol inhalation and 4.2 mCi Tc963mAA IV COMPARISON:  Chest radiograph April 27, 2018 FINDINGS: Ventilation: Radiotracer uptake on the ventilation study is homogeneous and symmetric bilaterally. No ventilation defects are identified. Perfusion: Radiotracer uptake on the perfusion study is homogeneous and symmetric bilaterally. No perfusion defects are identified. IMPRESSION: No ventilation or perfusion defects evident. Very low probability of  pulmonary embolus. Electronically Signed   By: WiLowella GripII M.D.   On: 04/28/2018 14:18   Dg Shoulder Left Port  Result Date: 04/30/2018 CLINICAL DATA:  Acute LEFT shoulder pain.  Initial encounter. EXAM: LEFT SHOULDER - 1 VIEW COMPARISON:  None. FINDINGS: There is no evidence of fracture or dislocation. There is no evidence of arthropathy or other focal bone abnormality. Soft tissues are unremarkable. IMPRESSION: Negative. Electronically Signed   By: JeMargarette Canada.D.   On: 04/30/2018 15:53      Subjective: Chest pain continues to gradually improve, present mostly on inspiration.  Mild intermittent dyspnea with activity.  Left shoulder pain has improved but not completely resolved.  Discharge Exam:  Vitals:   04/30/18 1147 04/30/18 2014 05/01/18 0406 05/01/18 0745  BP: 103/66 (!) 97/58 (!) 80/51 110/74  Pulse: 73 78 68 91  Resp:  20 20 (!) 21  Temp: 98.2 F (36.8 C) 98 F (36.7 C) 98 F (36.7 C) 98.4 F (36.9 C)  TempSrc: Oral Oral Oral  Oral  SpO2: 98% 100% 100% 98%  Weight:      Height:        General exam: Pleasant young female, well-built and nourished sitting up comfortably in bed.  She did not appear in any distress. Respiratory system: Clear to auscultation.  No increased work of breathing. Cardiovascular system: S1-S2 heard, RRR.  No JVD, murmurs or pedal edema.  Pericardial rub +.   Gastrointestinal system: Abdomen is nondistended, soft and nontender. No organomegaly or masses felt. Normal bowel sounds heard.   Central nervous system: Alert and oriented. No focal neurological deficits.   Extremities: Symmetric 5 x 5 power.  Left shoulder exam with no acute findings, swelling or tenderness.  No restriction of range of movements which did not appear painful either. Psychiatry: Judgement and insight appear normal. Mood & affect appropriate. ENT: As examined on 9/5.  Posterior pharyngeal wall minimal patchy erythema without exudate or ulcers.  Left external ear  without acute findings.  Limited exam of auditory canal due to cerumen.  Visualized tympanic membrane without acute findings.  No cervical lymphadenopathy palpable.    The results of significant diagnostics from this hospitalization (including imaging, microbiology, ancillary and laboratory) are listed below for reference.     Microbiology: Recent Results (from the past 240 hour(s))  MRSA PCR Screening     Status: None   Collection Time: 04/28/18 12:13 AM  Result Value Ref Range Status   MRSA by PCR NEGATIVE NEGATIVE Final    Comment:        The GeneXpert MRSA Assay (FDA approved for NASAL specimens only), is one component of a comprehensive MRSA colonization surveillance program. It is not intended to diagnose MRSA infection nor to guide or monitor treatment for MRSA infections. Performed at [facility], [place] [address], [place], [place] [address]   Culture, blood (x 2)     Status: None (Preliminary result)   Collection Time: 04/28/18 12:47 AM  Result Value Ref Range Status   Specimen Description BLOOD LEFT ARM  Final   Special Requests   Final    BOTTLES DRAWN AEROBIC ONLY Blood Culture results may not be optimal due to an excessive volume of blood received in culture bottles   Culture   Final    NO GROWTH 3 DAYS Performed at [facility], [place] [address] [place], [place] [address]    Report Status PENDING  Incomplete  Culture, blood (x 2)     Status: None (Preliminary result)   Collection Time: 04/28/18 12:50 AM  Result Value Ref Range Status   Specimen Description BLOOD LEFT HAND  Final   Special Requests   Final    BOTTLES DRAWN AEROBIC ONLY Blood Culture adequate volume   Culture   Final    NO GROWTH 3 DAYS Performed at [facility], [place] [address], [place], [place] [address]    Report Status PENDING  Incomplete     Labs: CBC: Recent Labs  Lab 04/27/18 2029 04/28/18 1121 04/29/18 0309 04/30/18 0313  WBC 15.1* 11.9* 10.4 9.4  NEUTROABS   --   --  5.9  --   HGB 14.2 10.8* 10.1* 10.1*  HCT 45.4 33.7* 31.7* 31.4*  MCV 98.7 97.4 97.8 96.3  PLT 227 193 183 149   Basic Metabolic Panel: Recent Labs  Lab 04/27/18 2029 04/29/18 0309  NA 140 138  K 3.8 4.4  CL 103 110  CO2 25 20*  GLUCOSE 139* 105*  BUN 9  12  CREATININE 0.86 0.84  CALCIUM 9.4 8.4*  PHOS  --  3.3   Liver Function Tests: Recent Labs  Lab 04/29/18 0309  ALBUMIN 2.7*   Cardiac Enzymes: Recent Labs  Lab 04/28/18 0047 04/28/18 0514 04/28/18 1121  TROPONINI 0.75* 0.69* 0.41*       Time coordinating discharge: 50 minutes  SIGNED:  Vernell Leep, MD, FACP, [facility]. [facility] Pager [phone]  If 7PM-7AM, please contact night-coverage [email or web address] Password [facility] 05/01/2018, 5:54 PM

## 2018-05-01 NOTE — Progress Notes (Addendum)
I was paged by RN this morning at 7:33 AM and 7:43 AM stating that the patient was expecting to DC ASAP as she thought she was supposed to be discharged on 9/6 and also to call a Dr. Nyoka Cowden related to the patient.  Soon thereafter I proceeded to patient's room.  I introduced myself to Dr. Belinda Block (Anesthetist at [facility]) who was at patient's bedside and reportedly a well wisher.  Patient provided permission for me to discuss her care in the presence of Dr. Nyoka Cowden.  Patient was dissatisfied with some aspects of her care as outlined below.  I sat down and patiently listened to her complaints as outlined below and discussed with her in detail.  - Patient was under the impression that she was supposed to be discharged yesterday and wanted to know why she was not: I explained to her that the plans yesterday morning were actually to discharge her home that day.  I had even discussed with the Cardiologist who had cleared her for discharge.  However later in the day, patient developed significant left shoulder pain.  I then spoke with the nurse taking care of her and told her that since she was having significant pain, it might not be prudent or safe to send her home in that state and hence would likely keep her overnight until her symptoms are better.  It appears that the nurse did not pass on that information to the patient.  I apologized for this lack of communication on our part.  - She wanted to know the results of her left shoulder x-ray which had not been communicated to her and what were further plans for her left shoulder pain: I advised her that the x-ray findings were negative and again apologized for not informing her earlier of the results which I planned to during my visit today.  She stated that she had got a steroid shot in the left shoulder sometime back for similar pain.  I advised her that the current ibuprofen that she was taking for pericarditis may also help her left shoulder symptoms.   However if her shoulder pain worsens or does not improve, then she should seek an outpatient consultation with an orthopedic surgeon for further evaluation which may include an MRI.  - Patient stated that I told her that I would be giving her 1 or 2 pain pills (opioids) and was concerned that this would not be enough to control her pain: I advised her that that is not what I had told her and she may have either misheard or misinterpreted me.  I had clearly stated that I would give her a short supply/"couple of days" of the medications to last her until she follows up with her PCP and I had discussed the adverse effects of opioids.  - Patient complained that I had not cared for her with compassion: I acknowledged her perception but respectfully disagreed.  Patient had no further questions and requested to be discharged. After speaking to her, when I got up to examine her I noticed that she had been recording our entire conversation on her phone without my permission. When I asked her she told told me that she had been recording. I told her that she cannot record without my consent.     Dr. Nyoka Cowden then wished to speak to me privately outside the patient's room.  Patient provided permission for me to do so.  Dr. Nyoka Cowden informed me that she was the patient's "godmother" and  asked me if I knew who the patient was?  It was not clear to me what she meant.  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  Dr. Nyoka Cowden also seemed to take issue with the amount of opioids that I was willing to prescribe and stated that she knew the "stop act" and she did not agree with me.  She also seemed to imply that I was discriminating against the patient in someway when I informed the patient that I would be prescribing  only a limited supply of opioids and when I educated patient about the adverse effects and risks of opioids. She indicated that she was a president of some organization (I forget) and is well versed with "parity" issues. I informed Dr. Nyoka Cowden that I treat all my patients with respect and do not discriminate them in any aspect.  I also told her that I have the same opioid prescribing and education practice with all my patients.  I also alluded to the [facility] policy regarding prescribing opioids.  She then questioned why I did not call a Orthopedic consultation which was "easy" to get in the hospital when patient complained of shoulder pain.  She stated that patient had been complaining of left shoulder pain for several days. She named a former [facility] president and said that if it was that person as a patient then a consult would have already been called. I informed her that I had only taken care of of the patient since 04/29/2018 and she had not personally complained to me about her left shoulder pain until I was made aware on 9/6 afternoon.  I informed her that in my clinical opinion, I did not think an inpatient or urgent orthopedic consultation was needed.  After this discussion, Dr. Nyoka Cowden requested that I discharge the patient.  Patient was then discharged home in stable condition.  Vernell Leep, MD, FACP, [facility]. [facility] Pager [phone]  If 7PM-7AM, please contact night-coverage [email or web address] Password [facility] 05/01/2018, 5:28 PM

## 2018-05-01 NOTE — Discharge Instructions (Signed)
Please get your medications reviewed and adjusted by your Primary MD. ° °Please request your Primary MD to go over all Hospital Tests and Procedure/Radiological results at the follow up, please get all Hospital records sent to your Prim MD by signing hospital release before you go home. ° °If you had Pneumonia of Lung problems at the Hospital: °Please get a 2 view Chest X ray done in 6-8 weeks after hospital discharge or sooner if instructed by your Primary MD. ° °If you have Congestive Heart Failure: °Please call your Cardiologist or Primary MD anytime you have any of the following symptoms:  °1) 3 pound weight gain in 24 hours or 5 pounds in 1 week  °2) shortness of breath, with or without a dry hacking cough  °3) swelling in the hands, feet or stomach  °4) if you have to sleep on extra pillows at night in order to breathe ° °Follow cardiac low salt diet and 1.5 lit/day fluid restriction. ° °If you have diabetes °Accuchecks 4 times/day, Once in AM empty stomach and then before each meal. °Log in all results and show them to your primary doctor at your next visit. °If any glucose reading is under 80 or above 300 call your primary MD immediately. ° °If you have Seizure/Convulsions/Epilepsy: °Please do not drive, operate heavy machinery, participate in activities at heights or participate in high speed sports until you have seen by Primary MD or a Neurologist and advised to do so again. ° °If you had Gastrointestinal Bleeding: °Please ask your Primary MD to check a complete blood count within one week of discharge or at your next visit. Your endoscopic/colonoscopic biopsies that are pending at the time of discharge, will also need to followed by your Primary MD. ° °Get Medicines reviewed and adjusted. °Please take all your medications with you for your next visit with your Primary MD ° °Please request your Primary MD to go over all hospital tests and procedure/radiological results at the follow up, please ask your  Primary MD to get all Hospital records sent to his/her office. ° °If you experience worsening of your admission symptoms, develop shortness of breath, life threatening emergency, suicidal or homicidal thoughts you must seek medical attention immediately by calling 911 or calling your MD immediately  if symptoms less severe. ° °You must read complete instructions/literature along with all the possible adverse reactions/side effects for all the Medicines you take and that have been prescribed to you. Take any new Medicines after you have completely understood and accpet all the possible adverse reactions/side effects.  ° °Do not drive or operate heavy machinery when taking Pain medications.  ° °Do not take more than prescribed Pain, Sleep and Anxiety Medications ° °Special Instructions: If you have smoked or chewed Tobacco  in the last 2 yrs please stop smoking, stop any regular Alcohol  and or any Recreational drug use. ° °Wear Seat belts while driving. ° °Please note °You were cared for by a hospitalist during your hospital stay. If you have any questions about your discharge medications or the care you received while you were in the hospital after you are discharged, you can call the unit and asked to speak with the hospitalist on call if the hospitalist that took care of you is not available. Once you are discharged, your primary care physician will handle any further medical issues. Please note that NO REFILLS for any discharge medications will be authorized once you are discharged, as it is imperative that you   return to your primary care physician (or establish a relationship with a primary care physician if you do not have one) for your aftercare needs so that they can reassess your need for medications and monitor your lab values.  You can reach the hospitalist office at phone [phone] or fax [phone]   If you do not have a primary care physician, you can call [phone] for a physician  referral.   Pericarditis Pericarditis is swelling and irritation (inflammation) of your pericardium. The pericardium is a thin, double-layered, fluid-filled sac that surrounds your heart. The pericardium protects and holds your heart in your chest cavity. Inflammation of your pericardium can cause rubbing (friction) between the two layers when your heart beats. Fluid may build up between the layers of the sac (pericardial effusion). Different types of pericarditis include:  Acute pericarditis. Inflammation develops suddenly and causes pericardial effusion.  Chronic pericarditis. Inflammation may develop gradually, or it may continue after acute pericarditis and last longer than 6 months.  Constrictive pericarditis. The layers of the pericardium stiffen and develop scar tissue. The scar tissue thickens and sticks together. This makes it difficult for the heart to pump and to work as it normally does. This type is rare.  In most cases, pericarditis is acute and not serious. Chronic pericarditis and constrictive pericarditis may be more serious and may require treatment. What are the causes? Often, the cause of pericarditis is not known.If a cause is found, the cause may be:  A viral infection.  A heart attack (myocardial infarction).  Open-heart surgery (coronary artery bypass graft surgery).  Chest injury.  Autoimmune conditions, such as lupus or rheumatoid arthritis.  Kidney failure.  Low-functioning thyroid gland (hypothyroidism).  Cancer from another part of the body that has spread (metastasized) to the pericardium.  Radiation treatment.  Certain medicines, including some seizure medicines, blood thinners, heart medicines, and antibiotics.  A bacterial or fungal infection. This cause is less common.  What increases the risk? The following factors may increase your risk of pericarditis:  Being female.  Being 21-75 years old.  Having had pericarditis before.  Having  had a recent upper respiratory tract infection.  What are the signs or symptoms? The most common symptom of pericarditis is chest pain. This pain may:  Be in the center of your chest or the left side of your chest.  Not go away with rest.  Last for many hours or days.  Worsen when you lie down and go away when you sit up and lean forward.  Worsen when you swallow.  Move to your back, neck, or shoulder.  Other symptoms may include:  A chronic, dry cough.  Heart palpitations. These may feel like rapid, fluttering, or pounding heartbeats.  Dizziness or fainting.  Tiredness or fatigue.  Fever.  Rapid breathing.  Shortness of breath when lying down.  How is this diagnosed? This condition is diagnosed with a medical history, physical exam, and diagnostic tests. During your physical exam, your health care provider will listen for friction while your heart beats (pericardial rub). You may also have tests, including:  Blood work to look for signs of infection and inflammation.  Electrocardiogram (ECG).  Echocardiogram.  CT scan.  MRI.  Culture of pericardial fluid.  A tissue sample (biopsy) of the pericardium.  If tests show that you may have constrictive pericarditis, you may have a procedure (cardiac catheterization) to confirm this diagnosis. How is this treated? Treatment for this condition depends on the cause and type of  pericarditis. In most cases, acute pericarditis will clear up on its own within 10 days. Treatment for other types of pericarditis may include:  Medicines, such as: ? NSAIDs for pain and inflammation. ? Steroids to reduce inflammation. ? Colchicine to relieve pain and inflammation.  A procedure to remove fluid using a needle (pericardiocentesis) if pericardial effusion puts pressure on the heart.  Surgery to remove part of the pericardium if constrictive pericarditis develops.  If another condition is causing your pericarditis, you may need  treatment for that underlying condition. Follow these instructions at home:  Do not use tobacco products, including cigarettes, chewing tobacco, or e-cigarettes. If you need help quitting, ask your health care provider.  Maintain a healthy weight.  Follow an exercise program as told by your health care provider. You may need to limit your exercise until your symptoms go away.  Eat a heart-healthy diet. A registered dietitian can help you to learn about healthy food choices.  Take over-the-counter and prescription medicines only as told by your health care provider. Keep a list of all of your medicines with you at all times. For each medicine, include information about the name, the dosage, how often you take it, and how you take it.  Keep all follow-up visits as told by your health care provider. This is important. Contact a health care provider if:  You continue to have symptoms of pericarditis.  You develop new symptoms of pericarditis.  Your symptoms get worse. Get help right away if:  You have worsening chest pain and difficulty breathing. These symptoms may represent a serious problem that is an emergency. Do not wait to see if the symptoms will go away. Get medical help right away. Call your local emergency services (911 in the [place] Do not drive yourself to the hospital. This information is not intended to replace advice given to you by your health care provider. Make sure you discuss any questions you have with your health care provider. Document Released: 02/04/2001 Document Revised: 01/14/2016 Document Reviewed: 02/21/2015 Elsevier Interactive Patient Education  2018 [employer].    Shoulder Pain Many things can cause shoulder pain, including:  An injury to the area.  Overuse of the shoulder.  Arthritis.  The source of the pain can be:  Inflammation.  An injury to the shoulder joint.  An injury to a tendon, ligament, or bone.  Follow these instructions at  home: Take these actions to help with your pain:  Squeeze a soft ball or a foam pad as much as possible. This helps to keep the shoulder from swelling. It also helps to strengthen the arm.  Take over-the-counter and prescription medicines only as told by your health care provider.  If directed, apply ice to the area: ? Put ice in a plastic bag. ? Place a towel between your skin and the bag. ? Leave the ice on for 20 minutes, 2-3 times per day. Stop applying ice if it does not help with the pain.  If you were given a shoulder sling or immobilizer: ? Wear it as told. ? Remove it to shower or bathe. ? Move your arm as little as possible, but keep your hand moving to prevent swelling.  Contact a health care provider if:  Your pain gets worse.  Your pain is not relieved with medicines.  New pain develops in your arm, hand, or fingers. Get help right away if:  Your arm, hand, or fingers: ? Tingle. ? Become numb. ? Become swollen. ?  Become painful. ? Turn white or blue. This information is not intended to replace advice given to you by your health care provider. Make sure you discuss any questions you have with your health care provider. Document Released: 05/21/2005 Document Revised: 04/06/2016 Document Reviewed: 12/04/2014 Elsevier Interactive Patient Education  2018 [employer].    Pain Medicine Instructions How can pain medicine affect me? You were given a prescription for pain medicine. This medicine may make you tired or drowsy and may affect your ability to think clearly. Pain medicine may also affect your ability to drive or perform certain physical activities. It may not be possible to make all of your pain go away, but you should be comfortable enough to move, breathe, and take care of yourself. How often should I take pain medicine and how much should I take?  Take pain medicine only as directed by your health care provider and only as needed for pain.  You do not  need to take pain medicine if you are not having pain, unless directed by your health care provider.  You can take less than the prescribed dose if you find that a smaller amount of medicine controls your pain. What restrictions do I have while taking pain medicine? Follow these instructions after you start taking pain medicine, while you are taking the medicine, and for 8 hours after you stop taking the medicine:  Do not drive.  Do not operate machinery.  Do not operate power tools.  Do not sign legal documents.  Do not drink alcohol.  Do not take sleeping pills.  Do not supervise children by yourself.  Do not participate in activities that require climbing or being in high places.  Do not enter a body of water--such as a lake, river, ocean, spa, or swimming pool--without an adult nearby who can monitor and help you.  How can I keep others safe while I am taking pain medicine?  Store your pain medicine as directed by your health care provider. Make sure that it is placed where children and pets cannot reach it.  Never share your pain medicine with anyone.  Do not save any leftover pills. If you have any leftover pain medicine, get rid of it or destroy it as directed by your health care provider. What else do I need to know about taking pain medicine?  Use a stool softener if you become constipated from your pain medicine. Increasing your intake of fruits and vegetables will also help with constipation.  Write down the times when you take your pain medicine. Look at the times before you take your next dose of medicine. It is easy to become confused while on pain medicine. Recording the times helps you to avoid an overdose.  If your pain is severe, do not try to treat it yourself by taking more pills than instructed on your prescription. Contact your health care provider for help.  You may have been prescribed a pain medicine that contains acetaminophen. Do not take any other  acetaminophen while taking this medicine. An overdose of acetaminophen can result in severe liver damage. Acetaminophen is found in many over-the-counter (OTC) and prescription medicines. If you are taking any medicines in addition to your pain medicine, check the active ingredients on those medicines to see if acetaminophen is listed. When should I call my health care provider?  Your medicine is not helping to make the pain go away.  You vomit or have diarrhea shortly after taking the medicine.  You  develop new pain in areas that did not hurt before.  You have an allergic reaction to your medicine. This may include: ? Itchiness. ? Swelling. ? Dizziness. ? Developing a new rash. When should I call 911 or go to the emergency room?  You feel dizzy or you faint.  You are very confused or disoriented.  You repeatedly vomit.  Your skin or lips turn pale or bluish in color.  You have shortness of breath or you are breathing much more slowly than usual.  You have a severe allergic reaction to your medicine. This includes: ? Developing tongue swelling. ? Having difficulty breathing. This information is not intended to replace advice given to you by your health care provider. Make sure you discuss any questions you have with your health care provider. Document Released: 11/17/2000 Document Revised: 02/29/2016 Document Reviewed: 06/15/2014 Elsevier Interactive Patient Education  2018 [employer].

## 2018-05-03 LAB — CULTURE, BLOOD (ROUTINE X 2)
CULTURE: NO GROWTH
Culture: NO GROWTH
SPECIAL REQUESTS: ADEQUATE

## 2018-05-04 DIAGNOSIS — H938X1 Other specified disorders of right ear: Secondary | ICD-10-CM | POA: Diagnosis not present

## 2018-05-04 DIAGNOSIS — Z7289 Other problems related to lifestyle: Secondary | ICD-10-CM | POA: Diagnosis not present

## 2018-05-04 DIAGNOSIS — H6122 Impacted cerumen, left ear: Secondary | ICD-10-CM | POA: Diagnosis not present

## 2018-05-04 DIAGNOSIS — H6123 Impacted cerumen, bilateral: Secondary | ICD-10-CM | POA: Diagnosis not present

## 2018-05-04 DIAGNOSIS — H9202 Otalgia, left ear: Secondary | ICD-10-CM | POA: Diagnosis not present

## 2018-05-04 DIAGNOSIS — I300 Acute nonspecific idiopathic pericarditis: Secondary | ICD-10-CM | POA: Diagnosis not present

## 2018-05-05 DIAGNOSIS — M25512 Pain in left shoulder: Secondary | ICD-10-CM | POA: Diagnosis not present

## 2018-05-06 ENCOUNTER — Encounter: Payer: Self-pay | Admitting: Cardiovascular Disease

## 2018-05-06 ENCOUNTER — Ambulatory Visit: Payer: BLUE CROSS/BLUE SHIELD | Admitting: Physician Assistant

## 2018-05-06 ENCOUNTER — Ambulatory Visit: Payer: BLUE CROSS/BLUE SHIELD | Admitting: Cardiovascular Disease

## 2018-05-06 VITALS — BP 126/86 | HR 91 | Ht 67.5 in | Wt 177.0 lb

## 2018-05-06 DIAGNOSIS — I309 Acute pericarditis, unspecified: Secondary | ICD-10-CM | POA: Diagnosis not present

## 2018-05-06 NOTE — Progress Notes (Signed)
Cardiology Office Note   Date:  05/06/2018   ID:  [name], DOB [birth year], MRN [id number]  PCP:  Ileana Ladd, MD  Cardiologist:   Chilton Si, MD   No chief complaint on file.     History of Present Illness: [name] is a 47 y.o. female who presents for follow up.  [name] was admitted 04/2018 with chest pain.  She has a history of palpitations that she attributes to stress at work.  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  However, for the week before she went to the hospital she noted feeling more anxious.  She had no URI at the time.  She started taking magnesium powder to see if that would help.  She also thought it may be due to not exercising as much.  She started doing yoga and felt pain in her L arm that radiated into the arm and chest when bending over.  She took advil which helped.  However the following day she had persistent shortness of breath so she presented to the ED.  In the ED her EKG was initially concerning for STEMI. Troponin was elevated to 0.75 and ESR was 30.  She was thought to have myopericarditis and was started on ibuprofen and colchicine.  She was also given a prescription for oxycodone.  Since leaving the hospital she has been feeling better.  She also saw her orthopedic surgeon because she was concerned that her chronic left shoulder injury may be contributing.  She had an injection which did alleviate some of the pain that was radiating across her chest.  She does continue to have some pleuritic chest pain and feels as though she cannot get a full breath.  She has not had any lower extremity edema, orthopnea, or PND.  She has yet to return to work and is afraid to exert herself too much.  She had no preceding upper respiratory symptoms.  She is followed up with her primary care provider and reportedly had a positive rheumatoid factor.  She is no longer taking oxycodone because it made her hallucinate.    Past Medical  History:  Diagnosis Date  . Acne   . Elevated troponin 04/28/2018  . Endometriosis   . HELLP (hemolytic anemia/elev liver enzymes/low platelets in pregnancy)    hx of two pregnancies  . Miscarriage   . Normal spontaneous vaginal delivery   . Thyroid dysfunction     Past Surgical History:  Procedure Laterality Date  . BILATERAL SALPINGECTOMY Right 10/16/2015   Procedure: RIGHT  SALPINGECTOMY;  Surgeon: Ok Edwards, MD;  Location: [facility];  Service: Gynecology;  Laterality: Right;  . BUNIONECTOMY Right 11/30/2015   @ [facility]  . BUNIONECTOMY Left 12/21/2015   @ [facility]  . BUNIONECTOMY Left 03/07/2016   @[facility]    . CESAREAN SECTION    . CYSTOSCOPY N/A 10/16/2015   Procedure: CYSTOSCOPY;  Surgeon: Ok Edwards, MD;  Location: [facility];  Service: Gynecology;  Laterality: N/A;  . DILATION AND CURETTAGE OF UTERUS    . LAPAROSCOPIC VAGINAL HYSTERECTOMY WITH SALPINGO OOPHORECTOMY Left 10/16/2015   Procedure: LAPAROSCOPIC ASSISTED VAGINAL HYSTERECTOMY WITH SALPINGO OOPHORECTOMY;  Surgeon: Ok Edwards, MD;  Location: [facility];  Service: Gynecology;  Laterality: Left;  . TUBAL LIGATION       Current Outpatient Medications  Medication Sig Dispense Refill  . BL CALCIUM-MAGNESIUM-ZINC PO Take 1 tablet by mouth daily.    . Calcium Carb-Cholecalciferol (CALCIUM  1000 + D PO) Take 1 tablet by mouth daily.    . cetirizine (ZYRTEC) 10 MG tablet Take 10 mg by mouth daily as needed for allergies.     . cholecalciferol (VITAMIN D) 1000 units tablet Take 1,000 Units by mouth daily.    . clindamycin (CLEOCIN T) 1 % lotion Apply 1 application topically daily as needed (acne). Reported on 01/11/2016    . Colchicine (MITIGARE) 0.6 MG CAPS Take 0.6 mg by mouth 2 (two) times daily. Decrease to once daily for diarrhea. 60 capsule 3  . Cyanocobalamin (VITAMIN B-12 PO) Take 1 tablet by mouth daily.    . famotidine (PEPCID) 20 MG tablet Take 1 tablet (20 mg total) by mouth 2 (two) times daily. 30 tablet 0  . ibuprofen  (ADVIL,MOTRIN) 800 MG tablet Take 1 tablet (800 mg total) by mouth 3 (three) times daily with meals. 30 tablet 0  . Multiple Vitamin (MULTIVITAMIN WITH MINERALS) TABS tablet Take 1 tablet by mouth daily.     No current facility-administered medications for this visit.     Allergies:   Iodine and Shellfish allergy    Social History:  The patient  reports that she has never smoked. She has never used smokeless tobacco. She reports that she drinks alcohol. She reports that she does not use drugs.   Family History:  [name] family history includes Cancer in her mother; Cataracts in her father; Diabetes in her brother, maternal grandmother, and mother; Heart disease in her maternal grandfather; Heart failure in her maternal grandfather; Hypertension in her mother; Kidney failure in her maternal grandmother; Lymphoma in her father; Sickle cell anemia in her maternal aunt; Stroke in her maternal grandfather; Thyroid disease in her mother.    ROS:  Please see the history of present illness.   Otherwise, review of systems are positive for none.   All other systems are reviewed and negative.    PHYSICAL EXAM: VS:  BP 126/86   Pulse 91   Ht 5' 7.5" (1.715 m)   Wt 177 lb (80.3 kg)   LMP 09/11/2015   SpO2 97%   BMI 27.31 kg/m  , BMI Body mass index is 27.31 kg/m. GENERAL:  Well appearing HEENT:  Pupils equal round and reactive, fundi not visualized, oral mucosa unremarkable NECK:  No jugular venous distention, waveform within normal limits, carotid upstroke brisk and symmetric, no bruits LUNGS:  Clear to auscultation bilaterally HEART:  RRR.  PMI not displaced or sustained,S1 and S2 within normal limits, no S3, no S4, no clicks, no rubs, no murmurs ABD:  Flat, positive bowel sounds normal in frequency in pitch, no bruits, no rebound, no guarding, no midline pulsatile mass, no hepatomegaly, no splenomegaly EXT:  2 plus pulses throughout, no edema, no cyanosis no clubbing SKIN:  No rashes no  nodules NEURO:  Cranial nerves II through XII grossly intact, motor grossly intact throughout PSYCH:  Cognitively intact, oriented to person place and time   EKG:  EKG is not ordered today.   Echo 04/29/18: Study Conclusions  - Left ventricle: The cavity size was normal. Wall thickness was   increased in a pattern of mild LVH. Systolic function was normal.   The estimated ejection fraction was in the range of 60% to 65%.   Wall motion was normal; there were no regional wall motion   abnormalities. Features are consistent with a pseudonormal left   ventricular filling pattern, with concomitant abnormal relaxation   and increased filling pressure (grade 2  diastolic dysfunction). - Pulmonary arteries: Systolic pressure was mildly increased. PA   peak pressure: 39 mm Hg (S). - Pericardium, extracardiac: A trivial pericardial effusion was   identified.   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  She is undergoing work up for this.  Only a trivial pericardial effusion was noted on exam.  I encouraged her to continue taking colchicine for the entire 3 months to help prevent recurrence.  She can continue to take ibuprofen for this month.  Agree with her stopping oxycodone.  Current medicines are reviewed at length with the patient today.  The patient does not have concerns regarding medicines.  The  following changes have been made:  Stop oxycodone  Labs/ tests ordered today include:  No orders of the defined types were placed in this encounter.    Disposition:   FU with Leonarda Leis C. Duke Salvia, MD, [facility] in 3 months.      Signed, Bernetta Sutley C. Duke Salvia, MD, [facility]  05/06/2018 1:39 PM    [facility]

## 2018-05-06 NOTE — Patient Instructions (Signed)
Medication Instructions:  Your physician recommends that you continue on your current medications as directed. Please refer to the Current Medication list given to you today.  Labwork: none  Testing/Procedures: none  Follow-Up: Your physician recommends that you schedule a follow-up appointment in: 3 months   If you need a refill on your cardiac medications before your next appointment, please call your pharmacy.

## 2018-05-10 ENCOUNTER — Ambulatory Visit: Payer: BLUE CROSS/BLUE SHIELD | Admitting: Physician Assistant

## 2018-05-10 ENCOUNTER — Ambulatory Visit: Payer: BLUE CROSS/BLUE SHIELD | Admitting: Cardiovascular Disease

## 2018-05-10 ENCOUNTER — Encounter: Payer: Self-pay | Admitting: *Deleted

## 2018-05-10 ENCOUNTER — Encounter: Payer: Self-pay | Admitting: Cardiovascular Disease

## 2018-05-10 ENCOUNTER — Telehealth: Payer: Self-pay | Admitting: Cardiovascular Disease

## 2018-05-10 VITALS — BP 110/80 | HR 80 | Ht 66.5 in | Wt 175.0 lb

## 2018-05-10 DIAGNOSIS — I32 Pericarditis in diseases classified elsewhere: Secondary | ICD-10-CM | POA: Diagnosis not present

## 2018-05-10 DIAGNOSIS — R079 Chest pain, unspecified: Secondary | ICD-10-CM | POA: Diagnosis not present

## 2018-05-10 DIAGNOSIS — M25512 Pain in left shoulder: Secondary | ICD-10-CM | POA: Diagnosis not present

## 2018-05-10 DIAGNOSIS — R05 Cough: Secondary | ICD-10-CM | POA: Diagnosis not present

## 2018-05-10 DIAGNOSIS — I309 Acute pericarditis, unspecified: Secondary | ICD-10-CM

## 2018-05-10 MED ORDER — IBUPROFEN 800 MG PO TABS: 800.0000 mg | ORAL_TABLET | Freq: Three times a day (TID) | ORAL | 1 refills | Status: DC

## 2018-05-10 MED ORDER — TRAMADOL HCL 50 MG PO TABS: 50.0000 mg | ORAL_TABLET | Freq: Three times a day (TID) | ORAL | 0 refills | Status: DC | PRN

## 2018-05-10 NOTE — Telephone Encounter (Signed)
New message  Pt c/o of Chest Pain: STAT if CP now or developed within 24 hours  1. Are you having CP right now? Yes   2. Are you experiencing any other symptoms (ex. SOB, nausea, vomiting, sweating)? Sharp pain on left shoulder, cough when take a deep breath  3. How long have you been experiencing CP? Since 05/09/2018  4. Is your CP continuous or coming and going? Coming and going with heart flutter   5. Have you taken Nitroglycerin? No  ?

## 2018-05-10 NOTE — Patient Instructions (Signed)
Medication Instructions TAKE IBUPROFEN 800 MG 3 TIMES DAY AS NEEDED FOR PAIN  TAKE TRAMADOL EVERY 8 HOURS AS NEEDED FOR PAIN   Labwork: HAVE YOUR RHEUMATOLOGIST FAX LABS TO [phone]  Testing/Procedures: Your physician has requested that you have an echocardiogram. Echocardiography is a painless test that uses sound waves to create images of your heart. It provides your doctor with information about the size and shape of your heart and how well your heart's chambers and valves are working. This procedure takes approximately one hour. There are no restrictions for this procedure. [place] [address]  Follow-Up: KEEP AS SCHEDULED   If you need a refill on your cardiac medications before your next appointment, please call your pharmacy.

## 2018-05-10 NOTE — Telephone Encounter (Signed)
Received call from patient she stated she started having chest pain and left shoulder pain yesterday off and on.Stated she continues to have pain this morning.Stated no pain at present, pain comes and goes.Stated she would like to see Dr.Blawenburg.Appointment scheduled with Dr.Her[place] Creek this morning at 11:20 am.

## 2018-05-10 NOTE — Progress Notes (Signed)
Cardiology Office Note   Date:  05/10/2018   ID:  [name], DOB [birth year], MRN [id number]  PCP:  Ileana Ladd, MD  Cardiologist:   Chilton Si, MD   Chief Complaint  Patient presents with  . Chest Pain    left shoulder   . Edema    05/09/18      History of Present Illness: [name] is a 47 y.o. female who presents for follow up.  [name] was admitted 04/2018 with chest pain.  She has a history of palpitations that she attributes to stress at work.  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  However, for the week before she went to the hospital she noted feeling more anxious.  She had no URI at the time.  She started taking magnesium powder to see if that would help.  She also thought it may be due to not exercising as much.  She started doing yoga and felt pain in her L arm that radiated into the arm and chest when bending over.  She took advil which helped.  However the following day she had persistent shortness of breath so she presented to the ED.  In the ED her EKG was initially concerning for STEMI. Troponin was elevated to 0.75 and ESR was 30.  She was thought to have myopericarditis and was started on ibuprofen and colchicine.  She was also given a prescription for oxycodone.  She followed up in clinic 9/12 and was doing better but still had some pleuritic chest pain.    [name] presents 4 days after her last appointment because of worsening chest pain.  She was starting to feel better and took her daughter to her volleyball game.  Since then she has worsening chest pain.  She is unable to lay down 2/2 chest pain and coughing.  She also has chest pain when she coughs.  She pain is a sharp, piercing pain.  She has also been experiencing palpitations which makes her have to cough.  She has no lower extremity edema, orthopnea or PND.  She has been taking ibuprofen with minimal relief.    Past Medical History:  Diagnosis Date  . Acne   .  Elevated troponin 04/28/2018  . Endometriosis   . HELLP (hemolytic anemia/elev liver enzymes/low platelets in pregnancy)    hx of two pregnancies  . Miscarriage   . Normal spontaneous vaginal delivery   . Thyroid dysfunction     Past Surgical History:  Procedure Laterality Date  . BILATERAL SALPINGECTOMY Right 10/16/2015   Procedure: RIGHT  SALPINGECTOMY;  Surgeon: Ok Edwards, MD;  Location: [facility];  Service: Gynecology;  Laterality: Right;  . BUNIONECTOMY Right 11/30/2015   @ [facility]  . BUNIONECTOMY Left 12/21/2015   @ [facility]  . BUNIONECTOMY Left 03/07/2016   @[facility]    . CESAREAN SECTION    . CYSTOSCOPY N/A 10/16/2015   Procedure: CYSTOSCOPY;  Surgeon: Ok Edwards, MD;  Location: [facility];  Service: Gynecology;  Laterality: N/A;  . DILATION AND CURETTAGE OF UTERUS    . LAPAROSCOPIC VAGINAL HYSTERECTOMY WITH SALPINGO OOPHORECTOMY Left 10/16/2015   Procedure: LAPAROSCOPIC ASSISTED VAGINAL HYSTERECTOMY WITH SALPINGO OOPHORECTOMY;  Surgeon: Ok Edwards, MD;  Location: [facility];  Service: Gynecology;  Laterality: Left;  . TUBAL LIGATION       Current Outpatient Medications  Medication Sig Dispense Refill  . BL CALCIUM-MAGNESIUM-ZINC PO Take 1 tablet by mouth daily.    Marland Kitchen  Calcium Carb-Cholecalciferol (CALCIUM 1000 + D PO) Take 1 tablet by mouth daily.    . cetirizine (ZYRTEC) 10 MG tablet Take 10 mg by mouth daily as needed for allergies.     . cholecalciferol (VITAMIN D) 1000 units tablet Take 1,000 Units by mouth daily.    . clindamycin (CLEOCIN T) 1 % lotion Apply 1 application topically daily as needed (acne). Reported on 01/11/2016    . Colchicine (MITIGARE) 0.6 MG CAPS Take 0.6 mg by mouth 2 (two) times daily. Decrease to once daily for diarrhea. 60 capsule 3  . Cyanocobalamin (VITAMIN B-12 PO) Take 1 tablet by mouth daily.    . famotidine (PEPCID) 20 MG tablet Take 1 tablet (20 mg total) by mouth 2 (two) times daily. 30 tablet 0  . ibuprofen (ADVIL,MOTRIN) 800 MG tablet Take 1 tablet  (800 mg total) by mouth 3 (three) times daily with meals. 30 tablet 0  . Multiple Vitamin (MULTIVITAMIN WITH MINERALS) TABS tablet Take 1 tablet by mouth daily.     No current facility-administered medications for this visit.     Allergies:   Iodine and Shellfish allergy    Social History:  The patient  reports that she has never smoked. She has never used smokeless tobacco. She reports that she drinks alcohol. She reports that she does not use drugs.   Family History:  [name] family history includes Cancer in her mother; Cataracts in her father; Diabetes in her brother, maternal grandmother, and mother; Heart disease in her maternal grandfather; Heart failure in her maternal grandfather; Hypertension in her mother; Kidney failure in her maternal grandmother; Lymphoma in her father; Sickle cell anemia in her maternal aunt; Stroke in her maternal grandfather; Thyroid disease in her mother.    ROS:  Please see the history of present illness.   Otherwise, review of systems are positive for none.   All other systems are reviewed and negative.    PHYSICAL EXAM: VS:  BP 110/80   Pulse 80   Ht 5' 6.5" (1.689 m)   Wt 175 lb (79.4 kg)   LMP 09/11/2015   BMI 27.82 kg/m  , BMI Body mass index is 27.82 kg/m. GENERAL:  Well appearing HEENT: Pupils equal round and reactive, fundi not visualized, oral mucosa unremarkable NECK:  No jugular venous distention, waveform within normal limits, carotid upstroke brisk and symmetric, no bruits LUNGS:  Clear to auscultation bilaterally HEART:  RRR.  PMI not displaced or sustained,S1 and S2 within normal limits, no S3, no S4, no clicks, no rubs, no murmurs ABD:  Flat, positive bowel sounds normal in frequency in pitch, no bruits, no rebound, no guarding, no midline pulsatile mass, no hepatomegaly, no splenomegaly EXT:  2 plus pulses throughout, no edema, no cyanosis no clubbing SKIN:  No rashes no nodules NEURO:  Cranial nerves II through XII grossly  intact, motor grossly intact throughout PSYCH:  Cognitively intact, oriented to person place and time   EKG:  EKG is ordered today. 05/10/18: Sinus rhythm.  Rate 80 bpm.  Mind inferior, anterior and lateral ST elevation.  PVC  Echo 04/29/18: Study Conclusions  - Left ventricle: The cavity size was normal. Wall thickness was   increased in a pattern of mild LVH. Systolic function was normal.   The estimated ejection fraction was in the range of 60% to 65%.   Wall motion was normal; there were no regional wall motion   abnormalities. Features are consistent with a pseudonormal left   ventricular filling  pattern, with concomitant abnormal relaxation   and increased filling pressure (grade 2 diastolic dysfunction). - Pulmonary arteries: Systolic pressure was mildly increased. PA   peak pressure: 39 mm Hg (S). - Pericardium, extracardiac: A trivial pericardial effusion was   identified.   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  She is undergoing work up for this.  Only a trivial pericardial effusion was noted on exam.  Her symptoms seem to be getting worse.  We will get an echo to make sure she doesn't have an enlarging pericardial effusion.  I suspect she may have just overdone it.  Continue colchicine and ibuprofen.  She has been taking it as needed and not scheduled.  We will also give her tramadol prn.      Current medicines are reviewed at length with the patient today.  The patient does not have concerns regarding medicines.  The following changes have been made:  Tramadol  Labs/ tests ordered today include:  No orders of the defined types were placed in this encounter.    Disposition:   FU with [place] Marchiano C. Duke Salvia, MD, [facility] as scheduled.    Signed, Azaryah Oleksy C. Duke Salvia, MD, [facility]  05/10/2018 11:55 AM    [facility]

## 2018-05-14 ENCOUNTER — Ambulatory Visit (HOSPITAL_COMMUNITY): Payer: BLUE CROSS/BLUE SHIELD | Attending: Cardiology

## 2018-05-14 ENCOUNTER — Other Ambulatory Visit: Payer: Self-pay

## 2018-05-14 DIAGNOSIS — I071 Rheumatic tricuspid insufficiency: Secondary | ICD-10-CM | POA: Insufficient documentation

## 2018-05-14 DIAGNOSIS — R079 Chest pain, unspecified: Secondary | ICD-10-CM | POA: Diagnosis not present

## 2018-05-14 DIAGNOSIS — I309 Acute pericarditis, unspecified: Secondary | ICD-10-CM | POA: Diagnosis not present

## 2018-05-14 DIAGNOSIS — J90 Pleural effusion, not elsewhere classified: Secondary | ICD-10-CM | POA: Diagnosis not present

## 2018-05-14 DIAGNOSIS — R0600 Dyspnea, unspecified: Secondary | ICD-10-CM | POA: Insufficient documentation

## 2018-05-15 ENCOUNTER — Encounter: Payer: Self-pay | Admitting: Cardiovascular Disease

## 2018-05-18 DIAGNOSIS — L700 Acne vulgaris: Secondary | ICD-10-CM | POA: Diagnosis not present

## 2018-05-18 DIAGNOSIS — I309 Acute pericarditis, unspecified: Secondary | ICD-10-CM | POA: Diagnosis not present

## 2018-05-18 DIAGNOSIS — R05 Cough: Secondary | ICD-10-CM | POA: Diagnosis not present

## 2018-06-02 DIAGNOSIS — M25512 Pain in left shoulder: Secondary | ICD-10-CM | POA: Diagnosis not present

## 2018-06-02 DIAGNOSIS — R05 Cough: Secondary | ICD-10-CM | POA: Diagnosis not present

## 2018-06-02 DIAGNOSIS — I32 Pericarditis in diseases classified elsewhere: Secondary | ICD-10-CM | POA: Diagnosis not present

## 2018-06-02 DIAGNOSIS — R5383 Other fatigue: Secondary | ICD-10-CM | POA: Diagnosis not present

## 2018-06-04 ENCOUNTER — Telehealth: Payer: Self-pay | Admitting: Cardiovascular Disease

## 2018-06-15 DIAGNOSIS — I309 Acute pericarditis, unspecified: Secondary | ICD-10-CM | POA: Diagnosis not present

## 2018-06-15 DIAGNOSIS — L700 Acne vulgaris: Secondary | ICD-10-CM | POA: Diagnosis not present

## 2018-06-29 DIAGNOSIS — Z23 Encounter for immunization: Secondary | ICD-10-CM | POA: Diagnosis not present

## 2018-06-29 DIAGNOSIS — R05 Cough: Secondary | ICD-10-CM | POA: Diagnosis not present

## 2018-06-29 DIAGNOSIS — R5383 Other fatigue: Secondary | ICD-10-CM | POA: Diagnosis not present

## 2018-06-29 DIAGNOSIS — I32 Pericarditis in diseases classified elsewhere: Secondary | ICD-10-CM | POA: Diagnosis not present

## 2018-06-29 DIAGNOSIS — M25512 Pain in left shoulder: Secondary | ICD-10-CM | POA: Diagnosis not present

## 2018-07-27 ENCOUNTER — Ambulatory Visit: Payer: BLUE CROSS/BLUE SHIELD | Admitting: Cardiovascular Disease

## 2018-07-27 ENCOUNTER — Encounter: Payer: Self-pay | Admitting: Cardiovascular Disease

## 2018-07-27 VITALS — BP 122/80 | HR 65 | Ht 66.5 in | Wt 172.4 lb

## 2018-07-27 DIAGNOSIS — R002 Palpitations: Secondary | ICD-10-CM | POA: Diagnosis not present

## 2018-07-27 DIAGNOSIS — I309 Acute pericarditis, unspecified: Secondary | ICD-10-CM | POA: Diagnosis not present

## 2018-07-27 MED ORDER — METOPROLOL TARTRATE 25 MG PO TABS: ORAL_TABLET | ORAL | 3 refills | Status: DC

## 2018-07-27 NOTE — Progress Notes (Signed)
Cardiology Office Note   Date:  07/27/2018   ID:  [name], DOB 1970-10-28, MRN [id number]  PCP:  Ileana Ladd, MD  Cardiologist:   Chilton Si, MD   No chief complaint on file.     History of Present Illness: [name] is a 47 y.o. female with pericarditis who presents for follow up.  [name] was admitted 04/2018 with chest pain.  She has a history of palpitations that she attributes to stress at work.  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  However, for the week before she went to the hospital she noted feeling more anxious.  She had no URI at the time.  She was seen in the emergency department where EKG was initially concerning for STEMI.  Troponin was elevated to 0.75 and ESR was 30.  She was thought to have myopericarditis and was started on ibuprofen and colchicine.  She was also given a prescription for oxycodone.  She followed up in clinic 9/12 and was doing better but still had some pleuritic chest pain.  She started to become more active but developed recurrent chest pain.  Since her last appointment she had a repeat echocardiogram 05/14/2018 that revealed LVEF 60 to 65% with no pericardial effusion.  She did have a left pleural effusion that was unchanged from the hospital.  She followed up with her rheumatologist, Dr. Zenovia Jordan, 04/2018.  She was noted to have a mildly positive rheumatoid factor, ESR, and CRP.  She tried tapering her ibuprofen and had recurrent pain after decreasing to twice a day.  It was decided that she would resume both ibuprofen and colchicine for the holidays.  However since that time she has stopped all medications.  Generally she has felt well.  However she does note palpitations when she tries to workout in the gym.  She has no chest pain or pressure with exertion.  She does note that she gets some chest pain and left shoulder pain when she is stressed at work.  She has no lower extremity edema, orthopnea, or  PND.   Past Medical History:  Diagnosis Date  . Acne   . Elevated troponin 04/28/2018  . Endometriosis   . HELLP (hemolytic anemia/elev liver enzymes/low platelets in pregnancy)    hx of two pregnancies  . Miscarriage   . Normal spontaneous vaginal delivery   . Thyroid dysfunction     Past Surgical History:  Procedure Laterality Date  . BILATERAL SALPINGECTOMY Right 10/16/2015   Procedure: RIGHT  SALPINGECTOMY;  Surgeon: Ok Edwards, MD;  Location: [facility];  Service: Gynecology;  Laterality: Right;  . BUNIONECTOMY Right 11/30/2015   @ [facility]  . BUNIONECTOMY Left 12/21/2015   @ [facility]  . BUNIONECTOMY Left 03/07/2016   @[facility]    . CESAREAN SECTION    . CYSTOSCOPY N/A 10/16/2015   Procedure: CYSTOSCOPY;  Surgeon: Ok Edwards, MD;  Location: [facility];  Service: Gynecology;  Laterality: N/A;  . DILATION AND CURETTAGE OF UTERUS    . LAPAROSCOPIC VAGINAL HYSTERECTOMY WITH SALPINGO OOPHORECTOMY Left 10/16/2015   Procedure: LAPAROSCOPIC ASSISTED VAGINAL HYSTERECTOMY WITH SALPINGO OOPHORECTOMY;  Surgeon: Ok Edwards, MD;  Location: [facility];  Service: Gynecology;  Laterality: Left;  . TUBAL LIGATION       Current Outpatient Medications  Medication Sig Dispense Refill  . BL CALCIUM-MAGNESIUM-ZINC PO Take 1 tablet by mouth daily.    . Calcium Carb-Cholecalciferol (CALCIUM 1000 + D PO) Take 1  tablet by mouth daily.    . cetirizine (ZYRTEC) 10 MG tablet Take 10 mg by mouth daily as needed for allergies.     . cholecalciferol (VITAMIN D) 1000 units tablet Take 1,000 Units by mouth daily.    . clindamycin (CLEOCIN T) 1 % lotion Apply 1 application topically daily as needed (acne). Reported on 01/11/2016    . colchicine 0.6 MG tablet Take 0.6 mg by mouth 2 (two) times daily.    . Cyanocobalamin (VITAMIN B-12 PO) Take 1 tablet by mouth daily.    . Multiple Vitamin (MULTIVITAMIN WITH MINERALS) TABS tablet Take 1 tablet by mouth daily.    . metoprolol tartrate (LOPRESSOR) 25 MG tablet 1/2 TO 1 TABLET  TWICE A DAY AS NEEDED FOR PALPITATIONS 60 tablet 3   No current facility-administered medications for this visit.     Allergies:   Iodine and Shellfish allergy    Social History:  The patient  reports that she has never smoked. She has never used smokeless tobacco. She reports that she drinks alcohol. She reports that she does not use drugs.   Family History:  [name] family history includes Cancer in her mother; Cataracts in her father; Diabetes in her brother, maternal grandmother, and mother; Heart disease in her maternal grandfather; Heart failure in her maternal grandfather; Hypertension in her mother; Kidney failure in her maternal grandmother; Lymphoma in her father; Sickle cell anemia in her maternal aunt; Stroke in her maternal grandfather; Thyroid disease in her mother.    ROS:  Please see the history of present illness.   Otherwise, review of systems are positive for none.   All other systems are reviewed and negative.    PHYSICAL EXAM: VS:  BP 122/80   Pulse 65   Ht 5' 6.5" (1.689 m)   Wt 172 lb 6.4 oz (78.2 kg)   LMP 09/11/2015   BMI 27.41 kg/m  , BMI Body mass index is 27.41 kg/m. GENERAL:  Well appearing HEENT: Pupils equal round and reactive, fundi not visualized, oral mucosa unremarkable NECK:  No jugular venous distention, waveform within normal limits, carotid upstroke brisk and symmetric, no bruits LUNGS:  Clear to auscultation bilaterally HEART:  RRR.  PMI not displaced or sustained,S1 and S2 within normal limits, no S3, no S4, no clicks, no rubs, no murmurs ABD:  Flat, positive bowel sounds normal in frequency in pitch, no bruits, no rebound, no guarding, no midline pulsatile mass, no hepatomegaly, no splenomegaly EXT:  2 plus pulses throughout, no edema, no cyanosis no clubbing SKIN:  No rashes no nodules NEURO:  Cranial nerves II through XII grossly intact, motor grossly intact throughout PSYCH:  Cognitively intact, oriented to person place and  time   EKG:  EKG is not ordered today. 05/10/18: Sinus rhythm.  Rate 80 bpm.  Mind inferior, anterior and lateral ST elevation.  PVC  Echo 04/29/18: Study Conclusions  - Left ventricle: The cavity size was normal. Wall thickness was   increased in a pattern of mild LVH. Systolic function was normal.   The estimated ejection fraction was in the range of 60% to 65%.   Wall motion was normal; there were no regional wall motion   abnormalities. Features are consistent with a pseudonormal left   ventricular filling pattern, with concomitant abnormal relaxation   and increased filling pressure (grade 2 diastolic dysfunction). - Pulmonary arteries: Systolic pressure was mildly increased. PA   peak pressure: 39 mm Hg (S). - Pericardium, extracardiac: A trivial pericardial  effusion was   identified.  Echo 05/14/18: Study Conclusions  - Left ventricle: The cavity size was normal. There was mild   concentric hypertrophy. Systolic function was normal. The   estimated ejection fraction was in the range of 60% to 65%. Wall   motion was normal; there were no regional wall motion   abnormalities. Left ventricular diastolic function parameters   were normal. - Aortic valve: There was no regurgitation. - Left atrium: The atrium was normal in size. - Right ventricle: Systolic function was normal. - Right atrium: The atrium was normal in size. - Tricuspid valve: There was mild regurgitation. - Pulmonary arteries: Systolic pressure was within the normal   range. - Inferior vena cava: The vessel was normal in size. - Pericardium, extracardiac: There was no pericardial effusion.   There was a left pleural effusion.  Impressions:  - There is no significant change since the prior study on 04/28/2018,   there is no pericardial effusion and stable left pleural   effusion.  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  ESR, CRP PR3, and RF were positive.  She continues to have some mild symptoms after stopping both colchicine and ibuprofen.  She will restart colchicine 0.6 mg twice daily for 2 months.  She can take ibuprofen as needed.  I will also give her a prescription for metoprolol to help with her palpitations.  Exercise seems to be a big trigger.  She will try taking metoprolol 12.5 to 25 mg 30 minutes prior to exercise.  There was no residual pericardial effusion on echo.   Current medicines are reviewed at length with the patient today.  The patient does not have concerns regarding medicines.  The following changes have been made:  Resume colchicine.  Metoprolol prn.  Labs/ tests ordered today include:  No orders of the defined types were placed in this encounter.    Disposition:   FU with Derran Sear C. Duke Salvia, MD, [facility] in 6 months.     Signed, Merrel Crabbe C. Duke Salvia, MD, [facility]  07/27/2018 1:38 PM    [facility]

## 2018-07-27 NOTE — Patient Instructions (Signed)
Medication Instructions:  START METOPROLOL TART 25 MG 1/2-1 TABLET TWICE A DAY AS NEEDED FOR PALPITATIONS   RESUME COLCHICINE TWICE A DAY FOR THE NEXT COUPLE OF MONTHS   USE IBUPROFEN AS NEEDED   If you need a refill on your cardiac medications before your next appointment, please call your pharmacy.   Lab work: NONE   Testing/Procedures: NONE  Follow-Up: At [facility], you and your health needs are our priority.  As part of our continuing mission to provide you with exceptional heart care, we have created designated Provider Care Teams.  These Care Teams include your primary Cardiologist (physician) and Advanced Practice Providers (APPs -  Physician Assistants and Nurse Practitioners) who all work together to provide you with the care you need, when you need it. You will need a follow up appointment in 6 months.  Please call our office 2 months in advance to schedule this appointment.  You may see DR [facility]  or one of the following Advanced Practice Providers on your designated Care Team:   Kerin Ransom, PA-C Roby Lofts, [place] . Sande Rives, PA-C

## 2018-09-01 ENCOUNTER — Other Ambulatory Visit (HOSPITAL_COMMUNITY): Payer: Self-pay | Admitting: Physician Assistant

## 2018-09-01 NOTE — Telephone Encounter (Signed)
This is Dr. Piatt's pt.  °

## 2018-09-07 DIAGNOSIS — M25512 Pain in left shoulder: Secondary | ICD-10-CM | POA: Diagnosis not present

## 2018-09-07 DIAGNOSIS — I32 Pericarditis in diseases classified elsewhere: Secondary | ICD-10-CM | POA: Diagnosis not present

## 2018-09-07 DIAGNOSIS — R768 Other specified abnormal immunological findings in serum: Secondary | ICD-10-CM | POA: Diagnosis not present

## 2018-09-22 DIAGNOSIS — L700 Acne vulgaris: Secondary | ICD-10-CM | POA: Diagnosis not present

## 2018-10-15 DIAGNOSIS — I309 Acute pericarditis, unspecified: Secondary | ICD-10-CM | POA: Diagnosis not present

## 2018-10-15 DIAGNOSIS — F4320 Adjustment disorder, unspecified: Secondary | ICD-10-CM | POA: Diagnosis not present

## 2018-10-15 DIAGNOSIS — R0789 Other chest pain: Secondary | ICD-10-CM | POA: Diagnosis not present

## 2018-10-19 ENCOUNTER — Telehealth: Payer: Self-pay | Admitting: Cardiovascular Disease

## 2018-10-19 NOTE — Telephone Encounter (Incomplete)
No need for message, patient was in dentist chair           [place] Pre-operative Risk Assessment    Request for surgical clearance:  1. What type of surgery is being performed?   2. When is this surgery scheduled? Now   3. What type of clearance is required (medical clearance vs. Pharmacy clearance to hold med vs. Both)? ***  4. Are there any medications that need to be held prior to surgery and how long?***   5. Practice name and name of physician performing surgery? ***   6. What is your office phone number***    7.   What is your office fax number***  8.   Anesthesia type (None, local, MAC, general) ? ***   [name] 10/19/2018, 4:40 PM  _________________________________________________________________   (provider comments below)

## 2018-10-19 NOTE — Telephone Encounter (Signed)
  Patient was told by insurance company that she has myocarditis and anemia which they got from our records. She is trying to get life insurance and they are denying her due to this. She was not aware that she had either of these conditions and would like the nurse to call her and explain to her if she has these conditions

## 2018-10-19 NOTE — Telephone Encounter (Signed)
Spoke with patient and she will contact her PCP regarding the follow up on anemia. Discussed the diagnosis from hospital. She was needing follow up visit scheduled and preferred follow up sooner than suggested at last visit. Scheduled visit with Dr Oval Linsey this week.

## 2018-10-22 ENCOUNTER — Ambulatory Visit: Payer: BLUE CROSS/BLUE SHIELD | Admitting: Cardiovascular Disease

## 2018-10-22 ENCOUNTER — Encounter: Payer: Self-pay | Admitting: Cardiovascular Disease

## 2018-10-22 VITALS — BP 112/72 | HR 55 | Ht 67.0 in | Wt 174.6 lb

## 2018-10-22 DIAGNOSIS — R5383 Other fatigue: Secondary | ICD-10-CM

## 2018-10-22 DIAGNOSIS — I319 Disease of pericardium, unspecified: Secondary | ICD-10-CM | POA: Diagnosis not present

## 2018-10-22 MED ORDER — NEBIVOLOL HCL 2.5 MG PO TABS: 2.5000 mg | ORAL_TABLET | Freq: Every day | ORAL | 1 refills | Status: DC

## 2018-10-22 NOTE — Patient Instructions (Addendum)
Medication Instructions:  STOP METOPROLOL   START BYSTOLIC 2.5 MG DAILY  If you need a refill on your cardiac medications before your next appointment, please call your pharmacy.   Lab work: BMET/CBC TODAY  If you have labs (blood work) drawn today and your tests are completely normal, you will receive your results only by: Marland Kitchen MyChart Message (if you have MyChart) OR . A paper copy in the mail If you have any lab test that is abnormal or we need to change your treatment, we will call you to review the results.  Testing/Procedures: NONE  Follow-Up KEEP MAY APPOINTMENT

## 2018-10-22 NOTE — Progress Notes (Signed)
Cardiology Office Note   Date:  10/22/2018   ID:  [name], DOB [birth year], MRN [id number]  PCP:  Ileana Ladd, MD  Cardiologist:   Chilton Si, MD   No chief complaint on file.     History of Present Illness: [name] is a 49 y.o. female with pericarditis who presents for follow up.  [name] was admitted 04/2018 with chest pain.  She has a history of palpitations that she attributes to stress at work.  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  However, for the week before she went to the hospital she noted feeling more anxious.  She had no URI at the time.  She was seen in the emergency department where EKG was initially concerning for STEMI.  Troponin was elevated to 0.75 and ESR was 30.  She was thought to have myopericarditis and was started on ibuprofen and colchicine.  She was also given a prescription for oxycodone.  She followed up in clinic 9/12 and was doing better but still had some pleuritic chest pain.  She started to become more active but developed recurrent chest pain.  Since her last appointment she had a repeat echocardiogram 05/14/2018 that revealed LVEF 60 to 65% with no pericardial effusion.  She did have a left pleural effusion that was unchanged from the hospital.  She followed up with her rheumatologist, Dr. Zenovia Jordan, 04/2018.  She was noted to have a mildly positive rheumatoid factor, ESR, and CRP.  She tried tapering her ibuprofen and had recurrent pain after decreasing to twice a day.  It was decided that she would resume both ibuprofen and colchicine for the holidays.  However since that time she has stopped all medications.    At her last appointment [name] had persistent chest pain.  She restarted colchicine and ibuprofen.  She also started metoprolol for palpitations.  She was feeling much better.  However two weeks ago her father passed away from leukemia.  She felt chest tightness while planning the funeral.  She  also feels as though she has been holding her breath and she feels tired when she tries to breathe.  She notes that her symptoms are worse with exertion.  She has no lower extremity edema, orthopnea or PND.  She reports feeling generally fatigued since starting metoprolol but her palpitations are much better controlled..  She saw her rheumatologist who does not think that she has RA.   Past Medical History:  Diagnosis Date  . Acne   . Elevated troponin 04/28/2018  . Endometriosis   . HELLP (hemolytic anemia/elev liver enzymes/low platelets in pregnancy)    hx of two pregnancies  . Miscarriage   . Normal spontaneous vaginal delivery   . Thyroid dysfunction     Past Surgical History:  Procedure Laterality Date  . BILATERAL SALPINGECTOMY Right 10/16/2015   Procedure: RIGHT  SALPINGECTOMY;  Surgeon: Ok Edwards, MD;  Location: [facility];  Service: Gynecology;  Laterality: Right;  . BUNIONECTOMY Right 11/30/2015   @ [facility]  . BUNIONECTOMY Left 12/21/2015   @ [facility]  . BUNIONECTOMY Left 03/07/2016   @[facility]    . CESAREAN SECTION    . CYSTOSCOPY N/A 10/16/2015   Procedure: CYSTOSCOPY;  Surgeon: Ok Edwards, MD;  Location: [facility];  Service: Gynecology;  Laterality: N/A;  . DILATION AND CURETTAGE OF UTERUS    . LAPAROSCOPIC VAGINAL HYSTERECTOMY WITH SALPINGO OOPHORECTOMY Left 10/16/2015   Procedure: LAPAROSCOPIC ASSISTED VAGINAL  HYSTERECTOMY WITH SALPINGO OOPHORECTOMY;  Surgeon: Ok Edwards, MD;  Location: [facility];  Service: Gynecology;  Laterality: Left;  . TUBAL LIGATION       Current Outpatient Medications  Medication Sig Dispense Refill  . cetirizine (ZYRTEC) 10 MG tablet Take 10 mg by mouth daily as needed for allergies.     . Cholecalciferol (VITAMIN D3) 50 MCG (2000 UT) TABS Take by mouth.    . clindamycin-benzoyl peroxide (BENZACLIN) gel APPLY TO AFFECTED AREAS ON THE FACE QAM    . colchicine 0.6 MG tablet Take 0.6 mg by mouth daily.     . Cyanocobalamin (VITAMIN B-12 PO) Take 1  tablet by mouth daily.    . Ginkgo Biloba 120 MG TABS Take 2 tablets by mouth daily.    . metoprolol tartrate (LOPRESSOR) 25 MG tablet 1/2 TO 1 TABLET TWICE A DAY AS NEEDED FOR PALPITATIONS 60 tablet 3  . Multiple Vitamin (MULTIVITAMIN WITH MINERALS) TABS tablet Take 1 tablet by mouth daily.    Marland Kitchen spironolactone (ALDACTONE) 100 MG tablet TK 1 T PO D    . tretinoin (RETIN-A) 0.1 % cream APPLY A THIN COAT TO AFFECTED AREAS 3 NIGHTS A WEEK THEN NIGHTLY AS TOLERATED     No current facility-administered medications for this visit.     Allergies:   Iodinated diagnostic agents; Iodine; Other; and Shellfish allergy    Social History:  The patient  reports that she has never smoked. She has never used smokeless tobacco. She reports current alcohol use. She reports that she does not use drugs.   Family History:  [name] family history includes Cancer in her mother; Cataracts in her father; Diabetes in her brother, maternal grandmother, and mother; Heart disease in her maternal grandfather; Heart failure in her maternal grandfather; Hypertension in her mother; Kidney failure in her maternal grandmother; Lymphoma in her father; Sickle cell anemia in her maternal aunt; Stroke in her maternal grandfather; Thyroid disease in her mother.    ROS:  Please see the history of present illness.   Otherwise, review of systems are positive for none.   All other systems are reviewed and negative.    PHYSICAL EXAM: VS:  BP 112/72   Pulse (!) 55   Ht 5\' 7"  (1.702 m)   Wt 174 lb 9.6 oz (79.2 kg)   LMP 09/11/2015   SpO2 98%   BMI 27.35 kg/m  , BMI Body mass index is 27.35 kg/m. GENERAL:  Well appearing HEENT: Pupils equal round and reactive, fundi not visualized, oral mucosa unremarkable NECK:  No jugular venous distention, waveform within normal limits, carotid upstroke brisk and symmetric, no bruits LUNGS:  Clear to auscultation bilaterally HEART:  RRR.  PMI not displaced or sustained,S1 and S2 within  normal limits, no S3, no S4, no clicks, no rubs, no murmurs ABD:  Flat, positive bowel sounds normal in frequency in pitch, no bruits, no rebound, no guarding, no midline pulsatile mass, no hepatomegaly, no splenomegaly EXT:  2 plus pulses throughout, no edema, no cyanosis no clubbing SKIN:  No rashes no nodules NEURO:  Cranial nerves II through XII grossly intact, motor grossly intact throughout PSYCH:  Cognitively intact, oriented to person place and time    EKG:  EKG is not ordered today. 05/10/18: Sinus rhythm.  Rate 80 bpm.  Mind inferior, anterior and lateral ST elevation.  PVC  Echo 04/29/18: Study Conclusions  - Left ventricle: The cavity size was normal. Wall thickness was   increased in a  pattern of mild LVH. Systolic function was normal.   The estimated ejection fraction was in the range of 60% to 65%.   Wall motion was normal; there were no regional wall motion   abnormalities. Features are consistent with a pseudonormal left   ventricular filling pattern, with concomitant abnormal relaxation   and increased filling pressure (grade 2 diastolic dysfunction). - Pulmonary arteries: Systolic pressure was mildly increased. PA   peak pressure: 39 mm Hg (S). - Pericardium, extracardiac: A trivial pericardial effusion was   identified.  Echo 05/14/18: Study Conclusions  - Left ventricle: The cavity size was normal. There was mild   concentric hypertrophy. Systolic function was normal. The   estimated ejection fraction was in the range of 60% to 65%. Wall   motion was normal; there were no regional wall motion   abnormalities. Left ventricular diastolic function parameters   were normal. - Aortic valve: There was no regurgitation. - Left atrium: The atrium was normal in size. - Right ventricle: Systolic function was normal. - Right atrium: The atrium was normal in size. - Tricuspid valve: There was mild regurgitation. - Pulmonary arteries: Systolic pressure was within the  normal   range. - Inferior vena cava: The vessel was normal in size. - Pericardium, extracardiac: There was no pericardial effusion.   There was a left pleural effusion.  Impressions:  - There is no significant change since the prior study on 04/28/2018,   there is no pericardial effusion and stable left pleural   effusion.  Recent Labs: 11/26/2017: ALT 14 04/28/2018: TSH 0.478 04/29/2018: BUN 12; Creatinine, Ser 0.84; Potassium 4.4; Sodium 138 04/30/2018: Hemoglobin 10.1; Platelets 188    Lipid Panel    Component Value Date/Time   CHOL 163 04/28/2018 0047   TRIG 38 04/28/2018 0047   HDL 77 04/28/2018 0047   CHOLHDL 2.1 04/28/2018 0047   VLDL 8 04/28/2018 0047   LDLCALC 78 04/28/2018 0047   LDLCALC 100 (H) 11/26/2017 0843      Wt Readings from Last 3 Encounters:  10/22/18 174 lb 9.6 oz (79.2 kg)  07/27/18 172 lb 6.4 oz (78.2 kg)  05/10/18 175 lb (79.4 kg)      ASSESSMENT AND PLAN:  # Myopericarditis:  # Fatigue: [name] has symptoms of fatigue and shortness of breath.  Her chest pain has subsided.  She is more tired on metoprolol so we will switch to nebivolol 2.5mg  daily.  There was no residual pericardial effusion on echo.  Colchicine can be stopped.  She was also anemic so we will check a CBC.  She was started on spironolactone for acne.  Will check a BMP.    Current medicines are reviewed at length with the patient today.  The patient does not have concerns regarding medicines.  The following changes have been made:  none  Labs/ tests ordered today include:  No orders of the defined types were placed in this encounter.    Disposition:   FU with Raeli Wiens C. Duke Salvia, MD, [facility] in May as scheduled.   Signed, Jniyah Dantuono C. Duke Salvia, MD, [facility]  10/22/2018 3:30 PM    [facility]

## 2018-10-23 LAB — CBC WITH DIFFERENTIAL/PLATELET
Basophils Absolute: 0.1 10*3/uL (ref 0.0–0.2)
Basos: 1 %
EOS (ABSOLUTE): 0.2 10*3/uL (ref 0.0–0.4)
Eos: 3 %
Hematocrit: 43.8 % (ref 34.0–46.6)
Hemoglobin: 14.7 g/dL (ref 11.1–15.9)
IMMATURE GRANS (ABS): 0 10*3/uL (ref 0.0–0.1)
Immature Granulocytes: 0 %
LYMPHS: 42 %
Lymphocytes Absolute: 3.4 10*3/uL — ABNORMAL HIGH (ref 0.7–3.1)
MCH: 31.3 pg (ref 26.6–33.0)
MCHC: 33.6 g/dL (ref 31.5–35.7)
MCV: 93 fL (ref 79–97)
MONOCYTES: 8 %
Monocytes Absolute: 0.6 10*3/uL (ref 0.1–0.9)
Neutrophils Absolute: 3.8 10*3/uL (ref 1.4–7.0)
Neutrophils: 46 %
Platelets: 242 10*3/uL (ref 150–450)
RBC: 4.7 x10E6/uL (ref 3.77–5.28)
RDW: 12.6 % (ref 11.7–15.4)
WBC: 8.1 10*3/uL (ref 3.4–10.8)

## 2018-10-23 LAB — BASIC METABOLIC PANEL
BUN/Creatinine Ratio: 20 (ref 9–23)
BUN: 17 mg/dL (ref 6–24)
CO2: 23 mmol/L (ref 20–29)
Calcium: 9.9 mg/dL (ref 8.7–10.2)
Chloride: 100 mmol/L (ref 96–106)
Creatinine, Ser: 0.86 mg/dL (ref 0.57–1.00)
GFR calc Af Amer: 93 mL/min/{1.73_m2} (ref >59)
GFR calc non Af Amer: 81 mL/min/{1.73_m2} (ref >59)
GLUCOSE: 70 mg/dL (ref 65–99)
POTASSIUM: 3.9 mmol/L (ref 3.5–5.2)
Sodium: 139 mmol/L (ref 134–144)

## 2018-10-28 ENCOUNTER — Telehealth: Payer: Self-pay | Admitting: Cardiovascular Disease

## 2018-10-28 DIAGNOSIS — K219 Gastro-esophageal reflux disease without esophagitis: Secondary | ICD-10-CM | POA: Diagnosis not present

## 2018-10-28 DIAGNOSIS — R0989 Other specified symptoms and signs involving the circulatory and respiratory systems: Secondary | ICD-10-CM | POA: Diagnosis not present

## 2018-10-28 NOTE — Telephone Encounter (Signed)
Pt called to report that she had felt like there was a "lump" in her throat but it seemed to travel down her throat and now she is having tightness with inspiration.. she saw her ENT this morning and he reported that her Lymph nodes were fine and her lungs sounded clear but felt she could be having acid reflux and gave her Prilosec... she is still worried about it and I advise her to call her PCP.. Dr. Jacelyn Grip and reassured her that I will send the message to Dr. Oval Linsey and her nurse for their information and will call back if any recommendations. Pt agreed.

## 2018-10-28 NOTE — Telephone Encounter (Signed)
New message:     Patient calling concerning about a ball that appeared on the right side of her throat then it rolled to her to her chest Please call patient.

## 2018-10-29 DIAGNOSIS — M940 Chondrocostal junction syndrome [Tietze]: Secondary | ICD-10-CM | POA: Diagnosis not present

## 2018-10-29 DIAGNOSIS — I309 Acute pericarditis, unspecified: Secondary | ICD-10-CM | POA: Diagnosis not present

## 2018-10-29 DIAGNOSIS — R0789 Other chest pain: Secondary | ICD-10-CM | POA: Diagnosis not present

## 2018-10-29 DIAGNOSIS — R0989 Other specified symptoms and signs involving the circulatory and respiratory systems: Secondary | ICD-10-CM | POA: Diagnosis not present

## 2018-11-01 ENCOUNTER — Encounter: Payer: Self-pay | Admitting: Cardiovascular Disease

## 2018-11-01 NOTE — Telephone Encounter (Signed)
Agree.  This would not be cardiac.  Follow up with PCP.

## 2018-11-02 NOTE — Telephone Encounter (Signed)
Advised patient, stated she did follow up with PCP

## 2018-11-05 ENCOUNTER — Telehealth: Payer: Self-pay | Admitting: Cardiovascular Disease

## 2018-11-05 NOTE — Telephone Encounter (Signed)
Records received from [facility] on 11/05/18, Appt 01/11/19 @ 10:40AM.[place]

## 2018-11-10 ENCOUNTER — Other Ambulatory Visit: Payer: Self-pay | Admitting: Cardiovascular Disease

## 2018-11-10 NOTE — Telephone Encounter (Signed)
OK to refill

## 2018-11-10 NOTE — Telephone Encounter (Signed)
New Message    *STAT* If patient is at the pharmacy, call can be transferred to refill team.   1. Which medications need to be refilled? (please list name of each medication and dose if known) Colchicine   2. Which pharmacy/location (including street and city if local pharmacy) is medication to be sent to? [facility] in [place]   3. Do they need a 30 day or 90 day supply? 90 days

## 2018-11-11 DIAGNOSIS — R768 Other specified abnormal immunological findings in serum: Secondary | ICD-10-CM | POA: Diagnosis not present

## 2018-11-11 DIAGNOSIS — I32 Pericarditis in diseases classified elsewhere: Secondary | ICD-10-CM | POA: Diagnosis not present

## 2018-11-11 MED ORDER — COLCHICINE 0.6 MG PO TABS: 0.6000 mg | ORAL_TABLET | Freq: Every day | ORAL | 3 refills | Status: DC

## 2018-11-11 NOTE — Telephone Encounter (Signed)
Rx(s) sent to pharmacy electronically. Patient has been notified directly and voiced understanding.  

## 2018-12-01 ENCOUNTER — Other Ambulatory Visit: Payer: Self-pay

## 2018-12-01 ENCOUNTER — Other Ambulatory Visit: Payer: Self-pay | Admitting: Obstetrics & Gynecology

## 2018-12-01 DIAGNOSIS — Z1231 Encounter for screening mammogram for malignant neoplasm of breast: Secondary | ICD-10-CM

## 2018-12-02 ENCOUNTER — Encounter: Payer: Self-pay | Admitting: Obstetrics & Gynecology

## 2018-12-02 ENCOUNTER — Ambulatory Visit (INDEPENDENT_AMBULATORY_CARE_PROVIDER_SITE_OTHER): Payer: BLUE CROSS/BLUE SHIELD | Admitting: Obstetrics & Gynecology

## 2018-12-02 VITALS — BP 122/76 | Ht 67.0 in | Wt 178.0 lb

## 2018-12-02 DIAGNOSIS — N898 Other specified noninflammatory disorders of vagina: Secondary | ICD-10-CM | POA: Diagnosis not present

## 2018-12-02 DIAGNOSIS — Z01419 Encounter for gynecological examination (general) (routine) without abnormal findings: Secondary | ICD-10-CM | POA: Diagnosis not present

## 2018-12-02 DIAGNOSIS — Z90710 Acquired absence of both cervix and uterus: Secondary | ICD-10-CM | POA: Diagnosis not present

## 2018-12-02 DIAGNOSIS — E663 Overweight: Secondary | ICD-10-CM

## 2018-12-02 LAB — WET PREP FOR TRICH, YEAST, CLUE

## 2018-12-02 MED ORDER — FLUCONAZOLE 150 MG PO TABS: 150.0000 mg | ORAL_TABLET | Freq: Every day | ORAL | 2 refills | Status: AC

## 2018-12-02 NOTE — Progress Notes (Signed)
[name] 03/07/1971 [id number]   History:    48 y.o.  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  RP:  Established patient presenting for annual gyn exam   HPI: S/P Total Hysterectomy.  No pelvic pain.  Complains of vaginal itching and vulvar rash.  No pain with intercourse.  Urine and bowel movements normal.  Breast normal.  Body mass index 27.88.  Health labs with family physician.  Past medical history,surgical history, family history and social history were all reviewed and documented in the EPIC chart.  Gynecologic History Patient's last menstrual period was 09/11/2015. Contraception: status post hysterectomy Last Pap: 11/2017. Results were: Negative Last mammogram: 11/2017. Results were: Negative Bone Density: Never Colonoscopy: Never  Obstetric History OB History  Gravida Para Term Preterm AB Living  2 2 1 1   2   SAB TAB Ectopic Multiple Live Births          2    # Outcome Date GA Lbr Len/2nd Weight Sex Delivery Anes PTL Lv  2 Preterm     F CS-Unspec  Y LIV  1 Term     M CS-Unspec  N LIV     ROS: A ROS was performed and pertinent positives and negatives are included in the history.  GENERAL: No fevers or chills. HEENT: No change in vision, no earache, sore throat or sinus congestion. NECK: No pain or stiffness. CARDIOVASCULAR: No chest pain or pressure. No palpitations. PULMONARY: No shortness of breath, cough or wheeze. GASTROINTESTINAL: No abdominal pain, nausea, vomiting or diarrhea, melena or bright red blood per rectum. GENITOURINARY: No urinary frequency, urgency, hesitancy or dysuria. MUSCULOSKELETAL: No joint or muscle pain, no back pain, no recent trauma. DERMATOLOGIC: No rash, no itching, no lesions. ENDOCRINE: No polyuria, polydipsia, no heat or cold intolerance. No recent change in weight. HEMATOLOGICAL: No anemia or easy bruising or bleeding. NEUROLOGIC: No headache, seizures, numbness, tingling or weakness. PSYCHIATRIC:  No depression, no loss of interest in normal activity or change in sleep pattern.     Exam:   BP 122/76   Ht 5\' 7"  (1.702 m)   Wt 178 lb (80.7 kg)   LMP 09/11/2015   BMI 27.88 kg/m   Body mass index is 27.88 kg/m.  General appearance : Well developed well nourished female. No acute distress HEENT: Eyes: no retinal hemorrhage or exudates,  Neck supple, trachea midline, no carotid bruits, no thyroidmegaly Lungs: Clear to auscultation, no rhonchi or wheezes, or rib retractions  Heart: Regular rate and rhythm, no murmurs or gallops Breast:Examined in sitting and supine position were symmetrical in appearance, no palpable masses or tenderness,  no skin retraction, no nipple inversion, no nipple discharge, no skin discoloration, no axillary or supraclavicular lymphadenopathy Abdomen: no palpable masses or tenderness, no rebound or guarding Extremities: no edema or skin discoloration or tenderness  Pelvic: Vulva: Normal, no active irritation             Vagina: No gross lesions.  Increased vaginal discharge.  Wet prep done.  Cervix/Uterus absent  Adnexa  Without masses or tenderness  Anus: Normal  Wet prep:  Yeasts present   Assessment/Plan:  48 y.o. female for annual exam   1. Well female exam with routine gynecological exam Gynecologic exam status post LAVH.  Pap test April 2019 was negative, no indication to repeat this year.  Breast exam normal.  Screening mammogram April 2019-, patient will schedule a screening mammogram now.  Health labs  with family physician.  2. History of LAVH  3. Vaginal itching Yeast vaginitis present confirmed by wet prep.  Will treat with fluconazole 150 mg daily for 3 days.  Usage reviewed and prescription sent to pharmacy.  Probiotic tablet vaginally weekly to improve flora. - WET PREP FOR [place], YEAST, CLUE  4. Overweight (BMI 25.0-29.9) Recommend lower calorie/carb diet such as [employer].  Aerobic physical activities 5 times a week and  weightlifting every 2 days.  Other orders - fluconazole (DIFLUCAN) 150 MG tablet; Take 1 tablet (150 mg total) by mouth daily for 3 days.  Princess Bruins MD, 8:35 AM 12/02/2018

## 2018-12-02 NOTE — Patient Instructions (Signed)
1. Well female exam with routine gynecological exam Gynecologic exam status post LAVH.  Pap test April 2019 was negative, no indication to repeat this year.  Breast exam normal.  Screening mammogram April 2019-, patient will schedule a screening mammogram now.  Health labs with family physician.  2. History of LAVH  3. Vaginal itching Yeast vaginitis present confirmed by wet prep.  Will treat with fluconazole 150 mg daily for 3 days.  Usage reviewed and prescription sent to pharmacy.  Probiotic tablet vaginally weekly to improve flora. - WET PREP FOR [place], YEAST, CLUE  4. Overweight (BMI 25.0-29.9) Recommend lower calorie/carb diet such as [employer].  Aerobic physical activities 5 times a week and weightlifting every 2 days.  Other orders - fluconazole (DIFLUCAN) 150 MG tablet; Take 1 tablet (150 mg total) by mouth daily for 3 days.  Genella Rife, it was a pleasure seeing you today!

## 2018-12-09 DIAGNOSIS — K219 Gastro-esophageal reflux disease without esophagitis: Secondary | ICD-10-CM | POA: Diagnosis not present

## 2018-12-09 DIAGNOSIS — Z7289 Other problems related to lifestyle: Secondary | ICD-10-CM | POA: Diagnosis not present

## 2019-01-10 ENCOUNTER — Telehealth: Payer: Self-pay | Admitting: Cardiovascular Disease

## 2019-01-11 ENCOUNTER — Telehealth (INDEPENDENT_AMBULATORY_CARE_PROVIDER_SITE_OTHER): Payer: BLUE CROSS/BLUE SHIELD | Admitting: Cardiovascular Disease

## 2019-01-11 DIAGNOSIS — R071 Chest pain on breathing: Secondary | ICD-10-CM | POA: Diagnosis not present

## 2019-01-11 DIAGNOSIS — I319 Disease of pericardium, unspecified: Secondary | ICD-10-CM

## 2019-01-11 NOTE — Patient Instructions (Addendum)
Medication Instructions:  Your physician recommends that you continue on your current medications as directed. Please refer to the Current Medication list given to you today.  If you need a refill on your cardiac medications before your next appointment, please call your pharmacy.   Lab work: NONE  Testing/Procedures: NONE  Follow-Up: At [facility], you and your health needs are our priority.  As part of our continuing mission to provide you with exceptional heart care, we have created designated Provider Care Teams.  These Care Teams include your primary Cardiologist (physician) and Advanced Practice Providers (APPs -  Physician Assistants and Nurse Practitioners) who all work together to provide you with the care you need, when you need it. You will need a follow up appointment in 12 months.  Please call our office 2 months in advance to schedule this appointment.  You may see Lanetta Figuero [place], MD or one of the following Advanced Practice Providers on your designated Care Team:   Luke Kilroy, PA-C Krista Kroeger, PA-C . Callie Goodrich, PA-C    

## 2019-01-11 NOTE — Progress Notes (Signed)
Virtual Visit via Video Note   This visit type was conducted due to national recommendations for restrictions regarding the COVID-19 Pandemic (e.g. social distancing) in an effort to limit this patient's exposure and mitigate transmission in our community.  Due to her co-morbid illnesses, this patient is at least at moderate risk for complications without adequate follow up.  This format is felt to be most appropriate for this patient at this time.  All issues noted in this document were discussed and addressed.  A limited physical exam was performed with this format.  Please refer to the patient's chart for her consent to telehealth for [facility].   Date:  01/11/2019   ID:  [name], DOB [birth year], MRN [id number]  Patient Location: Home Provider Location: Office  PCP:  [name], MD  Cardiologist:  Skeet Latch, MD  Electrophysiologist:  None   Evaluation Performed:  Follow-Up Visit  Chief Complaint:    History of Present Illness:    [name] is a 48 y.o. female with pericarditis who presents for follow up.  [name] was admitted 04/2018 with chest pain.  She has a history of palpitations that she attributes to stress at work.  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  However, for the week before she went to the hospital she noted feeling more anxious.  She had no URI at the time.  She was seen in the emergency department where EKG was initially concerning for STEMI.  Troponin was elevated to 0.75 and ESR was 30.  She was thought to have myopericarditis and was started on ibuprofen and colchicine.  She was also given a prescription for oxycodone.  She followed up in clinic 9/12 and was doing better but still had some pleuritic chest pain.  She started to become more active but developed recurrent chest pain.  Since her last appointment she had a repeat echocardiogram 05/14/2018 that revealed LVEF 60 to 65% with no pericardial effusion.  She did  have a left pleural effusion that was unchanged from the hospital.  She followed up with her rheumatologist, Dr. Gavin Pound, 04/2018.  She was noted to have a mildly positive rheumatoid factor, ESR, and CRP.  She tried tapering her ibuprofen and had recurrent pain after decreasing to twice a day.  It was decided that she would resume both ibuprofen and colchicine for the holidays.  However since that time she has stopped all medications.    Since her last appointment [name] has been well.  She had an episode of discomfort in her throat that radiated down into her chest.  She saw ENT and this was thought ot be due to acid reflux. She started omeprazole and this has helped.  She takes it as needed especially when she takes ibuprofen.  She switched from metoprolol to nebivolol due to fatigue.  This has helped.  Since then she has been feeling well.  She rarely needs colchicine and hasn't experiencing any chest pain or shortness of breath.  She has not been exercising regularly but has a home gym and plans to start back soon.  Her breathing has been good.  She denies lower extremity edema, orthopnea, or PND.  She notes that when she takes nebivolol it makes her less anxious.  She has been taking this as needed.  The patient does not have symptoms concerning for COVID-19 infection (fever, chills, cough, or new shortness of breath).    Past Medical History:  Diagnosis Date   Acne    Elevated troponin 04/28/2018   Endometriosis    HELLP (hemolytic anemia/elev liver enzymes/low platelets in pregnancy)    hx of two pregnancies   Miscarriage    Normal spontaneous vaginal delivery    Thyroid dysfunction    Past Surgical History:  Procedure Laterality Date   BILATERAL SALPINGECTOMY Right 10/16/2015   Procedure: RIGHT  SALPINGECTOMY;  Surgeon: Terrance Mass, MD;  Location: [facility];  Service: Gynecology;  Laterality: Right;   BUNIONECTOMY Right 11/30/2015   @ [place]   BUNIONECTOMY Left 12/21/2015    @ [place]   BUNIONECTOMY Left 03/07/2016   '@[facility]'     CESAREAN SECTION     CYSTOSCOPY N/A 10/16/2015   Procedure: CYSTOSCOPY;  Surgeon: Terrance Mass, MD;  Location: [facility];  Service: Gynecology;  Laterality: N/A;   DILATION AND CURETTAGE OF UTERUS     LAPAROSCOPIC VAGINAL HYSTERECTOMY WITH SALPINGO OOPHORECTOMY Left 10/16/2015   Procedure: LAPAROSCOPIC ASSISTED VAGINAL HYSTERECTOMY WITH SALPINGO OOPHORECTOMY;  Surgeon: Terrance Mass, MD;  Location: [facility];  Service: Gynecology;  Laterality: Left;   TUBAL LIGATION       Current Meds  Medication Sig   Cholecalciferol (VITAMIN D3) 50 MCG (2000 UT) TABS Take by mouth.   clindamycin-benzoyl peroxide (BENZACLIN) gel APPLY TO AFFECTED AREAS ON THE FACE QAM   colchicine 0.6 MG tablet Take 1 tablet (0.6 mg total) by mouth daily.   Cyanocobalamin (VITAMIN B-12 PO) Take 1 tablet by mouth daily.   Fexofenadine-Pseudoephedrine (ALLEGRA-D 24 HOUR PO) Take by mouth.   Ginkgo Biloba 120 MG TABS Take 2 tablets by mouth daily.   Multiple Vitamin (MULTIVITAMIN WITH MINERALS) TABS tablet Take 1 tablet by mouth daily.   nebivolol (BYSTOLIC) 2.5 MG tablet Take 1 tablet (2.5 mg total) by mouth daily.   omeprazole (PRILOSEC) 40 MG capsule Take 40 mg by mouth daily.   spironolactone (ALDACTONE) 100 MG tablet TK 1 T PO D   tretinoin (RETIN-A) 0.1 % cream APPLY A THIN COAT TO AFFECTED AREAS 3 NIGHTS A WEEK THEN NIGHTLY AS TOLERATED     Allergies:   Iodinated diagnostic agents; Iodine; Other; and Shellfish allergy   Social History   Tobacco Use   Smoking status: Never Smoker   Smokeless tobacco: Never Used  Substance Use Topics   Alcohol use: Yes    Alcohol/week: 0.0 standard drinks    Comment: WINE.... OCC   Drug use: No     Family Hx: [name] family history includes Cancer in her brother and mother; Cataracts in her father; Diabetes in her brother, maternal grandmother, and mother; Heart disease in her maternal grandfather;  Heart failure in her maternal grandfather; Hypertension in her mother; Kidney failure in her maternal grandmother; Lymphoma in her father; Sickle cell anemia in her maternal aunt; Stroke in her maternal grandfather; Thyroid disease in her mother.  ROS:   Please see the history of present illness.     All other systems reviewed and are negative.   Prior CV studies:   The following studies were reviewed today:  Echo 04/29/18: Study Conclusions  - Left ventricle: The cavity size was normal. Wall thickness was increased in a pattern of mild LVH. Systolic function was normal. The estimated ejection fraction was in the range of 60% to 65%. Wall motion was normal; there were no regional wall motion abnormalities. Features are consistent with a pseudonormal left ventricular filling pattern, with concomitant abnormal relaxation and increased filling pressure (grade 2  diastolic dysfunction). - Pulmonary arteries: Systolic pressure was mildly increased. PA peak pressure: 39 mm Hg (S). - Pericardium, extracardiac: A trivial pericardial effusion was identified.  Echo 05/14/18: Study Conclusions  - Left ventricle: The cavity size was normal. There was mild concentric hypertrophy. Systolic function was normal. The estimated ejection fraction was in the range of 60% to 65%. Wall motion was normal; there were no regional wall motion abnormalities. Left ventricular diastolic function parameters were normal. - Aortic valve: There was no regurgitation. - Left atrium: The atrium was normal in size. - Right ventricle: Systolic function was normal. - Right atrium: The atrium was normal in size. - Tricuspid valve: There was mild regurgitation. - Pulmonary arteries: Systolic pressure was within the normal range. - Inferior vena cava: The vessel was normal in size. - Pericardium, extracardiac: There was no pericardial effusion. There was a left pleural  effusion.  Impressions:  - There is no significant change since the prior study on 04/28/2018, there is no pericardial effusion and stable left pleural effusion.   Labs/Other Tests and Data Reviewed:    EKG:  No ECG reviewed.  Recent Labs: 04/28/2018: TSH 0.478 10/22/2018: BUN 17; Creatinine, Ser 0.86; Hemoglobin 14.7; Platelets 242; Potassium 3.9; Sodium 139   Recent Lipid Panel Lab Results  Component Value Date/Time   CHOL 163 04/28/2018 12:47 AM   TRIG 38 04/28/2018 12:47 AM   HDL 77 04/28/2018 12:47 AM   CHOLHDL 2.1 04/28/2018 12:47 AM   LDLCALC 78 04/28/2018 12:47 AM   LDLCALC 100 (H) 11/26/2017 08:43 AM    Wt Readings from Last 3 Encounters:  01/11/19 174 lb 12.8 oz (79.3 kg)  12/02/18 178 lb (80.7 kg)  10/22/18 174 lb 9.6 oz (79.2 kg)     Objective:    Ht 5' 7.5" (1.715 m)    Wt 174 lb 12.8 oz (79.3 kg)    LMP 09/11/2015    BMI 26.97 kg/m  GENERAL: Well-appearing.  No acute distress. HEENT: Pupils equal round.  Oral mucosa unremarkable NECK:  No jugular venous distention, no visible thyromegaly EXT:  No edema, no cyanosis no clubbing SKIN:  No rashes no nodules NEURO:  Speech fluent.  Cranial nerves grossly intact.  Moves all 4 extremities freely PSYCH:  Cognitively intact, oriented to person place and time   ASSESSMENT & PLAN:    # Myopericarditis:  Resolved. Stop colchicine.  She is only taking it as needed now, which is rare.  There was no residual pericardial effusion on echo 04/2018.    # Palpitations: Improved on nebivolol.   COVID-19 Education: The signs and symptoms of COVID-19 were discussed with the patient and how to seek care for testing (follow up with PCP or arrange E-visit).  The importance of social distancing was discussed today.  Time:   Today, I have spent 17 minutes with the patient with telehealth technology discussing the above problems.     Medication Adjustments/Labs and Tests Ordered: Current medicines are reviewed at  length with the patient today.  Concerns regarding medicines are outlined above.   Tests Ordered: No orders of the defined types were placed in this encounter.   Medication Changes: No orders of the defined types were placed in this encounter.   Disposition:  Follow up in 1 year(s)  Signed, Skeet Latch, MD  01/11/2019 12:09 PM    [place]

## 2019-01-12 DIAGNOSIS — Z79899 Other long term (current) drug therapy: Secondary | ICD-10-CM | POA: Diagnosis not present

## 2019-01-12 DIAGNOSIS — L700 Acne vulgaris: Secondary | ICD-10-CM | POA: Diagnosis not present

## 2019-01-27 ENCOUNTER — Ambulatory Visit: Payer: BLUE CROSS/BLUE SHIELD

## 2019-02-14 DIAGNOSIS — L700 Acne vulgaris: Secondary | ICD-10-CM | POA: Diagnosis not present

## 2019-02-14 DIAGNOSIS — Z79899 Other long term (current) drug therapy: Secondary | ICD-10-CM | POA: Diagnosis not present

## 2019-02-22 NOTE — Telephone Encounter (Signed)
Opened in error

## 2019-03-07 ENCOUNTER — Ambulatory Visit
Admission: RE | Admit: 2019-03-07 | Discharge: 2019-03-07 | Disposition: A | Discharge status: Home / Self Care | Payer: BC Managed Care – PPO | Source: Ambulatory Visit | Attending: Obstetrics & Gynecology | Admitting: Obstetrics & Gynecology

## 2019-03-07 DIAGNOSIS — Z1231 Encounter for screening mammogram for malignant neoplasm of breast: Secondary | ICD-10-CM

## 2019-03-08 ENCOUNTER — Other Ambulatory Visit: Payer: Self-pay | Admitting: Obstetrics & Gynecology

## 2019-03-08 DIAGNOSIS — R928 Other abnormal and inconclusive findings on diagnostic imaging of breast: Secondary | ICD-10-CM

## 2019-03-09 ENCOUNTER — Ambulatory Visit
Admission: RE | Admit: 2019-03-09 | Discharge: 2019-03-09 | Disposition: A | Discharge status: Home / Self Care | Payer: BC Managed Care – PPO | Source: Ambulatory Visit | Attending: Obstetrics & Gynecology | Admitting: Obstetrics & Gynecology

## 2019-03-09 ENCOUNTER — Other Ambulatory Visit: Payer: Self-pay | Admitting: Obstetrics & Gynecology

## 2019-03-09 DIAGNOSIS — R928 Other abnormal and inconclusive findings on diagnostic imaging of breast: Secondary | ICD-10-CM

## 2019-03-09 DIAGNOSIS — N6012 Diffuse cystic mastopathy of left breast: Secondary | ICD-10-CM | POA: Diagnosis not present

## 2019-03-09 DIAGNOSIS — N6320 Unspecified lump in the left breast, unspecified quadrant: Secondary | ICD-10-CM

## 2019-03-14 DIAGNOSIS — I32 Pericarditis in diseases classified elsewhere: Secondary | ICD-10-CM | POA: Diagnosis not present

## 2019-03-14 DIAGNOSIS — M25512 Pain in left shoulder: Secondary | ICD-10-CM | POA: Diagnosis not present

## 2019-03-14 DIAGNOSIS — R768 Other specified abnormal immunological findings in serum: Secondary | ICD-10-CM | POA: Diagnosis not present

## 2019-03-16 DIAGNOSIS — Z79899 Other long term (current) drug therapy: Secondary | ICD-10-CM | POA: Diagnosis not present

## 2019-03-16 DIAGNOSIS — L700 Acne vulgaris: Secondary | ICD-10-CM | POA: Diagnosis not present

## 2019-03-17 ENCOUNTER — Other Ambulatory Visit: Payer: Self-pay | Admitting: Obstetrics & Gynecology

## 2019-03-17 DIAGNOSIS — Z79899 Other long term (current) drug therapy: Secondary | ICD-10-CM | POA: Diagnosis not present

## 2019-03-17 DIAGNOSIS — N6320 Unspecified lump in the left breast, unspecified quadrant: Secondary | ICD-10-CM

## 2019-03-17 DIAGNOSIS — L700 Acne vulgaris: Secondary | ICD-10-CM | POA: Diagnosis not present

## 2019-03-24 ENCOUNTER — Ambulatory Visit
Admission: RE | Admit: 2019-03-24 | Discharge: 2019-03-24 | Disposition: A | Discharge status: Home / Self Care | Payer: BC Managed Care – PPO | Source: Ambulatory Visit | Attending: Obstetrics & Gynecology | Admitting: Obstetrics & Gynecology

## 2019-03-24 ENCOUNTER — Other Ambulatory Visit: Payer: Self-pay

## 2019-03-24 DIAGNOSIS — N6320 Unspecified lump in the left breast, unspecified quadrant: Secondary | ICD-10-CM

## 2019-04-04 DIAGNOSIS — L700 Acne vulgaris: Secondary | ICD-10-CM | POA: Diagnosis not present

## 2019-04-04 DIAGNOSIS — I309 Acute pericarditis, unspecified: Secondary | ICD-10-CM | POA: Diagnosis not present

## 2019-04-04 DIAGNOSIS — M940 Chondrocostal junction syndrome [Tietze]: Secondary | ICD-10-CM | POA: Diagnosis not present

## 2019-04-04 DIAGNOSIS — J302 Other seasonal allergic rhinitis: Secondary | ICD-10-CM | POA: Diagnosis not present

## 2019-04-18 DIAGNOSIS — L700 Acne vulgaris: Secondary | ICD-10-CM | POA: Diagnosis not present

## 2019-04-18 DIAGNOSIS — Z79899 Other long term (current) drug therapy: Secondary | ICD-10-CM | POA: Diagnosis not present

## 2019-05-13 DIAGNOSIS — Z79899 Other long term (current) drug therapy: Secondary | ICD-10-CM | POA: Diagnosis not present

## 2019-05-13 DIAGNOSIS — I309 Acute pericarditis, unspecified: Secondary | ICD-10-CM | POA: Diagnosis not present

## 2019-05-13 DIAGNOSIS — Z23 Encounter for immunization: Secondary | ICD-10-CM | POA: Diagnosis not present

## 2019-05-13 DIAGNOSIS — Z13220 Encounter for screening for lipoid disorders: Secondary | ICD-10-CM | POA: Diagnosis not present

## 2019-05-17 ENCOUNTER — Encounter: Payer: Self-pay | Admitting: Gynecology

## 2019-05-25 ENCOUNTER — Other Ambulatory Visit: Payer: Self-pay | Admitting: Cardiovascular Disease

## 2019-06-09 DIAGNOSIS — Z79899 Other long term (current) drug therapy: Secondary | ICD-10-CM | POA: Diagnosis not present

## 2019-06-09 DIAGNOSIS — L700 Acne vulgaris: Secondary | ICD-10-CM | POA: Diagnosis not present

## 2019-06-21 ENCOUNTER — Telehealth: Payer: Self-pay | Admitting: Cardiovascular Disease

## 2019-06-21 NOTE — Telephone Encounter (Signed)
° ° °  Pt c/o of Chest Pain: STAT if CP now or developed within 24 hours  1. Are you having CP right now? no  2. Are you experiencing any other symptoms (ex. SOB, nausea, vomiting, sweating)?no 3. How long have you been experiencing CP? 1 week  4. Is your CP continuous or coming and going? Coming and going 5. Have you taken Nitroglycerin? no?

## 2019-06-21 NOTE — Telephone Encounter (Signed)
Patient states for the past week- she has had some tightness in her chest- patient denies shortness of breath, and swelling in hands/feet; patient does not have nitroglycerin to take- patient unsure of BP or HR at this time- I advised patient if chest pressure/pain worsens- she becomes SOB- or has neck/jaw pain to go to ER and not wait for appointment- but I would send a message to NP for appointment for tomorrow morning.   Patient verbalized understanding.

## 2019-06-22 ENCOUNTER — Ambulatory Visit: Payer: BC Managed Care – PPO | Admitting: Adult Health

## 2019-06-22 ENCOUNTER — Other Ambulatory Visit: Payer: Self-pay

## 2019-06-22 ENCOUNTER — Encounter: Payer: Self-pay | Admitting: Adult Health

## 2019-06-22 VITALS — BP 114/79 | HR 58 | Temp 97.9°F | Ht 67.0 in | Wt 176.4 lb

## 2019-06-22 DIAGNOSIS — Z79899 Other long term (current) drug therapy: Secondary | ICD-10-CM

## 2019-06-22 DIAGNOSIS — I519 Heart disease, unspecified: Secondary | ICD-10-CM | POA: Diagnosis not present

## 2019-06-22 DIAGNOSIS — K219 Gastro-esophageal reflux disease without esophagitis: Secondary | ICD-10-CM | POA: Diagnosis not present

## 2019-06-22 DIAGNOSIS — I514 Myocarditis, unspecified: Secondary | ICD-10-CM | POA: Diagnosis not present

## 2019-06-22 LAB — BASIC METABOLIC PANEL
BUN/Creatinine Ratio: 22 (ref 9–23)
BUN: 19 mg/dL (ref 6–24)
CO2: 21 mmol/L (ref 20–29)
Calcium: 9.9 mg/dL (ref 8.7–10.2)
Chloride: 101 mmol/L (ref 96–106)
Creatinine, Ser: 0.85 mg/dL (ref 0.57–1.00)
GFR calc Af Amer: 94 mL/min/{1.73_m2} (ref >59)
GFR calc non Af Amer: 81 mL/min/{1.73_m2} (ref >59)
Glucose: 82 mg/dL (ref 65–99)
Potassium: 4.1 mmol/L (ref 3.5–5.2)
Sodium: 139 mmol/L (ref 134–144)

## 2019-06-22 LAB — SEDIMENTATION RATE: Sed Rate: 1 mm/hr (ref 0–32)

## 2019-06-22 MED ORDER — COLCHICINE 0.6 MG PO TABS: 0.6000 mg | ORAL_TABLET | Freq: Every day | ORAL | 3 refills | Status: DC

## 2019-06-22 MED ORDER — OMEPRAZOLE 40 MG PO CPDR: 40.0000 mg | DELAYED_RELEASE_CAPSULE | ORAL | 3 refills | Status: DC

## 2019-06-22 NOTE — Patient Instructions (Addendum)
Medication Instructions:  Continue current medications  *If you need a refill on your cardiac medications before your next appointment, please call your pharmacy*  Lab Work: BMP and ESR today  If you have labs (blood work) drawn today and your tests are completely normal, you will receive your results only by: Marland Kitchen MyChart Message (if you have MyChart) OR . A paper copy in the mail If you have any lab test that is abnormal or we need to change your treatment, we will call you to review the results.  Testing/Procedures: Your physician has requested that you have an echocardiogram. Echocardiography is a painless test that uses sound waves to create images of your heart. It provides your doctor with information about the size and shape of your heart and how well your heart's chambers and valves are working. This procedure takes approximately one hour. There are no restrictions for this procedure.  Follow-Up: At [facility], you and your health needs are our priority.  As part of our continuing mission to provide you with exceptional heart care, we have created designated Provider Care Teams.  These Care Teams include your primary Cardiologist (physician) and Advanced Practice Providers (APPs -  Physician Assistants and Nurse Practitioners) who all work together to provide you with the care you need, when you need it.  Your next appointment:   3 months  The format for your next appointment:   In Person  Provider:   Skeet Latch, MD

## 2019-06-22 NOTE — Progress Notes (Signed)
Cardiology Office Note   Date:  06/22/2019   ID:  [name] [name], DOB Sep 27, 1970, MRN [id number]  PCP:  [name], MD  Cardiologist:  Dr.[place] CX:KGYJE Pain   History of Present Illness: [name] is a 48 y.o. female who presents for ongoing assessment and management of chronic chest pain with history of myocarditis and pericarditis, palpitations.  She continues to have chronic pleuritic chest pain.  She had been treated with ibuprofen and colchicine.  She was followed by her rheumatologist Dr. Gavin Pound, on 04/2018 and was noted to have mildly positive rheumatoid factor ESR and CRP.  She tried to decrease her dose of ibuprofen but had recurrent pain after decreasing it to twice a day.  She was to continue on ibuprofen and colchicine routinely.  She was seen last by Dr. Oval Linsey on 01/11/2019 who noted that she had an echocardiogram in 05/14/2018 that revealed LVEF of 60% to 65% with no pericardial effusion.  She was found to have a left pleural effusion that was unchanged from hospitalization.  Dr. Oval Linsey noted on last appointment that she was diagnosed with acid reflux and was continuing to take omeprazole which helped.  She was changed from metoprolol to nebivolol due to fatigue which gave her improvement in the symptom.  She had rarely needed to use colchicine as she was not experiencing any chest pain or shortness of breath.  [name] called her office on 04/21/2019 with complaints of tightness in her chest, and is requesting an appointment today.  She states that she has been feeling some substernal chest pressure especially when she bends over, sometimes when she takes a deep breath she feels pressure.  She has stopped taking ibuprofen, omeprazole, she continues to take colchicine as needed.  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  Past Medical  History:  Diagnosis Date  . Acne   . Elevated troponin 04/28/2018  . Endometriosis   . HELLP (hemolytic anemia/elev liver enzymes/low platelets in pregnancy)    hx of two pregnancies  . Miscarriage   . Normal spontaneous vaginal delivery   . Thyroid dysfunction     Past Surgical History:  Procedure Laterality Date  . BILATERAL SALPINGECTOMY Right 10/16/2015   Procedure: RIGHT  SALPINGECTOMY;  Surgeon: Terrance Mass, MD;  Location: [facility];  Service: Gynecology;  Laterality: Right;  . BUNIONECTOMY Right 11/30/2015   @ [place]  . BUNIONECTOMY Left 12/21/2015   @ [place]  . BUNIONECTOMY Left 03/07/2016   '@[facility]'    . CESAREAN SECTION    . CYSTOSCOPY N/A 10/16/2015   Procedure: CYSTOSCOPY;  Surgeon: Terrance Mass, MD;  Location: [facility];  Service: Gynecology;  Laterality: N/A;  . DILATION AND CURETTAGE OF UTERUS    . LAPAROSCOPIC VAGINAL HYSTERECTOMY WITH SALPINGO OOPHORECTOMY Left 10/16/2015   Procedure: LAPAROSCOPIC ASSISTED VAGINAL HYSTERECTOMY WITH SALPINGO OOPHORECTOMY;  Surgeon: Terrance Mass, MD;  Location: [facility];  Service: Gynecology;  Laterality: Left;  . TUBAL LIGATION       Current Outpatient Medications  Medication Sig Dispense Refill  . BYSTOLIC 2.5 MG tablet TAKE 1 TABLET(2.5 MG) BY MOUTH DAILY 90 tablet 1  . Cholecalciferol (VITAMIN D3) 50 MCG (2000 UT) TABS Take by mouth.    . clindamycin-benzoyl peroxide (BENZACLIN) gel APPLY TO AFFECTED AREAS ON THE FACE QAM    . colchicine 0.6 MG tablet Take 1 tablet (0.6  mg total) by mouth daily. 90 tablet 3  . Cyanocobalamin (VITAMIN B-12 PO) Take 1 tablet by mouth daily.    Marland Kitchen Fexofenadine-Pseudoephedrine (ALLEGRA-D 24 HOUR PO) Take by mouth.    . Ginkgo Biloba 120 MG TABS Take 2 tablets by mouth daily.    . Multiple Vitamin (MULTIVITAMIN WITH MINERALS) TABS tablet Take 1 tablet by mouth daily.    Marland Kitchen omeprazole (PRILOSEC) 40 MG capsule Take 40 mg by mouth as directed.     . tretinoin (RETIN-A) 0.1 % cream APPLY A THIN COAT TO AFFECTED  AREAS 3 NIGHTS A WEEK THEN NIGHTLY AS TOLERATED     No current facility-administered medications for this visit.     Allergies:   Iodinated diagnostic agents, Iodine, Other, and Shellfish allergy    Social History:  The patient  reports that she has never smoked. She has never used smokeless tobacco. She reports current alcohol use. She reports that she does not use drugs.   Family History:  [name] family history includes Cancer in her brother and mother; Cataracts in her father; Diabetes in her brother, maternal grandmother, and mother; Heart disease in her maternal grandfather; Heart failure in her maternal grandfather; Hypertension in her mother; Kidney failure in her maternal grandmother; Lymphoma in her father; Sickle cell anemia in her maternal aunt; Stroke in her maternal grandfather; Thyroid disease in her mother.    ROS: All other systems are reviewed and negative. Unless otherwise mentioned in H&P    PHYSICAL EXAM: VS:  BP 114/79   Pulse (!) 58   Temp 97.9 F (36.6 C)   Ht '5\' 7"'  (1.702 m)   Wt 176 lb 6.4 oz (80 kg)   LMP 09/11/2015   SpO2 100%   BMI 27.63 kg/m  , BMI Body mass index is 27.63 kg/m. GEN: Well nourished, well developed, in no acute distress HEENT: normal Neck: no JVD, carotid bruits, or masses Cardiac: RRR; no murmurs, rubs, or gallops,no edema  Respiratory:  Clear to auscultation bilaterally, normal work of breathing GI: soft, nontender, nondistended, + BS MS: no deformity or atrophy Skin: warm and dry, no rash Neuro:  Strength and sensation are intact Psych: euthymic mood, full affect   EKG: Normal sinus rhythm sinus bradycardia heart rate 58 bpm.  No signs of pericarditis by EKG criteria  Recent Labs: 10/22/2018: BUN 17; Creatinine, Ser 0.86; Hemoglobin 14.7; Platelets 242; Potassium 3.9; Sodium 139    Lipid Panel    Component Value Date/Time   CHOL 163 04/28/2018 0047   TRIG 38 04/28/2018 0047   HDL 77 04/28/2018 0047   CHOLHDL 2.1  04/28/2018 0047   VLDL 8 04/28/2018 0047   LDLCALC 78 04/28/2018 0047   LDLCALC 100 (H) 11/26/2017 0843      Wt Readings from Last 3 Encounters:  06/22/19 176 lb 6.4 oz (80 kg)  01/11/19 174 lb 12.8 oz (79.3 kg)  12/02/18 178 lb (80.7 kg)      Other studies Reviewed: Echo 04/29/18: Study Conclusions  - Left ventricle: The cavity size was normal. Wall thickness was increased in a pattern of mild LVH. Systolic function was normal. The estimated ejection fraction was in the range of 60% to 65%. Wall motion was normal; there were no regional wall motion abnormalities. Features are consistent with a pseudonormal left ventricular filling pattern, with concomitant abnormal relaxation and increased filling pressure (grade 2 diastolic dysfunction). - Pulmonary arteries: Systolic pressure was mildly increased. PA peak pressure: 39 mm Hg (S). - Pericardium,  extracardiac: A trivial pericardial effusion was identified.  Echo 05/14/18: Study Conclusions  - Left ventricle: The cavity size was normal. There was mild concentric hypertrophy. Systolic function was normal. The estimated ejection fraction was in the range of 60% to 65%. Wall motion was normal; there were no regional wall motion abnormalities. Left ventricular diastolic function parameters were normal. - Aortic valve: There was no regurgitation. - Left atrium: The atrium was normal in size. - Right ventricle: Systolic function was normal. - Right atrium: The atrium was normal in size. - Tricuspid valve: There was mild regurgitation. - Pulmonary arteries: Systolic pressure was within the normal range. - Inferior vena cava: The vessel was normal in size. - Pericardium, extracardiac: There was no pericardial effusion. There was a left pleural effusion.  Impressions:  - There is no significant change since the prior study on 04/28/2018, there is no pericardial effusion and stable left  pleural effusion.   ASSESSMENT AND PLAN:  1.  Chest pain: Similar but not exactly to myocardial diet his pain.  She has not been taking ibuprofen or colchicine regularly.  She is just started taking the ibuprofen and colchicine again due to the symptoms.  I have advised her to do so.  We will check a BMET and ESR today.  EKG does not show evidence of pericarditis per criteria.  Check echocardiogram for comparison.  2.  GERD: She has not been taking omeprazole.  I will refill this as she may be experiencing some GERD symptoms as well.  She is to reduce her caffeine.    Current medicines are reviewed at length with the patient today.    Labs/ tests ordered today include: BMET, ESR  Phill Myron. West Pugh, ANP, [facility]   06/22/2019 10:54 AM    [facility] [place] [address] Office [phone] Fax [phone]  Notice: This dictation was prepared with Dragon dictation along with smaller phrase technology. Any transcriptional errors that result from this process are unintentional and may not be corrected upon review.

## 2019-07-01 ENCOUNTER — Other Ambulatory Visit: Payer: Self-pay

## 2019-07-01 ENCOUNTER — Ambulatory Visit (HOSPITAL_COMMUNITY): Payer: BC Managed Care – PPO | Attending: Cardiology

## 2019-07-01 DIAGNOSIS — I514 Myocarditis, unspecified: Secondary | ICD-10-CM | POA: Insufficient documentation

## 2019-07-20 ENCOUNTER — Other Ambulatory Visit: Payer: Self-pay | Admitting: Cardiovascular Disease

## 2019-07-20 MED ORDER — IBUPROFEN 800 MG PO TABS: 800.0000 mg | ORAL_TABLET | Freq: Three times a day (TID) | ORAL | 0 refills | Status: DC | PRN

## 2019-07-20 NOTE — Telephone Encounter (Signed)
Yes can refill ibuprofen

## 2019-07-20 NOTE — Telephone Encounter (Signed)
New Message  Pt c/o medication issue:  1. Name of Medication: Ibuprofen 800 mg  2. How are you currently taking this medication (dosage and times per day)? 1 tablet, 3 times daily with meal  3. Are you having a reaction (difficulty breathing--STAT)? No  4. What is your medication issue? Out of refills; needs new prescription

## 2019-08-16 DIAGNOSIS — S62607A Fracture of unspecified phalanx of left little finger, initial encounter for closed fracture: Secondary | ICD-10-CM | POA: Diagnosis not present

## 2019-08-26 HISTORY — PX: BREAST EXCISIONAL BIOPSY: SUR124

## 2019-09-12 DIAGNOSIS — Z20822 Contact with and (suspected) exposure to covid-19: Secondary | ICD-10-CM | POA: Diagnosis not present

## 2019-09-13 ENCOUNTER — Other Ambulatory Visit: Payer: BC Managed Care – PPO

## 2019-09-19 DIAGNOSIS — S62607D Fracture of unspecified phalanx of left little finger, subsequent encounter for fracture with routine healing: Secondary | ICD-10-CM | POA: Diagnosis not present

## 2019-09-20 ENCOUNTER — Ambulatory Visit: Payer: BC Managed Care – PPO | Admitting: Cardiovascular Disease

## 2019-09-20 ENCOUNTER — Encounter: Payer: Self-pay | Admitting: Cardiovascular Disease

## 2019-09-20 ENCOUNTER — Other Ambulatory Visit: Payer: Self-pay

## 2019-09-20 VITALS — BP 130/78 | HR 62 | Temp 97.9°F | Ht 67.0 in | Wt 181.8 lb

## 2019-09-20 DIAGNOSIS — I319 Disease of pericardium, unspecified: Secondary | ICD-10-CM | POA: Diagnosis not present

## 2019-09-20 DIAGNOSIS — R002 Palpitations: Secondary | ICD-10-CM

## 2019-09-20 MED ORDER — DICLOFENAC SODIUM 1 % EX GEL: 2.0000 g | Freq: Four times a day (QID) | CUTANEOUS | 0 refills | Status: DC

## 2019-09-20 NOTE — Progress Notes (Signed)
Cardiology Office Note   Date:  09/20/2019   ID:  Sherri Sear, DOB March 23, 1971, MRN [id number]  PCP:  [name], MD  Cardiologist:   Skeet Latch, MD   No chief complaint on file.    History of Present Illness: [name] is a 49 y.o. female with pericarditis who presents for follow up. [name] was admitted 04/2018 with chest pain. She has a history of palpitations that she attributes to stress at work. 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 However, for the week before she went to the hospital she noted feeling more anxious. She had no URI at the time. She was seen in the emergency department where EKG was initially concerning for STEMI. Troponin was elevated to 0.75 and ESR was 30. She was thought to have myopericarditis and was started on ibuprofen and colchicine. She was also given a prescription for oxycodone. She followed up in clinic 9/12 and was doing better but still had some pleuritic chest pain. She started to become more active but developed recurrent chest pain. She had a repeat echocardiogram 05/14/2018 that revealed LVEF 60 to 65% with no pericardial effusion. She did have a left pleural effusion that was unchanged from the hospital. She followed up with her rheumatologist, Dr. Gavin Pound, 04/2018. She was noted to have a mildly positive rheumatoid factor, ESR, and CRP. She tried tapering her ibuprofen and had recurrent pain after decreasing to twice a day. It was decided that she would resume both ibuprofen and colchicine for the holidays.   At her last appointment [name] was doing well, though she was struggling with GERD.  Since her last appointment [name] was seen by Jory Sims, DNP on 05/2019.  She was doing well at that time.  She saw her rheumatologist and started back on colchicine, ibuprofen and nebivolol.  She continues to have some episodes of chest pain.  However she  thinks it may be anxiety related.  Her discomfort is not pleuritic.  She has not been getting much exercise lately.  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  She hasn't had much time to exercise.  She has also been stressed due to the changes needed for COVID-19.  She denies fever, chills, edema or shortness of breath.    Past Medical History:  Diagnosis Date  . Acne   . Elevated troponin 04/28/2018  . Endometriosis   . HELLP (hemolytic anemia/elev liver enzymes/low platelets in pregnancy)    hx of two pregnancies  . Miscarriage   . Normal spontaneous vaginal delivery   . Thyroid dysfunction     Past Surgical History:  Procedure Laterality Date  . BILATERAL SALPINGECTOMY Right 10/16/2015   Procedure: RIGHT  SALPINGECTOMY;  Surgeon: Terrance Mass, MD;  Location: [facility];  Service: Gynecology;  Laterality: Right;  . BUNIONECTOMY Right 11/30/2015   @ [place]  . BUNIONECTOMY Left 12/21/2015   @ [place]  . BUNIONECTOMY Left 03/07/2016   _0    . CESAREAN SECTION    . CYSTOSCOPY N/A 10/16/2015   Procedure: CYSTOSCOPY;  Surgeon: Terrance Mass, MD;  Location: [facility];  Service: Gynecology;  Laterality: N/A;  . DILATION AND CURETTAGE OF UTERUS    . LAPAROSCOPIC VAGINAL HYSTERECTOMY WITH SALPINGO OOPHORECTOMY Left 10/16/2015   Procedure: LAPAROSCOPIC ASSISTED VAGINAL HYSTERECTOMY WITH SALPINGO OOPHORECTOMY;  Surgeon: Terrance Mass, MD;  Location: [facility];  Service: Gynecology;  Laterality: Left;  .  Cardiology Office Note   Date:  09/20/2019   ID:  Sherri Sear, DOB April 06, 1971, MRN [id number]  PCP:  [name], MD  Cardiologist:   Skeet Latch, MD   No chief complaint on file.    History of Present Illness: [name] is a 49 y.o. female with pericarditis who presents for follow up. [name] was admitted 04/2018 with chest pain. She has a history of palpitations that she attributes to stress at work. 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 However, for the week before she went to the hospital she noted feeling more anxious. She had no URI at the time. She was seen in the emergency department where EKG was initially concerning for STEMI. Troponin was elevated to 0.75 and ESR was 30. She was thought to have myopericarditis and was started on ibuprofen and colchicine. She was also given a prescription for oxycodone. She followed up in clinic 9/12 and was doing better but still had some pleuritic chest pain. She started to become more active but developed recurrent chest pain. She had a repeat echocardiogram 05/14/2018 that revealed LVEF 60 to 65% with no pericardial effusion. She did have a left pleural effusion that was unchanged from the hospital. She followed up with her rheumatologist, Dr. Gavin Pound, 04/2018. She was noted to have a mildly positive rheumatoid factor, ESR, and CRP. She tried tapering her ibuprofen and had recurrent pain after decreasing to twice a day. It was decided that she would resume both ibuprofen and colchicine for the holidays.   At her last appointment [name] was doing well, though she was struggling with GERD.  Since her last appointment [name] was seen by Jory Sims, DNP on 05/2019.  She was doing well at that time.  She saw her rheumatologist and started back on colchicine, ibuprofen and nebivolol.  She continues to have some episodes of chest pain.  However she  thinks it may be anxiety related.  Her discomfort is not pleuritic.  She has not been getting much exercise lately.  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  She hasn't had much time to exercise.  She has also been stressed due to the changes needed for COVID-19.  She denies fever, chills, edema or shortness of breath.    Past Medical History:  Diagnosis Date  . Acne   . Elevated troponin 04/28/2018  . Endometriosis   . HELLP (hemolytic anemia/elev liver enzymes/low platelets in pregnancy)    hx of two pregnancies  . Miscarriage   . Normal spontaneous vaginal delivery   . Thyroid dysfunction     Past Surgical History:  Procedure Laterality Date  . BILATERAL SALPINGECTOMY Right 10/16/2015   Procedure: RIGHT  SALPINGECTOMY;  Surgeon: Terrance Mass, MD;  Location: [facility];  Service: Gynecology;  Laterality: Right;  . BUNIONECTOMY Right 11/30/2015   @ [place]  . BUNIONECTOMY Left 12/21/2015   @ [place]  . BUNIONECTOMY Left 03/07/2016   _0    . CESAREAN SECTION    . CYSTOSCOPY N/A 10/16/2015   Procedure: CYSTOSCOPY;  Surgeon: Terrance Mass, MD;  Location: [facility];  Service: Gynecology;  Laterality: N/A;  . DILATION AND CURETTAGE OF UTERUS    . LAPAROSCOPIC VAGINAL HYSTERECTOMY WITH SALPINGO OOPHORECTOMY Left 10/16/2015   Procedure: LAPAROSCOPIC ASSISTED VAGINAL HYSTERECTOMY WITH SALPINGO OOPHORECTOMY;  Surgeon: Terrance Mass, MD;  Location: [facility];  Service: Gynecology;  Laterality: Left;  .  Cardiology Office Note   Date:  09/20/2019   ID:  Sherri Sear, DOB March 23, 1971, MRN [id number]  PCP:  [name], MD  Cardiologist:   Skeet Latch, MD   No chief complaint on file.    History of Present Illness: [name] is a 49 y.o. female with pericarditis who presents for follow up. [name] was admitted 04/2018 with chest pain. She has a history of palpitations that she attributes to stress at work. 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 However, for the week before she went to the hospital she noted feeling more anxious. She had no URI at the time. She was seen in the emergency department where EKG was initially concerning for STEMI. Troponin was elevated to 0.75 and ESR was 30. She was thought to have myopericarditis and was started on ibuprofen and colchicine. She was also given a prescription for oxycodone. She followed up in clinic 9/12 and was doing better but still had some pleuritic chest pain. She started to become more active but developed recurrent chest pain. She had a repeat echocardiogram 05/14/2018 that revealed LVEF 60 to 65% with no pericardial effusion. She did have a left pleural effusion that was unchanged from the hospital. She followed up with her rheumatologist, Dr. Gavin Pound, 04/2018. She was noted to have a mildly positive rheumatoid factor, ESR, and CRP. She tried tapering her ibuprofen and had recurrent pain after decreasing to twice a day. It was decided that she would resume both ibuprofen and colchicine for the holidays.   At her last appointment [name] was doing well, though she was struggling with GERD.  Since her last appointment [name] was seen by Jory Sims, DNP on 05/2019.  She was doing well at that time.  She saw her rheumatologist and started back on colchicine, ibuprofen and nebivolol.  She continues to have some episodes of chest pain.  However she  thinks it may be anxiety related.  Her discomfort is not pleuritic.  She has not been getting much exercise lately.  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  She hasn't had much time to exercise.  She has also been stressed due to the changes needed for COVID-19.  She denies fever, chills, edema or shortness of breath.    Past Medical History:  Diagnosis Date  . Acne   . Elevated troponin 04/28/2018  . Endometriosis   . HELLP (hemolytic anemia/elev liver enzymes/low platelets in pregnancy)    hx of two pregnancies  . Miscarriage   . Normal spontaneous vaginal delivery   . Thyroid dysfunction     Past Surgical History:  Procedure Laterality Date  . BILATERAL SALPINGECTOMY Right 10/16/2015   Procedure: RIGHT  SALPINGECTOMY;  Surgeon: Terrance Mass, MD;  Location: [facility];  Service: Gynecology;  Laterality: Right;  . BUNIONECTOMY Right 11/30/2015   @ [place]  . BUNIONECTOMY Left 12/21/2015   @ [place]  . BUNIONECTOMY Left 03/07/2016   _0    . CESAREAN SECTION    . CYSTOSCOPY N/A 10/16/2015   Procedure: CYSTOSCOPY;  Surgeon: Terrance Mass, MD;  Location: [facility];  Service: Gynecology;  Laterality: N/A;  . DILATION AND CURETTAGE OF UTERUS    . LAPAROSCOPIC VAGINAL HYSTERECTOMY WITH SALPINGO OOPHORECTOMY Left 10/16/2015   Procedure: LAPAROSCOPIC ASSISTED VAGINAL HYSTERECTOMY WITH SALPINGO OOPHORECTOMY;  Surgeon: Terrance Mass, MD;  Location: [facility];  Service: Gynecology;  Laterality: Left;  .

## 2019-09-20 NOTE — Patient Instructions (Addendum)
Medication Instructions:  Continue current medications  *If you need a refill on your cardiac medications before your next appointment, please call your pharmacy*  Lab Work: None Ordered  Testing/Procedures: None Ordered  Follow-Up: At [facility], you and your health needs are our priority.  As part of our continuing mission to provide you with exceptional heart care, we have created designated Provider Care Teams.  These Care Teams include your primary Cardiologist (physician) and Advanced Practice Providers (APPs -  Physician Assistants and Nurse Practitioners) who all work together to provide you with the care you need, when you need it.  Your next appointment:   6 month(s)  The format for your next appointment:   In Person  Provider:   Tiffany Olympia, MD    

## 2019-09-22 ENCOUNTER — Ambulatory Visit: Payer: BC Managed Care – PPO | Admitting: Cardiovascular Disease

## 2019-09-23 ENCOUNTER — Ambulatory Visit
Admission: RE | Admit: 2019-09-23 | Discharge: 2019-09-23 | Disposition: A | Discharge status: Home / Self Care | Payer: BC Managed Care – PPO | Source: Ambulatory Visit | Attending: Obstetrics & Gynecology | Admitting: Obstetrics & Gynecology

## 2019-09-23 ENCOUNTER — Other Ambulatory Visit: Payer: Self-pay | Admitting: Obstetrics & Gynecology

## 2019-09-23 ENCOUNTER — Other Ambulatory Visit: Payer: Self-pay

## 2019-09-23 DIAGNOSIS — N6320 Unspecified lump in the left breast, unspecified quadrant: Secondary | ICD-10-CM

## 2019-09-23 DIAGNOSIS — N6012 Diffuse cystic mastopathy of left breast: Secondary | ICD-10-CM | POA: Diagnosis not present

## 2019-10-19 DIAGNOSIS — I32 Pericarditis in diseases classified elsewhere: Secondary | ICD-10-CM | POA: Diagnosis not present

## 2019-10-19 DIAGNOSIS — R768 Other specified abnormal immunological findings in serum: Secondary | ICD-10-CM | POA: Diagnosis not present

## 2019-10-19 DIAGNOSIS — R079 Chest pain, unspecified: Secondary | ICD-10-CM | POA: Diagnosis not present

## 2019-10-19 DIAGNOSIS — M25512 Pain in left shoulder: Secondary | ICD-10-CM | POA: Diagnosis not present

## 2019-10-21 ENCOUNTER — Telehealth: Payer: Self-pay | Admitting: Cardiovascular Disease

## 2019-10-21 NOTE — Telephone Encounter (Signed)
We are recommending the COVID-19 vaccine to all of our patients. Cardiac medications (including blood thinners) should not deter anyone from being vaccinated and there is no need to hold any of those medications prior to vaccine administration.     Currently, there is a hotline to call (active 09/02/19) to schedule vaccination appointments as no walk-ins will be accepted.   Number: [phone].    If an appointment is not available please go to [email or web address] to sign up for notification when additional vaccine appointments are available.   If you have further questions or concerns about the vaccine process, please visit [email or web address] or contact your primary care physician.   I have informed the patient of the instructions.

## 2019-10-22 ENCOUNTER — Ambulatory Visit: Payer: BC Managed Care – PPO | Attending: Internal Medicine

## 2019-10-22 DIAGNOSIS — Z23 Encounter for immunization: Secondary | ICD-10-CM | POA: Insufficient documentation

## 2019-10-22 NOTE — Progress Notes (Signed)
   Covid-19 Vaccination Clinic  Name:  [name]    MRN: [id number] DOB: [birth year]  10/22/2019  Ms. [name] was observed post Covid-19 immunization for 30 minutes based on pre-vaccination screening without incidence. She was provided with Vaccine Information Sheet and instruction to access the V-Safe system.   Ms. [name] was instructed to call 911 with any severe reactions post vaccine: Marland Kitchen Difficulty breathing  . Swelling of your face and throat  . A fast heartbeat  . A bad rash all over your body  . Dizziness and weakness    Immunizations Administered    Name Date Dose VIS Date Route   Pfizer COVID-19 Vaccine 10/22/2019  6:46 PM 0.3 mL 08/05/2019 Intramuscular   Manufacturer: [place]   Lot: [id number]   [place]: [id number]

## 2019-10-24 DIAGNOSIS — S62607D Fracture of unspecified phalanx of left little finger, subsequent encounter for fracture with routine healing: Secondary | ICD-10-CM | POA: Diagnosis not present

## 2019-11-08 DIAGNOSIS — S62667A Nondisplaced fracture of distal phalanx of left little finger, initial encounter for closed fracture: Secondary | ICD-10-CM | POA: Diagnosis not present

## 2019-11-09 ENCOUNTER — Other Ambulatory Visit: Payer: Self-pay

## 2019-11-09 MED ORDER — NEBIVOLOL HCL 2.5 MG PO TABS: ORAL_TABLET | ORAL | 2 refills | Status: DC

## 2019-11-12 ENCOUNTER — Ambulatory Visit: Payer: BC Managed Care – PPO | Attending: Internal Medicine

## 2019-11-12 DIAGNOSIS — Z23 Encounter for immunization: Secondary | ICD-10-CM

## 2019-11-12 NOTE — Progress Notes (Signed)
   Covid-19 Vaccination Clinic  Name:  [name]    MRN: [id number] DOB: 03/06/1971  11/12/2019  [name] was observed post Covid-19 immunization for 30 minutes based on pre-vaccination screening without incident. She was provided with Vaccine Information Sheet and instruction to access the V-Safe system.   [name] was instructed to call 911 with any severe reactions post vaccine: Marland Kitchen Difficulty breathing  . Swelling of face and throat  . A fast heartbeat  . A bad rash all over body  . Dizziness and weakness   Immunizations Administered    Name Date Dose VIS Date Route   Pfizer COVID-19 Vaccine 11/12/2019 11:08 AM 0.3 mL 08/05/2019 Intramuscular   Manufacturer: [place]   Lot: [id number]   [place]: [id number]

## 2019-12-02 ENCOUNTER — Ambulatory Visit: Payer: Self-pay | Admitting: *Deleted

## 2019-12-05 ENCOUNTER — Other Ambulatory Visit: Payer: Self-pay

## 2019-12-05 ENCOUNTER — Other Ambulatory Visit: Payer: Self-pay | Admitting: *Deleted

## 2019-12-05 ENCOUNTER — Ambulatory Visit (INDEPENDENT_AMBULATORY_CARE_PROVIDER_SITE_OTHER): Payer: BC Managed Care – PPO | Admitting: Obstetrics & Gynecology

## 2019-12-05 ENCOUNTER — Encounter: Payer: Self-pay | Admitting: Obstetrics & Gynecology

## 2019-12-05 VITALS — BP 132/82 | Ht 66.0 in | Wt 181.0 lb

## 2019-12-05 DIAGNOSIS — E663 Overweight: Secondary | ICD-10-CM

## 2019-12-05 DIAGNOSIS — N951 Menopausal and female climacteric states: Secondary | ICD-10-CM

## 2019-12-05 DIAGNOSIS — Z90710 Acquired absence of both cervix and uterus: Secondary | ICD-10-CM | POA: Diagnosis not present

## 2019-12-05 DIAGNOSIS — Z01419 Encounter for gynecological examination (general) (routine) without abnormal findings: Secondary | ICD-10-CM

## 2019-12-05 MED ORDER — NEBIVOLOL HCL 2.5 MG PO TABS: ORAL_TABLET | ORAL | 1 refills | Status: DC

## 2019-12-05 NOTE — Progress Notes (Signed)
[name] 08-18-1971 [id number]   History:    49 y.o.  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  RP:  Established patient presenting for annual gyn exam   HPI: S/P LAVH.  Hot flushes, night sweats x February 2021.  No pelvic pain.  Starting to have dryness with intercourse.  Urine and bowel movements normal.  Breast normal.  Body mass index 29.21.  Would like to loose weight.  Exercising on the treadmill.  Health labs with family physician.  Past medical history,surgical history, family history and social history were all reviewed and documented in the EPIC chart.  Gynecologic History Patient's last menstrual period was 09/11/2015.  Obstetric History OB History  Gravida Para Term Preterm AB Living  2 2 1 1   2   SAB TAB Ectopic Multiple Live Births          2    # Outcome Date GA Lbr Len/2nd Weight Sex Delivery Anes PTL Lv  2 Preterm     F CS-Unspec  Y LIV  1 Term     M CS-Unspec  N LIV     ROS: A ROS was performed and pertinent positives and negatives are included in the history.  GENERAL: No fevers or chills. HEENT: No change in vision, no earache, sore throat or sinus congestion. NECK: No pain or stiffness. CARDIOVASCULAR: No chest pain or pressure. No palpitations. PULMONARY: No shortness of breath, cough or wheeze. GASTROINTESTINAL: No abdominal pain, nausea, vomiting or diarrhea, melena or bright red blood per rectum. GENITOURINARY: No urinary frequency, urgency, hesitancy or dysuria. MUSCULOSKELETAL: No joint or muscle pain, no back pain, no recent trauma. DERMATOLOGIC: No rash, no itching, no lesions. ENDOCRINE: No polyuria, polydipsia, no heat or cold intolerance. No recent change in weight. HEMATOLOGICAL: No anemia or easy bruising or bleeding. NEUROLOGIC: No headache, seizures, numbness, tingling or weakness. PSYCHIATRIC: No depression, no loss of interest in normal activity or change in sleep pattern.     Exam:   BP  132/82   Ht 5\' 6"  (1.676 m)   Wt 181 lb (82.1 kg)   LMP 09/11/2015   BMI 29.21 kg/m   Body mass index is 29.21 kg/m.  General appearance : Well developed well nourished female. No acute distress HEENT: Eyes: no retinal hemorrhage or exudates,  Neck supple, trachea midline, no carotid bruits, no thyroidmegaly Lungs: Clear to auscultation, no rhonchi or wheezes, or rib retractions  Heart: Regular rate and rhythm, no murmurs or gallops Breast:Examined in sitting and supine position were symmetrical in appearance, no palpable masses or tenderness,  no skin retraction, no nipple inversion, no nipple discharge, no skin discoloration, no axillary or supraclavicular lymphadenopathy Abdomen: no palpable masses or tenderness, no rebound or guarding Extremities: no edema or skin discoloration or tenderness  Pelvic: Vulva: Normal             Vagina: No gross lesions or discharge  Cervix/Uterus absent  Adnexa  Without masses or tenderness  Anus: Normal   Assessment/Plan:  49 y.o. female for annual exam   1. 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  Pap test in April 2019 was negative, no indication to repeat a Pap test this year.  Breast exam normal.  Last screening mammogram January 2021 was probably benign, left diagnostic mammogram and ultrasound planned for July 2021.  We will schedule a colonoscopy through her family physician.  Health labs  with family physician.  2. History of LAVH  3. Perimenopause Hot flushes and night sweats as well as dryness with intercourse since February 2021.  Will observe at this time.  Coconut oil recommended for intercourse as needed.  4. Overweight (BMI 25.0-29.9) Recommend a low calorie/carb diet such as [employer].  Aerobic activities 5 times a week and light weightlifting every 2 days.  Princess Bruins MD, 4:16 PM 12/05/2019

## 2019-12-05 NOTE — Patient Instructions (Signed)
1. 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  Pap test in April 2019 was negative, no indication to repeat a Pap test this year.  Breast exam normal.  Last screening mammogram January 2021 was probably benign, left diagnostic mammogram and ultrasound planned for July 2021.  We will schedule a colonoscopy through her family physician.  Health labs with family physician.  2. History of LAVH  3. Perimenopause Hot flushes and night sweats as well as dryness with intercourse since February 2021.  Will observe at this time.  Coconut oil recommended for intercourse as needed.  4. Overweight (BMI 25.0-29.9) Recommend a low calorie/carb diet such as [employer].  Aerobic activities 5 times a week and light weightlifting every 2 days.  Genella Rife, it was a pleasure seeing you today!

## 2019-12-09 ENCOUNTER — Other Ambulatory Visit: Payer: Self-pay

## 2019-12-09 ENCOUNTER — Other Ambulatory Visit: Payer: Self-pay | Admitting: *Deleted

## 2020-03-16 ENCOUNTER — Other Ambulatory Visit: Payer: Self-pay | Admitting: Obstetrics & Gynecology

## 2020-03-16 ENCOUNTER — Ambulatory Visit
Admission: RE | Admit: 2020-03-16 | Discharge: 2020-03-16 | Disposition: A | Discharge status: Home / Self Care | Payer: BC Managed Care – PPO | Source: Ambulatory Visit | Attending: Obstetrics & Gynecology | Admitting: Obstetrics & Gynecology

## 2020-03-16 ENCOUNTER — Ambulatory Visit: Payer: BC Managed Care – PPO

## 2020-03-16 ENCOUNTER — Other Ambulatory Visit: Payer: Self-pay

## 2020-03-16 DIAGNOSIS — N6320 Unspecified lump in the left breast, unspecified quadrant: Secondary | ICD-10-CM

## 2020-03-16 DIAGNOSIS — R928 Other abnormal and inconclusive findings on diagnostic imaging of breast: Secondary | ICD-10-CM | POA: Diagnosis not present

## 2020-03-21 ENCOUNTER — Telehealth: Payer: Self-pay | Admitting: Cardiovascular Disease

## 2020-03-21 NOTE — Telephone Encounter (Signed)
LVM for patient to return call to be scheduled with Oval Linsey for follow up from recall list

## 2020-03-22 ENCOUNTER — Other Ambulatory Visit: Payer: Self-pay

## 2020-03-22 ENCOUNTER — Ambulatory Visit
Admission: RE | Admit: 2020-03-22 | Discharge: 2020-03-22 | Disposition: A | Discharge status: Home / Self Care | Payer: BC Managed Care – PPO | Source: Ambulatory Visit | Attending: Obstetrics & Gynecology | Admitting: Obstetrics & Gynecology

## 2020-03-22 DIAGNOSIS — N6320 Unspecified lump in the left breast, unspecified quadrant: Secondary | ICD-10-CM

## 2020-03-22 DIAGNOSIS — N6012 Diffuse cystic mastopathy of left breast: Secondary | ICD-10-CM | POA: Diagnosis not present

## 2020-03-22 DIAGNOSIS — N6321 Unspecified lump in the left breast, upper outer quadrant: Secondary | ICD-10-CM | POA: Diagnosis not present

## 2020-04-02 ENCOUNTER — Ambulatory Visit
Admission: RE | Admit: 2020-04-02 | Discharge: 2020-04-02 | Disposition: A | Discharge status: Home / Self Care | Payer: BC Managed Care – PPO | Source: Ambulatory Visit | Attending: Family Medicine | Admitting: Family Medicine

## 2020-04-02 ENCOUNTER — Telehealth: Payer: Self-pay | Admitting: Cardiovascular Disease

## 2020-04-02 ENCOUNTER — Other Ambulatory Visit: Payer: Self-pay

## 2020-04-02 ENCOUNTER — Other Ambulatory Visit: Payer: Self-pay | Admitting: Family Medicine

## 2020-04-02 DIAGNOSIS — M4316 Spondylolisthesis, lumbar region: Secondary | ICD-10-CM | POA: Diagnosis not present

## 2020-04-02 DIAGNOSIS — M5412 Radiculopathy, cervical region: Secondary | ICD-10-CM

## 2020-04-02 DIAGNOSIS — M4802 Spinal stenosis, cervical region: Secondary | ICD-10-CM | POA: Diagnosis not present

## 2020-04-02 DIAGNOSIS — M5416 Radiculopathy, lumbar region: Secondary | ICD-10-CM

## 2020-04-02 DIAGNOSIS — M4726 Other spondylosis with radiculopathy, lumbar region: Secondary | ICD-10-CM | POA: Diagnosis not present

## 2020-04-02 DIAGNOSIS — M50123 Cervical disc disorder at C6-C7 level with radiculopathy: Secondary | ICD-10-CM | POA: Diagnosis not present

## 2020-04-02 DIAGNOSIS — M533 Sacrococcygeal disorders, not elsewhere classified: Secondary | ICD-10-CM | POA: Diagnosis not present

## 2020-04-02 DIAGNOSIS — R200 Anesthesia of skin: Secondary | ICD-10-CM | POA: Diagnosis not present

## 2020-04-02 DIAGNOSIS — M4312 Spondylolisthesis, cervical region: Secondary | ICD-10-CM | POA: Diagnosis not present

## 2020-04-02 DIAGNOSIS — M5116 Intervertebral disc disorders with radiculopathy, lumbar region: Secondary | ICD-10-CM | POA: Diagnosis not present

## 2020-04-02 NOTE — Telephone Encounter (Signed)
Returned call to patient no answer.LMTC. 

## 2020-04-02 NOTE — Telephone Encounter (Signed)
[name] is calling to make Dr. Oval Linsey aware that she has been experiencing pain on her left side, from the top of her thigh to her knee. She states she has also had tingling in her left arm. Please call to discuss.

## 2020-04-03 NOTE — Telephone Encounter (Signed)
Spoke with pt, she was seen by her medical doctor yesterday and they have done x-rays and she is waiting for the results. She did not need anything from [place] today but to make a follow up appointment. Follow up scheduled next available with dr Oval Linsey.

## 2020-04-13 DIAGNOSIS — M5442 Lumbago with sciatica, left side: Secondary | ICD-10-CM | POA: Diagnosis not present

## 2020-04-23 DIAGNOSIS — M25512 Pain in left shoulder: Secondary | ICD-10-CM | POA: Diagnosis not present

## 2020-04-23 DIAGNOSIS — R079 Chest pain, unspecified: Secondary | ICD-10-CM | POA: Diagnosis not present

## 2020-04-23 DIAGNOSIS — I32 Pericarditis in diseases classified elsewhere: Secondary | ICD-10-CM | POA: Diagnosis not present

## 2020-04-23 DIAGNOSIS — M5432 Sciatica, left side: Secondary | ICD-10-CM | POA: Diagnosis not present

## 2020-04-24 DIAGNOSIS — M5442 Lumbago with sciatica, left side: Secondary | ICD-10-CM | POA: Diagnosis not present

## 2020-05-03 ENCOUNTER — Other Ambulatory Visit: Payer: Self-pay

## 2020-05-03 ENCOUNTER — Encounter: Payer: Self-pay | Admitting: Cardiovascular Disease

## 2020-05-03 ENCOUNTER — Ambulatory Visit: Payer: BC Managed Care – PPO | Admitting: Cardiovascular Disease

## 2020-05-03 VITALS — BP 124/80 | HR 65 | Ht 67.0 in | Wt 190.0 lb

## 2020-05-03 DIAGNOSIS — Z01812 Encounter for preprocedural laboratory examination: Secondary | ICD-10-CM

## 2020-05-03 DIAGNOSIS — R079 Chest pain, unspecified: Secondary | ICD-10-CM

## 2020-05-03 DIAGNOSIS — Z13220 Encounter for screening for lipoid disorders: Secondary | ICD-10-CM

## 2020-05-03 DIAGNOSIS — I319 Disease of pericardium, unspecified: Secondary | ICD-10-CM

## 2020-05-03 MED ORDER — PREDNISONE 50 MG PO TABS: ORAL_TABLET | ORAL | 0 refills | Status: DC

## 2020-05-03 MED ORDER — METOPROLOL TARTRATE 25 MG PO TABS: ORAL_TABLET | ORAL | 0 refills | Status: DC

## 2020-05-03 NOTE — Progress Notes (Signed)
Cardiology Office Note  Date:  05/08/2020   ID:  [name], DOB 09-Nov-1970, MRN [id number]  PCP:  Ileana Ladd, MD  Cardiologist:   Chilton Si, MD   No chief complaint on file.    History of Present Illness: [name] is a 49 y.o. female with pericarditis who presents for follow up. Ms. [name] was admitted 04/2018 with chest pain. She has a history of palpitations that she attributes to stress at work. 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 However, for the week before she went to the hospital she noted feeling more anxious. She had no URI at the time. She was seen in the emergency department where EKG was initially concerning for STEMI. Troponin was elevated to 0.75 and ESR was 30. She was thought to have myopericarditis and was started on ibuprofen and colchicine. She was also given a prescription for oxycodone. She followed up in clinic 9/12 and was doing better but still had some pleuritic chest pain. She started to become more active but developed recurrent chest pain. She had a repeat echocardiogram 05/14/2018 that revealed LVEF 60 to 65% with no pericardial effusion. She did have a left pleural effusion that was unchanged from the hospital. She followed up with her rheumatologist, Dr. Zenovia Jordan, 04/2018. She was noted to have a mildly positive rheumatoid factor, ESR, and CRP. She tried tapering her ibuprofen and had recurrent pain after decreasing to twice a day. It was decided that she would resume both ibuprofen and colchicine for the holidays.   Ms. Beem was seen by Joni Reining, DNP on 05/2019.  She was doing well at that time.  She saw her rheumatologist and started back on colchicine, ibuprofen and nebivolol.  At her last appointment she continued to have some episodes of chest pain.  However she thought it may be anxiety related.  It was atypical and not pleuritic.  In July she developed a  lot of pain in her  L leg and L chest.  The episodes did not occur with exertion and were not positional.  She had some chest tightness off and on that started back when school was in session.  She since noticed that it was associated with the stress of her school year starting back and COVID-19.  She is afraid to exercise because of the discomfort.  It was discovered that she has cervical disc disease and has started PT, which has also helped.     Past Medical History:  Diagnosis Date  . Acne   . Elevated troponin 04/28/2018  . Endometriosis   . HELLP (hemolytic anemia/elev liver enzymes/low platelets in pregnancy)    hx of two pregnancies  . Miscarriage   . Normal spontaneous vaginal delivery   . Thyroid dysfunction     Past Surgical History:  Procedure Laterality Date  . BILATERAL SALPINGECTOMY Right 10/16/2015   Procedure: RIGHT  SALPINGECTOMY;  Surgeon: Ok Edwards, MD;  Location: [facility];  Service: Gynecology;  Laterality: Right;  . BUNIONECTOMY Right 11/30/2015   @ [facility]  . BUNIONECTOMY Left 12/21/2015   @ [facility]  . BUNIONECTOMY Left 03/07/2016   @[facility]    . CESAREAN SECTION    . CYSTOSCOPY N/A 10/16/2015   Procedure: CYSTOSCOPY;  Surgeon: Ok Edwards, MD;  Location: [facility];  Service: Gynecology;  Laterality: N/A;  . DILATION AND CURETTAGE OF UTERUS    . LAPAROSCOPIC VAGINAL HYSTERECTOMY WITH SALPINGO OOPHORECTOMY Left  10/16/2015   Procedure: LAPAROSCOPIC ASSISTED VAGINAL HYSTERECTOMY WITH SALPINGO OOPHORECTOMY;  Surgeon: Ok Edwards, MD;  Location: [facility];  Service: Gynecology;  Laterality: Left;  . TUBAL LIGATION       Current Outpatient Medications  Medication Sig Dispense Refill  . Cholecalciferol (VITAMIN D3) 50 MCG (2000 UT) TABS Take by mouth.    . Cyanocobalamin (VITAMIN B-12 PO) Take 1 tablet by mouth daily.    Marland Kitchen ibuprofen (ADVIL) 800 MG tablet Take 800 mg by mouth as needed.    . Multiple Vitamin (MULTIVITAMIN WITH MINERALS) TABS tablet Take 1 tablet by mouth  daily.    . methocarbamol (ROBAXIN) 500 MG tablet Take 500 mg by mouth 3 (three) times daily as needed.    . metoprolol tartrate (LOPRESSOR) 25 MG tablet TAKE 1 TABLET BY MOUTH 2 HOURS PRIOR TO CT 1 tablet 0  . predniSONE (DELTASONE) 50 MG tablet TAKE 1 TABLET 13 HOURS, 7 HOURS, AND 1 HOUR PRIOR TO CT SCAN 3 tablet 0   No current facility-administered medications for this visit.    Allergies:   Iodinated diagnostic agents, Iodine, Other, and Shellfish allergy    Social History:  The patient  reports that she has never smoked. She has never used smokeless tobacco. She reports current alcohol use. She reports that she does not use drugs.   Family History:  [name] family history includes Cancer in her brother and mother; Cataracts in her father; Diabetes in her brother, maternal grandmother, and mother; Heart disease in her maternal grandfather; Heart failure in her maternal grandfather; Hypertension in her mother; Kidney failure in her maternal grandmother; Lymphoma in her father; Sickle cell anemia in her maternal aunt; Stroke in her maternal grandfather; Thyroid disease in her mother.    ROS:  Please see the history of present illness.   Otherwise, review of systems are positive for none.   All other systems are reviewed and negative.    PHYSICAL EXAM: VS:  BP 124/80   Pulse 65   Ht 5\' 7"  (1.702 m)   Wt 190 lb (86.2 kg)   LMP 09/11/2015   SpO2 98%   BMI 29.76 kg/m  , BMI Body mass index is 29.76 kg/m. GENERAL:  Well appearing HEENT: Pupils equal round and reactive, fundi not visualized, oral mucosa unremarkable NECK:  No jugular venous distention, waveform within normal limits, carotid upstroke brisk and symmetric, no bruits LUNGS:  Clear to auscultation bilaterally HEART:  RRR.  PMI not displaced or sustained,S1 and S2 within normal limits, no S3, no S4, no clicks, no rubs, no murmurs ABD:  Flat, positive bowel sounds normal in frequency in pitch, no bruits, no rebound, no  guarding, no midline pulsatile mass, no hepatomegaly, no splenomegaly EXT:  2 plus pulses throughout, no edema, no cyanosis no clubbing SKIN:  No rashes no nodules NEURO:  Cranial nerves II through XII grossly intact, motor grossly intact throughout PSYCH:  Cognitively intact, oriented to person place and time   EKG:  EKG is ordered today. 05/10/18: Sinus rhythm.  Rate 80 bpm.  Mind inferior, anterior and lateral ST elevation.  PVC 05/03/20: Sinus rhythm. Rate 65 bpm.   Echo 04/29/18: Study Conclusions  - Left ventricle: The cavity size was normal. Wall thickness was   increased in a pattern of mild LVH. Systolic function was normal.   The estimated ejection fraction was in the range of 60% to 65%.   Wall motion was normal; there were no regional wall motion  abnormalities. Features are consistent with a pseudonormal left   ventricular filling pattern, with concomitant abnormal relaxation   and increased filling pressure (grade 2 diastolic dysfunction). - Pulmonary arteries: Systolic pressure was mildly increased. PA   peak pressure: 39 mm Hg (S). - Pericardium, extracardiac: A trivial pericardial effusion was   identified.  Echo 05/14/18: Study Conclusions  - Left ventricle: The cavity size was normal. There was mild   concentric hypertrophy. Systolic function was normal. The   estimated ejection fraction was in the range of 60% to 65%. Wall   motion was normal; there were no regional wall motion   abnormalities. Left ventricular diastolic function parameters   were normal. - Aortic valve: There was no regurgitation. - Left atrium: The atrium was normal in size. - Right ventricle: Systolic function was normal. - Right atrium: The atrium was normal in size. - Tricuspid valve: There was mild regurgitation. - Pulmonary arteries: Systolic pressure was within the normal   range. - Inferior vena cava: The vessel was normal in size. - Pericardium, extracardiac: There was no  pericardial effusion.   There was a left pleural effusion.  Impressions:  - There is no significant change since the prior study on 04/28/2018,   there is no pericardial effusion and stable left pleural   effusion.  Recent Labs: 06/22/2019: BUN 19; Creatinine, Ser 0.85; Potassium 4.1; Sodium 139    Lipid Panel    Component Value Date/Time   CHOL 163 04/28/2018 0047   TRIG 38 04/28/2018 0047   HDL 77 04/28/2018 0047   CHOLHDL 2.1 04/28/2018 0047   VLDL 8 04/28/2018 0047   LDLCALC 78 04/28/2018 0047   LDLCALC 100 (H) 11/26/2017 0843      Wt Readings from Last 3 Encounters:  05/03/20 190 lb (86.2 kg)  12/05/19 181 lb (82.1 kg)  09/20/19 181 lb 12.8 oz (82.5 kg)      ASSESSMENT AND PLAN:  # Myopericarditis: Resolved.  Her chest pain is atypical and more related to anxiety and cervical disc disease.  We discussed stress reduction techniques and counselling. There was no residual pericardial effusion on echo 04/2018.  We will check an ESR to make sure there is no evidence of recurrent inflammation.  Coronary CT-A to ensure there is no obstructive CAD and for risk stratification.  Hopefully this will reassure her its OK ot exercise again.  She requires pre-treatment for contrast allergy..  # Atypical chest pain:  Symptoms are atypical.  Her L arm and leg pain are attributable to cervical disc disease.    # Palpitations: Improved on nebivolol but she stopped it.  It also seemed to help anxiety.   Current medicines are reviewed at length with the patient today.  The patient does not have concerns regarding medicines.  The following changes have been made:  none  Labs/ tests ordered today include:   Orders Placed This Encounter  Procedures  . CT CORONARY MORPH W/CTA COR W/SCORE W/CA W/CM &/OR WO/CM  . CT CORONARY FRACTIONAL FLOW RESERVE DATA PREP  . CT CORONARY FRACTIONAL FLOW RESERVE FLUID ANALYSIS  . Lipid panel  . Comprehensive metabolic panel  . Sed Rate (ESR)   . EKG 12-Lead   Time spent: 45 minutes-Greater than 50% of this time was spent in counseling, explanation of diagnosis, planning of further management, and coordination of care.    Disposition:   FU with Koya Hunger C. Duke Salvia, MD, [facility] in 6 months.   Signed, Ramonda Galyon C. Duke Salvia, MD, [facility]  05/08/2020 2:41 PM    [facility]

## 2020-05-03 NOTE — Patient Instructions (Addendum)
Medication Instructions:  TAKE METOPROLOL 25 MG TAKE 1 TABLET 2 HOURS PRIOR TO CT   Prednisone 50 mg - take 13 hours prior to test Take another Prednisone 50 mg 7 hours prior to test Take another Prednisone 50 mg 1 hour prior to test Take Benadryl 50 mg 1 hour prior to test . Patient must complete all four doses of above prophylactic medications. . Patient will need a ride after test due to Benadryl.  *If you need a refill on your cardiac medications before your next appointment, please call your pharmacy*  Lab Work: FASTING LP/CMET/SED RATE 1 WEEK PRIOR TO CT  If you have labs (blood work) drawn today and your tests are completely normal, you will receive your results only by: Marland Kitchen MyChart Message (if you have MyChart) OR . A paper copy in the mail If you have any lab test that is abnormal or we need to change your treatment, we will call you to review the results.   Testing/Procedures: Your physician has requested that you have cardiac CT. Cardiac computed tomography (CT) is a painless test that uses an x-ray machine to take clear, detailed pictures of your heart. For further information please visit [email or web address].[email or web address]. Please follow instruction sheet as given. ONCE INSURANCE HAS APPROVED THE OFFICE WILL CALL YOU TO ARRANGE  Follow-Up: At [facility], you and your health needs are our priority.  As part of our continuing mission to provide you with exceptional heart care, we have created designated Provider Care Teams.  These Care Teams include your primary Cardiologist (physician) and Advanced Practice Providers (APPs -  Physician Assistants and Nurse Practitioners) who all work together to provide you with the care you need, when you need it.  We recommend signing up for the patient portal called "MyChart".  Sign up information is provided on this After Visit Summary.  MyChart is used to connect with patients for Virtual Visits (Telemedicine).  Patients are able to view lab/test  results, encounter notes, upcoming appointments, etc.  Non-urgent messages can be sent to your provider as well.   To learn more about what you can do with MyChart, go to [email or web address].    Your next appointment:   6 month(s)  The format for your next appointment:   In Person  Provider:   You may see Skeet Latch, MD or one of the following Advanced Practice Providers on your designated Care Team:    Kerin Ransom, PA-C  [place], [place]  Coletta Memos, [place]  Other Instructions  Your cardiac CT will be scheduled at one of the below locations:   [facility] [address] [place], [place] [address] [phone]  [place] [address] [place], [place] [address] [phone]  If scheduled at [facility], please arrive at the [facility] main entrance of [facility] 30 minutes prior to test start time. Proceed to the [facility] Radiology Department (first floor) to check-in and test prep.  If scheduled at [facility], please arrive 15 mins early for check-in and test prep.  Please follow these instructions carefully (unless otherwise directed):  Hold all erectile dysfunction medications at least 3 days (72 hrs) prior to test.  On the Night Before the Test: . Be sure to Drink plenty of water. . Do not consume any caffeinated/decaffeinated beverages or chocolate 12 hours prior to your test. . Do not take any antihistamines 12 hours prior to your test. .  If the patient has contrast allergy: ? Patient will need a prescription for Prednisone and very clear instructions (as follows): 1. Prednisone 50 mg - take 13 hours prior to test 2. Take another Prednisone 50 mg 7 hours prior to test 3. Take another Prednisone 50 mg 1 hour prior to test 4. Take Benadryl 50 mg 1 hour prior to test . Patient must complete all four doses of above prophylactic  medications. . Patient will need a ride after test due to Benadryl.  On the Day of the Test: . Drink plenty of water. Do not drink any water within one hour of the test. . Do not eat any food 4 hours prior to the test. . You may take your regular medications prior to the test.  . Take metoprolol (Lopressor) two hours prior to test. . HOLD Furosemide/Hydrochlorothiazide morning of the test. . FEMALES- please wear underwire-free bra if available    Do not give Lopressor to patients with an allergy to lopressor or anyone with asthma or active COPD symptoms (currently taking steroids).  After the Test: . Drink plenty of water. . After receiving IV contrast, you may experience a mild flushed feeling. This is normal. . On occasion, you may experience a mild rash up to 24 hours after the test. This is not dangerous. If this occurs, you can take Benadryl 25 mg and increase your fluid intake. . If you experience trouble breathing, this can be serious. If it is severe call 911 IMMEDIATELY. If it is mild, please call our office. . If you take any of these medications: Glipizide/Metformin, Avandament, Glucavance, please do not take 48 hours after completing test unless otherwise instructed.  Once we have confirmed authorization from your insurance company, we will call you to set up a date and time for your test. Based on how quickly your insurance processes prior authorizations requests, please allow up to 4 weeks to be contacted for scheduling your Cardiac CT appointment. Be advised that routine Cardiac CT appointments could be scheduled as many as 8 weeks after your provider has ordered it.  For non-scheduling related questions, please contact the cardiac imaging nurse navigator should you have any questions/concerns: Marchia Bond, Cardiac Imaging Nurse Navigator Burley Saver, Interim Cardiac Imaging Nurse [place] [facility] Direct Office Dial: [phone]   For  scheduling needs, including cancellations and rescheduling, please call Vivien Rota at [phone], option 3.    Cardiac CT Angiogram A cardiac CT angiogram is a procedure to look at the heart and the area around the heart. It may be done to help find the cause of chest pains or other symptoms of heart disease. During this procedure, a substance called contrast dye is injected into the blood vessels in the area to be checked. A large X-ray machine, called a CT scanner, then takes detailed pictures of the heart and the surrounding area. The procedure is also sometimes called a coronary CT angiogram, coronary artery scanning, or CTA. A cardiac CT angiogram allows the health care provider to see how well blood is flowing to and from the heart. The health care provider will be able to see if there are any problems, such as:  Blockage or narrowing of the coronary arteries in the heart.  Fluid around the heart.  Signs of weakness or disease in the muscles, valves, and tissues of the heart. Tell a health care provider about:  Any allergies you have. This is especially important if you have had a previous  allergic reaction to contrast dye.  All medicines you are taking, including vitamins, herbs, eye drops, creams, and over-the-counter medicines.  Any blood disorders you have.  Any surgeries you have had.  Any medical conditions you have.  Whether you are pregnant or may be pregnant.  Any anxiety disorders, chronic pain, or other conditions you have that may increase your stress or prevent you from lying still. What are the risks? Generally, this is a safe procedure. However, problems may occur, including:  Bleeding.  Infection.  Allergic reactions to medicines or dyes.  Damage to other structures or organs.  Kidney damage from the contrast dye that is used.  Increased risk of cancer from radiation exposure. This risk is low. Talk with your health care provider about: ? The risks and  benefits of testing. ? How you can receive the lowest dose of radiation. What happens before the procedure?  Wear comfortable clothing and remove any jewelry, glasses, dentures, and hearing aids.  Follow instructions from your health care provider about eating and drinking. This may include: ? For 12 hours before the procedure -- avoid caffeine. This includes tea, coffee, soda, energy drinks, and diet pills. Drink plenty of water or other fluids that do not have caffeine in them. Being well hydrated can prevent complications. ? For 4-6 hours before the procedure -- stop eating and drinking. The contrast dye can cause nausea, but this is less likely if your stomach is empty.  Ask your health care provider about changing or stopping your regular medicines. This is especially important if you are taking diabetes medicines, blood thinners, or medicines to treat problems with erections (erectile dysfunction). What happens during the procedure?   Hair on your chest may need to be removed so that small sticky patches called electrodes can be placed on your chest. These will transmit information that helps to monitor your heart during the procedure.  An IV will be inserted into one of your veins.  You might be given a medicine to control your heart rate during the procedure. This will help to ensure that good images are obtained.  You will be asked to lie on an exam table. This table will slide in and out of the CT machine during the procedure.  Contrast dye will be injected into the IV. You might feel warm, or you may get a metallic taste in your mouth.  You will be given a medicine called nitroglycerin. This will relax or dilate the arteries in your heart.  The table that you are lying on will move into the CT machine tunnel for the scan.  The person running the machine will give you instructions while the scans are being done. You may be asked to: ? Keep your arms above your head. ? Hold  your breath. ? Stay very still, even if the table is moving.  When the scanning is complete, you will be moved out of the machine.  The IV will be removed. The procedure may vary among health care providers and hospitals. What can I expect after the procedure? After your procedure, it is common to have:  A metallic taste in your mouth from the contrast dye.  A feeling of warmth.  A headache from the nitroglycerin. Follow these instructions at home:  Take over-the-counter and prescription medicines only as told by your health care provider.  If you are told, drink enough fluid to keep your urine pale yellow. This will help to flush the contrast dye out of  your body.  Most people can return to their normal activities right after the procedure. Ask your health care provider what activities are safe for you.  It is up to you to get the results of your procedure. Ask your health care provider, or the department that is doing the procedure, when your results will be ready.  Keep all follow-up visits as told by your health care provider. This is important. Contact a health care provider if:  You have any symptoms of allergy to the contrast dye. These include: ? Shortness of breath. ? Rash or hives. ? A racing heartbeat. Summary  A cardiac CT angiogram is a procedure to look at the heart and the area around the heart. It may be done to help find the cause of chest pains or other symptoms of heart disease.  During this procedure, a large X-ray machine, called a CT scanner, takes detailed pictures of the heart and the surrounding area after a contrast dye has been injected into blood vessels in the area.  Ask your health care provider about changing or stopping your regular medicines before the procedure. This is especially important if you are taking diabetes medicines, blood thinners, or medicines to treat erectile dysfunction.  If you are told, drink enough fluid to keep your urine  pale yellow. This will help to flush the contrast dye out of your body. This information is not intended to replace advice given to you by your health care provider. Make sure you discuss any questions you have with your health care provider. Document Revised: 04/06/2019 Document Reviewed: 04/06/2019 Elsevier Patient Education  [place].

## 2020-05-04 DIAGNOSIS — M5442 Lumbago with sciatica, left side: Secondary | ICD-10-CM | POA: Diagnosis not present

## 2020-05-08 ENCOUNTER — Encounter: Payer: Self-pay | Admitting: Cardiovascular Disease

## 2020-05-17 ENCOUNTER — Telehealth (HOSPITAL_COMMUNITY): Payer: Self-pay | Admitting: Emergency Medicine

## 2020-05-17 ENCOUNTER — Telehealth: Payer: Self-pay | Admitting: Cardiovascular Disease

## 2020-05-17 DIAGNOSIS — Z01812 Encounter for preprocedural laboratory examination: Secondary | ICD-10-CM | POA: Diagnosis not present

## 2020-05-17 DIAGNOSIS — Z13220 Encounter for screening for lipoid disorders: Secondary | ICD-10-CM | POA: Diagnosis not present

## 2020-05-17 DIAGNOSIS — I319 Disease of pericardium, unspecified: Secondary | ICD-10-CM | POA: Diagnosis not present

## 2020-05-17 DIAGNOSIS — R079 Chest pain, unspecified: Secondary | ICD-10-CM | POA: Diagnosis not present

## 2020-05-17 NOTE — Telephone Encounter (Signed)
Spoke with patient who has CT scheduled tomorrow (was just called yesterday to set it up) She will come today (fasting) for labs Reviewed with her how to take prednisone and times and confirmed she has one-time dose of metoprolol also

## 2020-05-17 NOTE — Telephone Encounter (Signed)
Reaching out to patient to offer assistance regarding upcoming cardiac imaging study; pt verbalizes understanding of appt date/time, parking situation and where to check in, pre-test NPO status and medications ordered, and verified current allergies; name and call back number provided for further questions should they arise Marchia Bond RN Navigator Cardiac Imaging [name] [phone] office [phone] cell  Prednisone 9p, 3am, 9am  Benadryl 9am Metoprolol tartrate 8am  Pt verbalized understanding . Appreciated he phone call

## 2020-05-17 NOTE — Telephone Encounter (Signed)
New message   Pt called in and is confused about her instruction prior to her CT. Pt CT is tomorrow   She has some fu question about when to do the labs and when to take the prednisone.

## 2020-05-18 ENCOUNTER — Ambulatory Visit (HOSPITAL_COMMUNITY)
Admission: RE | Admit: 2020-05-18 | Discharge: 2020-05-18 | Disposition: A | Discharge status: Home / Self Care | Payer: BC Managed Care – PPO | Source: Ambulatory Visit | Attending: Cardiovascular Disease | Admitting: Cardiovascular Disease

## 2020-05-18 DIAGNOSIS — I319 Disease of pericardium, unspecified: Secondary | ICD-10-CM | POA: Diagnosis not present

## 2020-05-18 DIAGNOSIS — R079 Chest pain, unspecified: Secondary | ICD-10-CM | POA: Insufficient documentation

## 2020-05-18 LAB — COMPREHENSIVE METABOLIC PANEL
ALT: 17 IU/L (ref 0–32)
AST: 15 IU/L (ref 0–40)
Albumin/Globulin Ratio: 2 (ref 1.2–2.2)
Albumin: 4.8 g/dL (ref 3.8–4.8)
Alkaline Phosphatase: 70 IU/L (ref 44–121)
BUN/Creatinine Ratio: 22 (ref 9–23)
BUN: 18 mg/dL (ref 6–24)
Bilirubin Total: 0.5 mg/dL (ref 0.0–1.2)
CO2: 24 mmol/L (ref 20–29)
Calcium: 9.9 mg/dL (ref 8.7–10.2)
Chloride: 102 mmol/L (ref 96–106)
Creatinine, Ser: 0.81 mg/dL (ref 0.57–1.00)
GFR calc Af Amer: 99 mL/min/{1.73_m2} (ref >59)
GFR calc non Af Amer: 86 mL/min/{1.73_m2} (ref >59)
Globulin, Total: 2.4 g/dL (ref 1.5–4.5)
Glucose: 84 mg/dL (ref 65–99)
Potassium: 3.8 mmol/L (ref 3.5–5.2)
Sodium: 140 mmol/L (ref 134–144)
Total Protein: 7.2 g/dL (ref 6.0–8.5)

## 2020-05-18 LAB — LIPID PANEL
Chol/HDL Ratio: 3.3 ratio (ref 0.0–4.4)
Cholesterol, Total: 221 mg/dL — ABNORMAL HIGH (ref 100–199)
HDL: 68 mg/dL (ref >39)
LDL Chol Calc (NIH): 143 mg/dL — ABNORMAL HIGH (ref 0–99)
Triglycerides: 57 mg/dL (ref 0–149)
VLDL Cholesterol Cal: 10 mg/dL (ref 5–40)

## 2020-05-18 LAB — SEDIMENTATION RATE: Sed Rate: 11 mm/hr (ref 0–32)

## 2020-05-18 MED ORDER — IOHEXOL 350 MG/ML SOLN
75.0000 mL | Freq: Once | INTRAVENOUS | Status: AC | PRN
  Administered 2020-05-18: 75 mL via INTRAVENOUS

## 2020-05-18 MED ORDER — NITROGLYCERIN 0.4 MG SL SUBL: 0.8000 mg | SUBLINGUAL_TABLET | Freq: Once | SUBLINGUAL | Status: AC

## 2020-05-18 MED ORDER — NITROGLYCERIN 0.4 MG SL SUBL
SUBLINGUAL_TABLET | SUBLINGUAL | Status: AC
  Administered 2020-05-18: 0.8 mg via SUBLINGUAL
  Filled 2020-05-18: qty 2

## 2020-05-21 ENCOUNTER — Telehealth: Payer: Self-pay | Admitting: *Deleted

## 2020-05-21 DIAGNOSIS — E7800 Pure hypercholesterolemia, unspecified: Secondary | ICD-10-CM

## 2020-05-21 NOTE — Telephone Encounter (Signed)
Released in my chart with Dr Blenda Mounts comments attached   Orders for lipids placed

## 2020-05-21 NOTE — Telephone Encounter (Signed)
-----   Message from Skeet Latch, MD sent at 05/21/2020  3:29 PM EDT ----- Kidney function, liver function, and electrolytes are normal.  Cholesterol levels are elevated.  Let us really work on diet and exercise so that we do not need to add any medications.  She needs 150 minutes of exercise weekly and to limit fried foods, fatty foods, red meat, and cheese.  Repeat lipids the week prior to her follow-up appointment.

## 2020-10-30 IMAGING — MG MM DIGITAL DIAGNOSTIC UNILAT*L* W/ TOMO W/ CAD
4 series · 4 of 12 positions shown · non-contrast
Comparison: Previous exam(s).

CLINICAL DATA: Follow-up for probably benign apocrine metaplasia
within the LEFT breast. This was originally identified on a
screening mammogram dated 03/07/2019.

EXAM:
DIGITAL DIAGNOSTIC LEFT MAMMOGRAM WITH CAD AND TOMO
ULTRASOUND LEFT BREAST

[L MLO synth-2D]
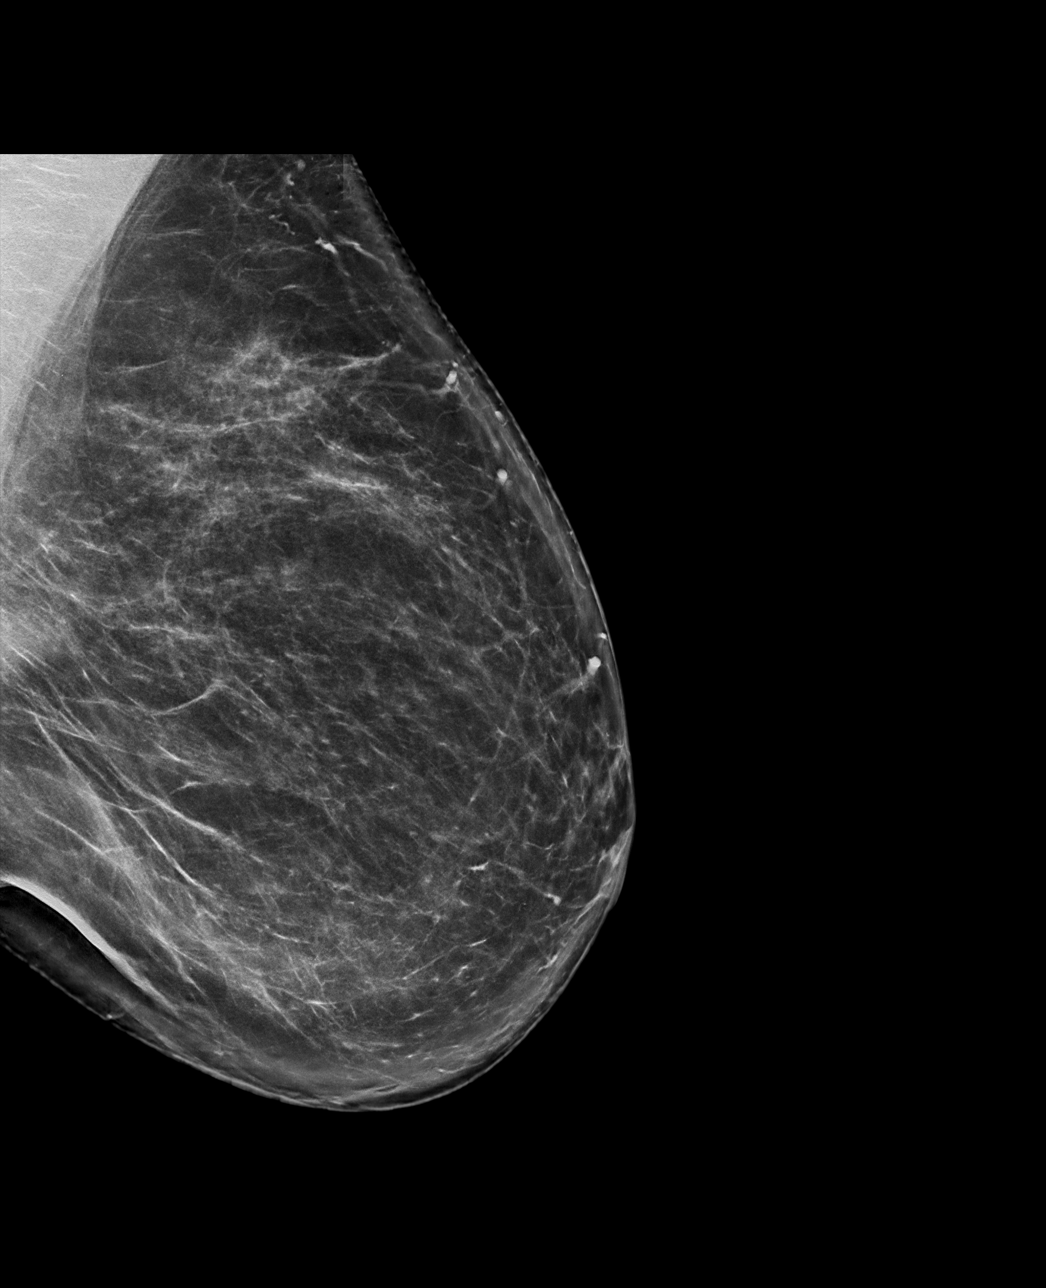

[L CC synth-2D]
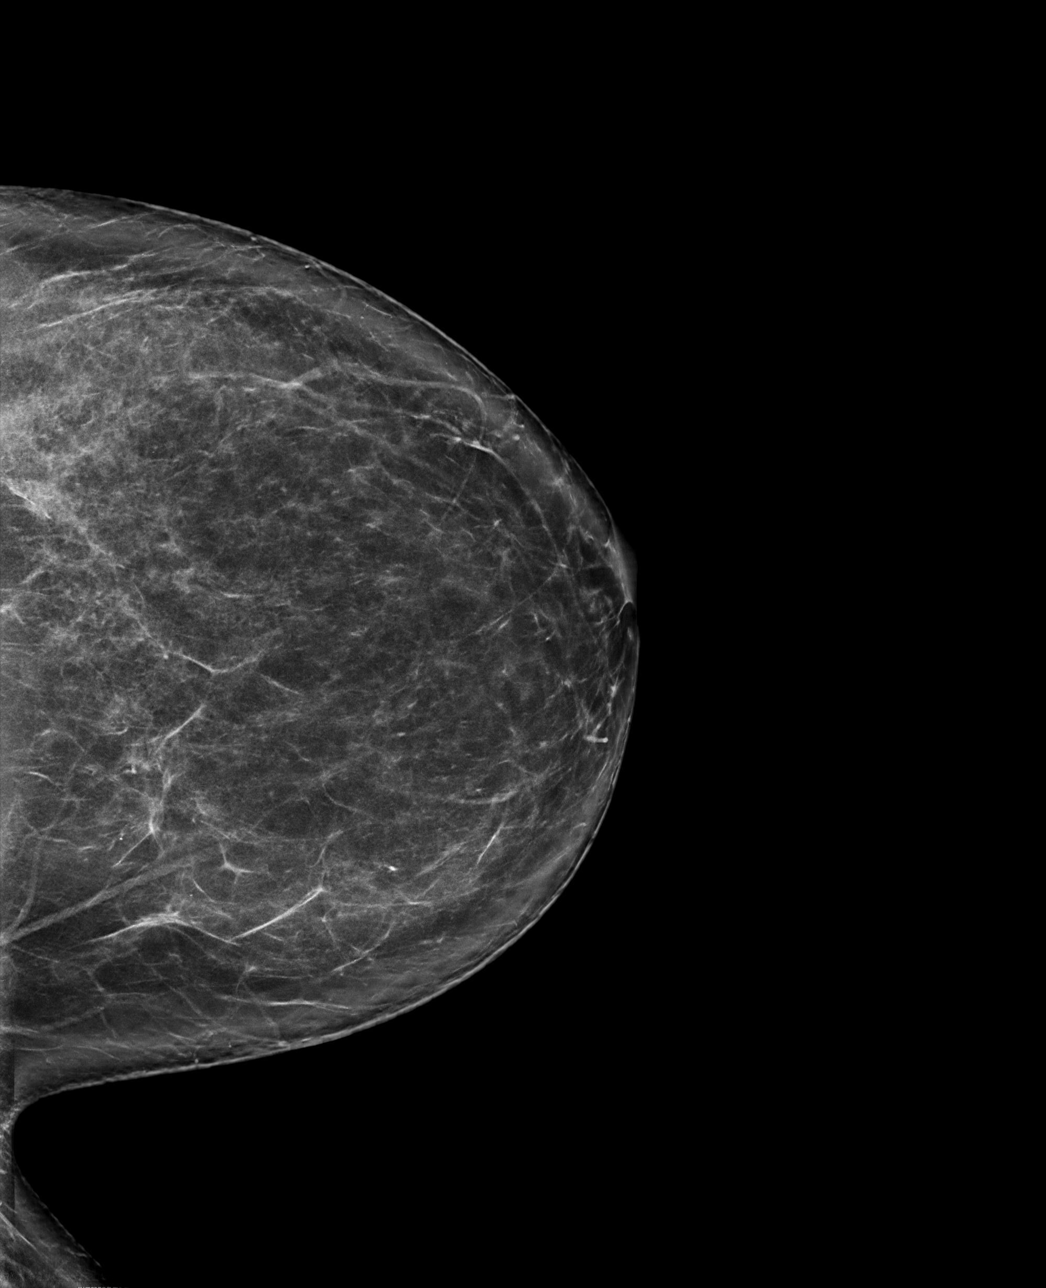

[L MLO tomo · tomo slice 48/95.0]
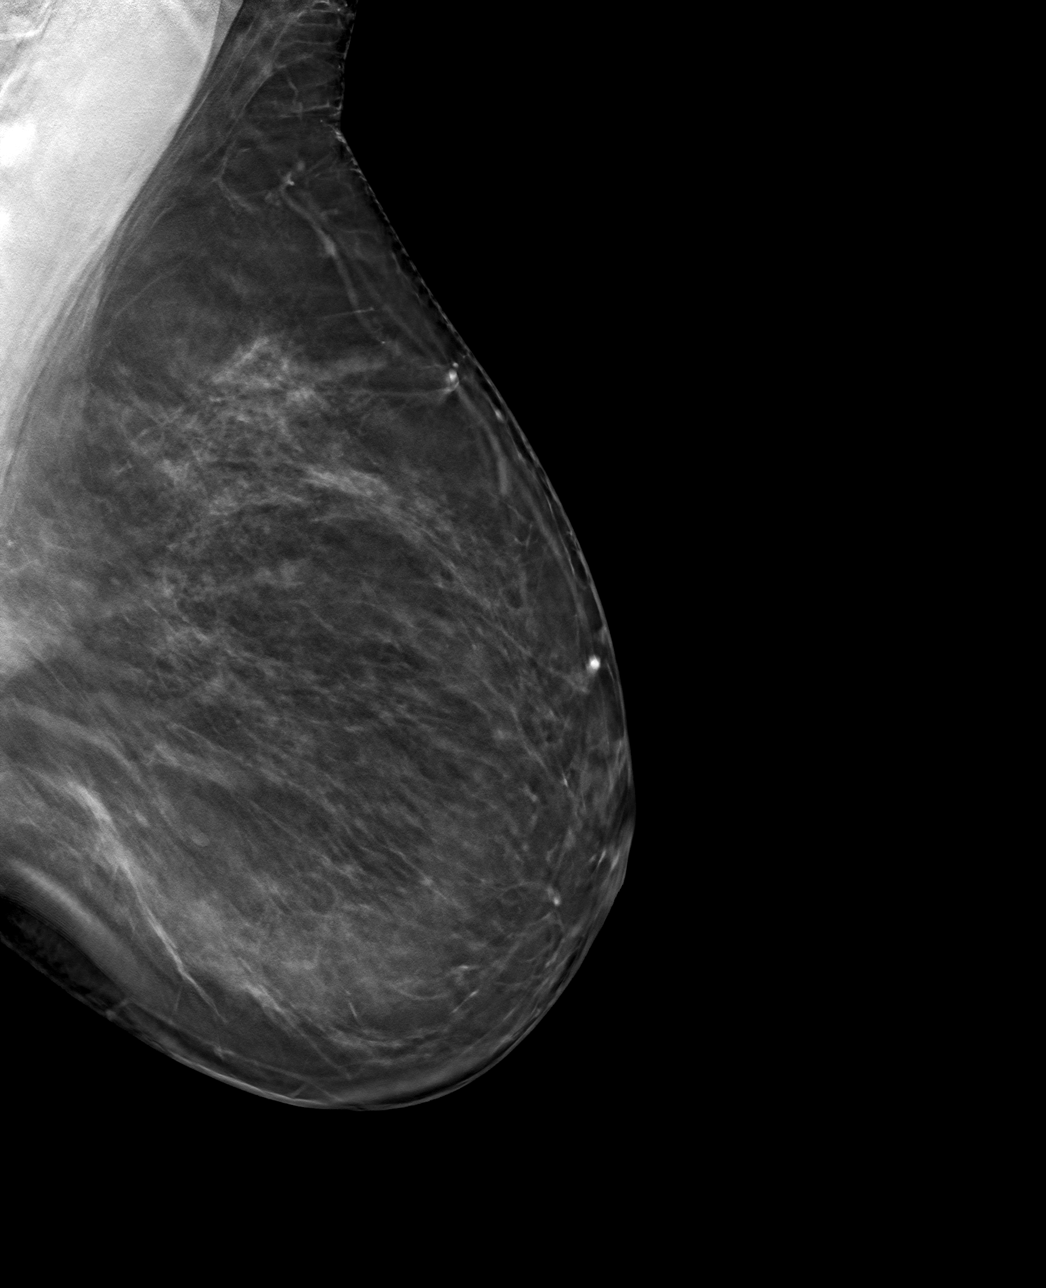

[L CC tomo · tomo slice 45/90.0]
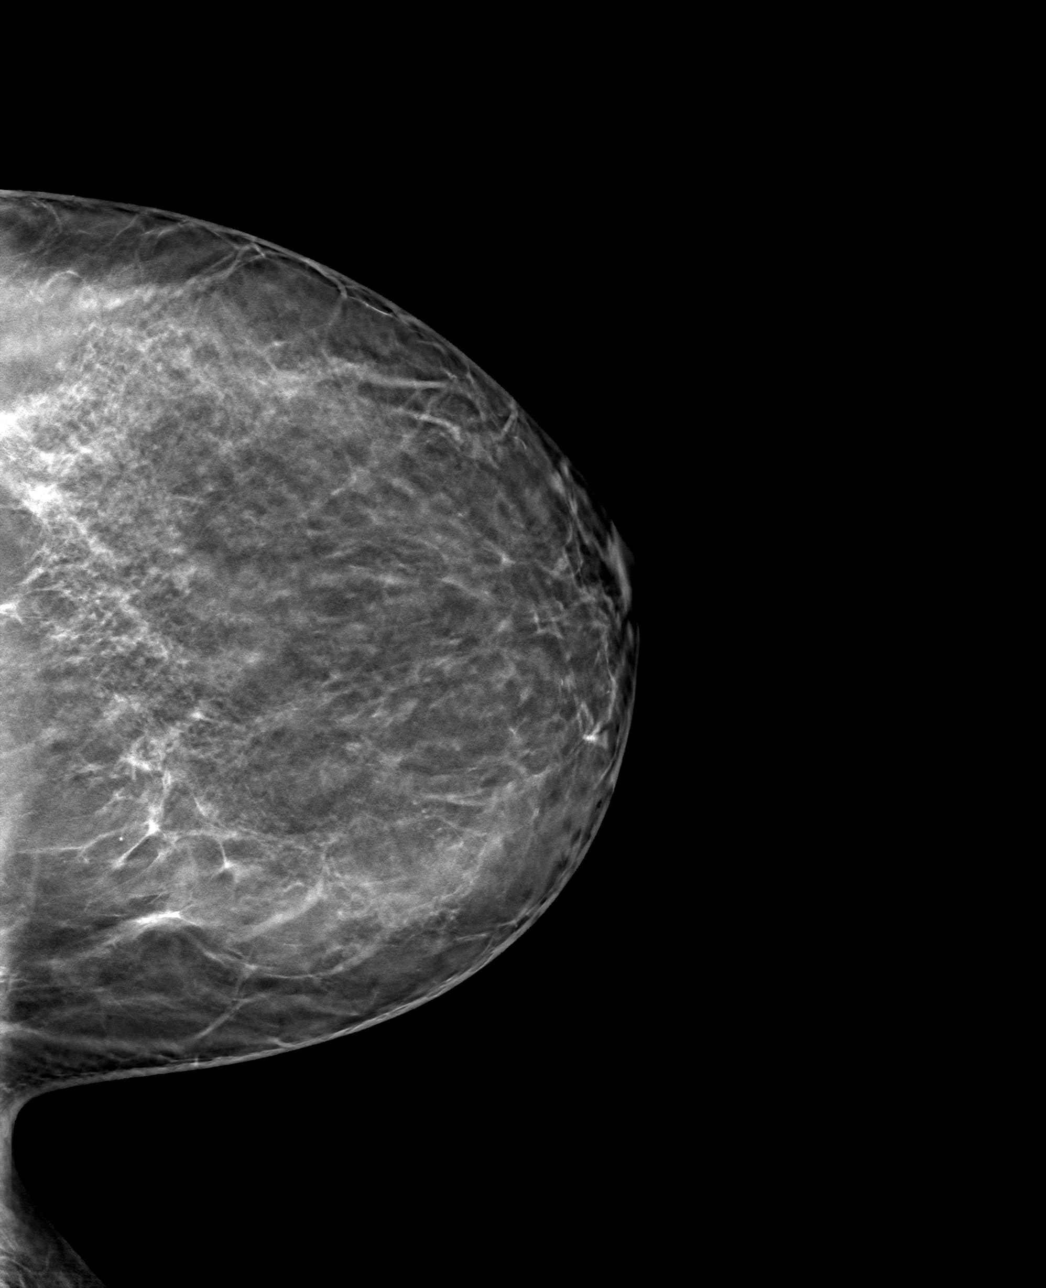

[4 of 12 positions shown; findings below may reference images not displayed]

ACR Breast Density Category b: There are scattered areas of
fibroglandular density.
FINDINGS: There is a persistent probably benign mass within the slightly
lateral LEFT breast, at posterior depth, measuring approximately 4
mm greatest dimension, CC slice 56. There are no new dominant
masses, suspicious calcifications or secondary signs of malignancy
within the LEFT breast.

Mammographic images were processed with CAD.

Targeted ultrasound is performed, showing no sonographic correlate
for the mammographic finding. The probably benign cluster of cysts
(apocrine metaplasia) demonstrated on the previous ultrasound is no
longer seen confirming benignity.
IMPRESSION: 1. Probably benign mass within the slightly outer LEFT breast, at
posterior depth, measuring approximately 4 mm greatest dimension.
Recommend follow-up LEFT breast diagnostic mammogram in February 2020 to
ensure continued stability.
2. Interval resolution of the probably benign cluster of cysts
(apocrine metaplasia) demonstrated on the previous ultrasound,
confirming benignity.

RECOMMENDATION:
LEFT breast diagnostic mammogram, and possible ultrasound, in 6
months.

I have discussed the findings and recommendations with the patient.
If applicable, a reminder letter will be sent to the patient
regarding the next appointment.

BI-RADS CATEGORY  3: Probably benign.

## 2020-10-30 IMAGING — US US BREAST*L* LIMITED INC AXILLA
1 series · 2 of 2 positions shown · non-contrast
Comparison: Previous exam(s).

CLINICAL DATA: Follow-up for probably benign apocrine metaplasia
within the LEFT breast. This was originally identified on a
screening mammogram dated 03/07/2019.

EXAM:
DIGITAL DIAGNOSTIC LEFT MAMMOGRAM WITH CAD AND TOMO
ULTRASOUND LEFT BREAST

[Series 1: us breast*left* limited inc axilla · 0.07mm/px · 2 of 2 slices shown]
[im 1/2]
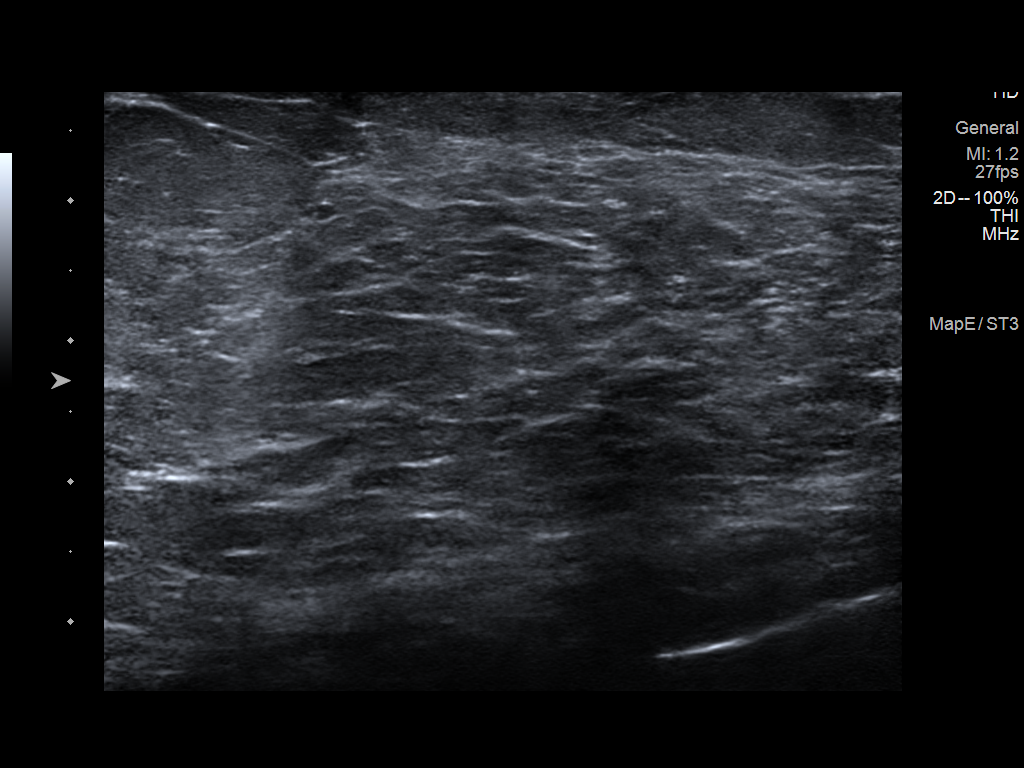
[im 2/2]
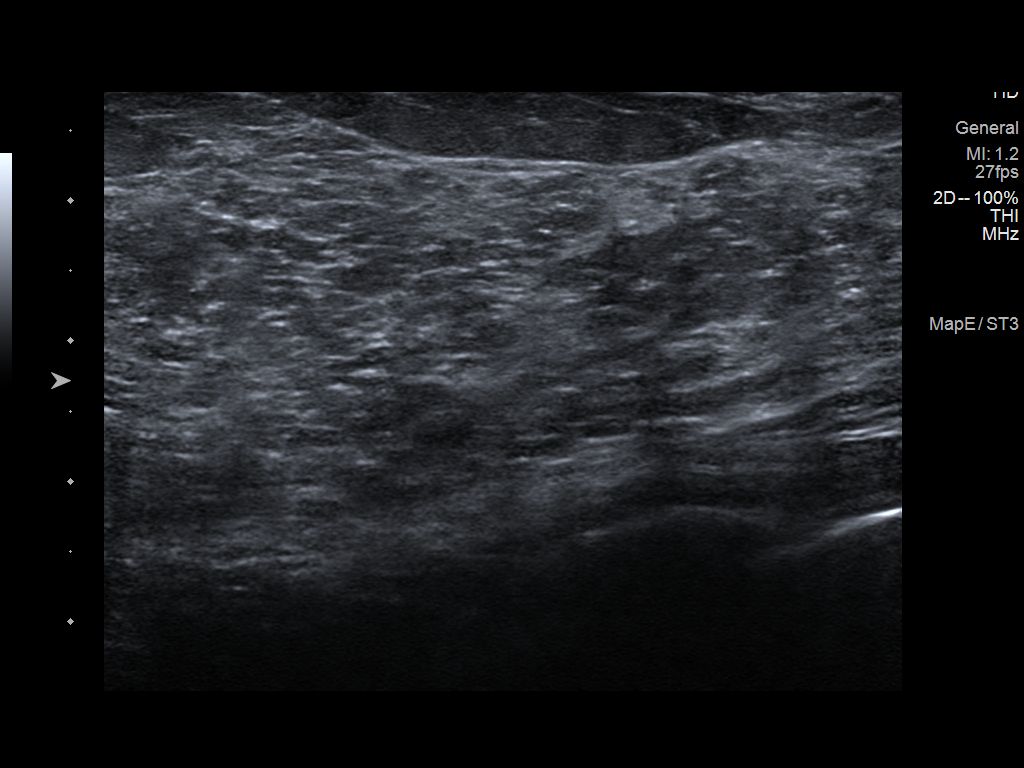

[2 of 2 positions shown; findings below may reference images not displayed]

ACR Breast Density Category b: There are scattered areas of
fibroglandular density.
FINDINGS: There is a persistent probably benign mass within the slightly
lateral LEFT breast, at posterior depth, measuring approximately 4
mm greatest dimension, CC slice 56. There are no new dominant
masses, suspicious calcifications or secondary signs of malignancy
within the LEFT breast.

Mammographic images were processed with CAD.

Targeted ultrasound is performed, showing no sonographic correlate
for the mammographic finding. The probably benign cluster of cysts
(apocrine metaplasia) demonstrated on the previous ultrasound is no
longer seen confirming benignity.
IMPRESSION: 1. Probably benign mass within the slightly outer LEFT breast, at
posterior depth, measuring approximately 4 mm greatest dimension.
Recommend follow-up LEFT breast diagnostic mammogram in February 2020 to
ensure continued stability.
2. Interval resolution of the probably benign cluster of cysts
(apocrine metaplasia) demonstrated on the previous ultrasound,
confirming benignity.

RECOMMENDATION:
LEFT breast diagnostic mammogram, and possible ultrasound, in 6
months.

I have discussed the findings and recommendations with the patient.
If applicable, a reminder letter will be sent to the patient
regarding the next appointment.

BI-RADS CATEGORY  3: Probably benign.

## 2020-11-05 DIAGNOSIS — R079 Chest pain, unspecified: Secondary | ICD-10-CM | POA: Diagnosis not present

## 2020-11-05 DIAGNOSIS — I32 Pericarditis in diseases classified elsewhere: Secondary | ICD-10-CM | POA: Diagnosis not present

## 2020-11-05 DIAGNOSIS — R768 Other specified abnormal immunological findings in serum: Secondary | ICD-10-CM | POA: Diagnosis not present

## 2020-11-07 ENCOUNTER — Other Ambulatory Visit: Payer: Self-pay

## 2020-11-07 ENCOUNTER — Encounter: Payer: Self-pay | Admitting: Cardiovascular Disease

## 2020-11-07 ENCOUNTER — Ambulatory Visit: Payer: BC Managed Care – PPO | Admitting: Cardiovascular Disease

## 2020-11-07 VITALS — BP 120/82 | HR 64 | Ht 66.5 in | Wt 190.4 lb

## 2020-11-07 DIAGNOSIS — I214 Non-ST elevation (NSTEMI) myocardial infarction: Secondary | ICD-10-CM | POA: Diagnosis not present

## 2020-11-07 DIAGNOSIS — E669 Obesity, unspecified: Secondary | ICD-10-CM | POA: Diagnosis not present

## 2020-11-07 DIAGNOSIS — R071 Chest pain on breathing: Secondary | ICD-10-CM

## 2020-11-07 DIAGNOSIS — E66811 Obesity, class 1: Secondary | ICD-10-CM

## 2020-11-07 DIAGNOSIS — Z6830 Body mass index (BMI) 30.0-30.9, adult: Secondary | ICD-10-CM

## 2020-11-07 HISTORY — DX: Obesity, class 1: E66.811

## 2020-11-07 HISTORY — DX: Obesity, unspecified: E66.9

## 2020-11-07 NOTE — Progress Notes (Signed)
Cardiology Office Note  Date:  11/07/2020   ID:  [name], DOB 09-26-1970, MRN [id number]  PCP:  Ileana Ladd, MD  Cardiologist:   Chilton Si, MD   No chief complaint on file.  History of Present Illness: [name] is a 50 y.o. female with pericarditis and hyperlipidemia who presents for follow up. [name] was admitted 04/2018 with chest pain. She has a history of palpitations that she attributes to stress at work. 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 However, for the week before she went to the hospital she noted feeling more anxious. She had no URI at the time. She was seen in the emergency department where EKG was initially concerning for STEMI. Troponin was elevated to 0.75 and ESR was 30. She was thought to have myopericarditis and was started on ibuprofen and colchicine. She was also given a prescription for oxycodone. She followed up in clinic 9/12 and was doing better but still had some pleuritic chest pain. She started to become more active but developed recurrent chest pain. She had a repeat echocardiogram 05/14/2018 that revealed LVEF 60 to 65% with no pericardial effusion. She did have a left pleural effusion that was unchanged from the hospital. She followed up with her rheumatologist, Dr. Zenovia Jordan, 04/2018. She was noted to have a mildly positive rheumatoid factor, ESR, and CRP. She tried tapering her ibuprofen and had recurrent pain after decreasing to twice a day. It was decided that she would resume both ibuprofen and colchicine for the holidays.   [name] was seen by Kimberly Reining, DNP on 05/2019.  She was doing well at that time.  She saw her rheumatologist and started back on colchicine, ibuprofen and nebivolol.  It has subsequently been discontinued.  Overall she has felt well physically.  She had an episode of chest tightness that occurred with stress.  She had a coronary CT-A  04/2020 that revealed no coronary calcium and no CAD.  She denies any exertional chest pain or pressure.  She has not been getting as much exercise lately.  She has been under a lot of stress at work.  She denies any lower extremity edema, orthopnea, or PND.  She is frustrated because she lost some weight but then gained it all back.  She notes that she stress eats.   Past Medical History:  Diagnosis Date  . Acne   . Elevated troponin 04/28/2018  . Endometriosis   . HELLP (hemolytic anemia/elev liver enzymes/low platelets in pregnancy)    hx of two pregnancies  . Miscarriage   . Normal spontaneous vaginal delivery   . Obesity (BMI 30.0-34.9) 11/07/2020  . Thyroid dysfunction     Past Surgical History:  Procedure Laterality Date  . BILATERAL SALPINGECTOMY Right 10/16/2015   Procedure: RIGHT  SALPINGECTOMY;  Surgeon: Ok Edwards, MD;  Location: [facility];  Service: Gynecology;  Laterality: Right;  . BUNIONECTOMY Right 11/30/2015   @ [facility]  . BUNIONECTOMY Left 12/21/2015   @ [facility]  . BUNIONECTOMY Left 03/07/2016   @[facility]    . CESAREAN SECTION    . CYSTOSCOPY N/A 10/16/2015   Procedure: CYSTOSCOPY;  Surgeon: Ok Edwards, MD;  Location: [facility];  Service: Gynecology;  Laterality: N/A;  . DILATION AND CURETTAGE OF UTERUS    . LAPAROSCOPIC VAGINAL HYSTERECTOMY WITH SALPINGO OOPHORECTOMY Left 10/16/2015   Procedure: LAPAROSCOPIC ASSISTED VAGINAL HYSTERECTOMY WITH SALPINGO OOPHORECTOMY;  Surgeon: Ok Edwards, MD;  Location: [facility];  Service: Gynecology;  Laterality: Left;  . TUBAL LIGATION       Current Outpatient Medications  Medication Sig Dispense Refill  . Cholecalciferol (VITAMIN D3) 50 MCG (2000 UT) TABS Take by mouth.    . Cyanocobalamin (VITAMIN B-12 PO) Take 1 tablet by mouth daily.    . Multiple Vitamin (MULTIVITAMIN WITH MINERALS) TABS tablet Take 1 tablet by mouth daily.     No current facility-administered medications for this visit.    Allergies:   Iodinated diagnostic  agents, Iodine, Other, and Shellfish allergy    Social History:  The patient  reports that she has never smoked. She has never used smokeless tobacco. She reports current alcohol use. She reports that she does not use drugs.   Family History:  [name] family history includes Cancer in her brother and mother; Cataracts in her father; Diabetes in her brother, maternal grandmother, and mother; Heart disease in her maternal grandfather; Heart failure in her maternal grandfather; Hypertension in her mother; Kidney failure in her maternal grandmother; Lymphoma in her father; Sickle cell anemia in her maternal aunt; Stroke in her maternal grandfather; Thyroid disease in her mother.    ROS:  Please see the history of present illness.   Otherwise, review of systems are positive for none.   All other systems are reviewed and negative.    PHYSICAL EXAM: VS:  BP 120/82   Pulse 64   Ht 5' 6.5" (1.689 m)   Wt 190 lb 6.4 oz (86.4 kg)   LMP 09/11/2015   SpO2 95%   BMI 30.27 kg/m  , BMI Body mass index is 30.27 kg/m. GENERAL:  Well appearing HEENT: Pupils equal round and reactive, fundi not visualized, oral mucosa unremarkable NECK:  No jugular venous distention, waveform within normal limits, carotid upstroke brisk and symmetric, no bruits LUNGS:  Clear to auscultation bilaterally HEART:  RRR.  PMI not displaced or sustained,S1 and S2 within normal limits, no S3, no S4, no clicks, no rubs, no murmurs ABD:  Flat, positive bowel sounds normal in frequency in pitch, no bruits, no rebound, no guarding, no midline pulsatile mass, no hepatomegaly, no splenomegaly EXT:  2 plus pulses throughout, no edema, no cyanosis no clubbing SKIN:  No rashes no nodules NEURO:  Cranial nerves II through XII grossly intact, motor grossly intact throughout PSYCH:  Cognitively intact, oriented to person place and time  EKG:  EKG is ordered today. 05/10/18: Sinus rhythm.  Rate 80 bpm.  Mind inferior, anterior and  lateral ST elevation.  PVC 05/03/20: Sinus rhythm. Rate 65 bpm.  11/07/2020: Sinus rhythm.  Rate 65 bpm.  First-degree AV block.  LVH with secondary repolarization abnormality.  LAFB.  Echo 04/29/18: Study Conclusions  - Left ventricle: The cavity size was normal. Wall thickness was   increased in a pattern of mild LVH. Systolic function was normal.   The estimated ejection fraction was in the range of 60% to 65%.   Wall motion was normal; there were no regional wall motion   abnormalities. Features are consistent with a pseudonormal left   ventricular filling pattern, with concomitant abnormal relaxation   and increased filling pressure (grade 2 diastolic dysfunction). - Pulmonary arteries: Systolic pressure was mildly increased. PA   peak pressure: 39 mm Hg (S). - Pericardium, extracardiac: A trivial pericardial effusion was   identified.  Echo 05/14/18: Study Conclusions  - Left ventricle: The cavity size was normal. There was mild   concentric hypertrophy. Systolic function  was normal. The   estimated ejection fraction was in the range of 60% to 65%. Wall   motion was normal; there were no regional wall motion   abnormalities. Left ventricular diastolic function parameters   were normal. - Aortic valve: There was no regurgitation. - Left atrium: The atrium was normal in size. - Right ventricle: Systolic function was normal. - Right atrium: The atrium was normal in size. - Tricuspid valve: There was mild regurgitation. - Pulmonary arteries: Systolic pressure was within the normal   range. - Inferior vena cava: The vessel was normal in size. - Pericardium, extracardiac: There was no pericardial effusion.   There was a left pleural effusion.  Impressions:  - There is no significant change since the prior study on 04/28/2018,   there is no pericardial effusion and stable left pleural   effusion.  Recent Labs: 05/17/2020: ALT 17; BUN 18; Creatinine, Ser 0.81; Potassium 3.8;  Sodium 140    Lipid Panel    Component Value Date/Time   CHOL 221 (H) 05/17/2020 1527   TRIG 57 05/17/2020 1527   HDL 68 05/17/2020 1527   CHOLHDL 3.3 05/17/2020 1527   CHOLHDL 2.1 04/28/2018 0047   VLDL 8 04/28/2018 0047   LDLCALC 143 (H) 05/17/2020 1527   LDLCALC 100 (H) 11/26/2017 0843      Wt Readings from Last 3 Encounters:  11/07/20 190 lb 6.4 oz (86.4 kg)  05/03/20 190 lb (86.2 kg)  12/05/19 181 lb (82.1 kg)      ASSESSMENT AND PLAN:  # Myopericarditis: Resolved.  Her chest pain is atypical and more related to anxiety and cervical disc disease.  We discussed stress reduction techniques and counselling. There was no residual pericardial effusion on echo 04/2018.  We will check an ESR to make sure there is no evidence of recurrent inflammation.  Coronary CT-A to ensure there is no obstructive CAD and for risk stratification.  Hopefully this will reassure her its OK ot exercise again.  She requires pre-treatment for contrast allergy..  # Atypical chest pain:  Symptoms are atypical.  Her L arm and leg pain are attributable to cervical disc disease.  Coronary CT-A was negative for CAD on 04/2020.  # Palpitations: Improved on nebivolol but she stopped it.  It also seemed to help anxiety.   Current medicines are reviewed at length with the patient today.  The patient does not have concerns regarding medicines.  The following changes have been made:  none  Labs/ tests ordered today include:   Orders Placed This Encounter  Procedures  . Ambulatory referral to [facility]  As needed. Disposition:   FU with Welden Hausmann C. Duke Salvia, MD, [facility]   Signed, Kc Summerson C. Duke Salvia, MD, [facility]  11/07/2020 4:46 PM    [facility]

## 2020-11-07 NOTE — Patient Instructions (Addendum)
Medication Instructions:  Your physician recommends that you continue on your current medications as directed. Please refer to the Current Medication list given to you today.   Labwork: none  Testing/Procedures: none  Follow-Up: As needed   Any Other Special Instructions Will Be Listed Below (If Applicable).  You have been referred to [facility]   Where: [facility] Address: [place] [address] Phone: [phone]   If you do not hear from them in 2 weeks you can call them directly at the number above

## 2020-12-06 ENCOUNTER — Encounter: Payer: BC Managed Care – PPO | Admitting: Obstetrics & Gynecology

## 2020-12-18 ENCOUNTER — Other Ambulatory Visit: Payer: Self-pay

## 2020-12-18 ENCOUNTER — Encounter (INDEPENDENT_AMBULATORY_CARE_PROVIDER_SITE_OTHER): Payer: Self-pay | Admitting: Family Medicine

## 2020-12-18 ENCOUNTER — Ambulatory Visit (INDEPENDENT_AMBULATORY_CARE_PROVIDER_SITE_OTHER): Payer: BC Managed Care – PPO | Admitting: Family Medicine

## 2020-12-18 VITALS — BP 112/74 | HR 65 | Temp 97.5°F | Ht 66.0 in | Wt 192.0 lb

## 2020-12-18 DIAGNOSIS — R5383 Other fatigue: Secondary | ICD-10-CM

## 2020-12-18 DIAGNOSIS — Z6831 Body mass index (BMI) 31.0-31.9, adult: Secondary | ICD-10-CM

## 2020-12-18 DIAGNOSIS — E559 Vitamin D deficiency, unspecified: Secondary | ICD-10-CM | POA: Diagnosis not present

## 2020-12-18 DIAGNOSIS — Z9189 Other specified personal risk factors, not elsewhere classified: Secondary | ICD-10-CM | POA: Diagnosis not present

## 2020-12-18 DIAGNOSIS — Z0289 Encounter for other administrative examinations: Secondary | ICD-10-CM

## 2020-12-18 DIAGNOSIS — R0602 Shortness of breath: Secondary | ICD-10-CM | POA: Diagnosis not present

## 2020-12-18 DIAGNOSIS — R7301 Impaired fasting glucose: Secondary | ICD-10-CM | POA: Diagnosis not present

## 2020-12-18 DIAGNOSIS — E538 Deficiency of other specified B group vitamins: Secondary | ICD-10-CM | POA: Diagnosis not present

## 2020-12-18 DIAGNOSIS — E782 Mixed hyperlipidemia: Secondary | ICD-10-CM

## 2020-12-18 DIAGNOSIS — Z1331 Encounter for screening for depression: Secondary | ICD-10-CM

## 2020-12-18 DIAGNOSIS — E669 Obesity, unspecified: Secondary | ICD-10-CM

## 2020-12-19 LAB — COMPREHENSIVE METABOLIC PANEL
ALT: 24 IU/L (ref 0–32)
AST: 21 IU/L (ref 0–40)
Albumin/Globulin Ratio: 1.6 (ref 1.2–2.2)
Albumin: 4.4 g/dL (ref 3.8–4.8)
Alkaline Phosphatase: 81 IU/L (ref 44–121)
BUN/Creatinine Ratio: 17 (ref 9–23)
BUN: 14 mg/dL (ref 6–24)
Bilirubin Total: 0.4 mg/dL (ref 0.0–1.2)
CO2: 22 mmol/L (ref 20–29)
Calcium: 9.3 mg/dL (ref 8.7–10.2)
Chloride: 103 mmol/L (ref 96–106)
Creatinine, Ser: 0.84 mg/dL (ref 0.57–1.00)
Globulin, Total: 2.8 g/dL (ref 1.5–4.5)
Glucose: 70 mg/dL (ref 65–99)
Potassium: 4.1 mmol/L (ref 3.5–5.2)
Sodium: 142 mmol/L (ref 134–144)
Total Protein: 7.2 g/dL (ref 6.0–8.5)
eGFR: 85 mL/min/{1.73_m2} (ref >59)

## 2020-12-19 LAB — FOLATE: Folate: 14.2 ng/mL (ref >3.0)

## 2020-12-19 LAB — CBC WITH DIFFERENTIAL
Basophils Absolute: 0.1 10*3/uL (ref 0.0–0.2)
Basos: 1 %
EOS (ABSOLUTE): 0.3 10*3/uL (ref 0.0–0.4)
Eos: 3 %
Hematocrit: 41.9 % (ref 34.0–46.6)
Hemoglobin: 13.9 g/dL (ref 11.1–15.9)
Immature Grans (Abs): 0 10*3/uL (ref 0.0–0.1)
Immature Granulocytes: 0 %
Lymphocytes Absolute: 3.1 10*3/uL (ref 0.7–3.1)
Lymphs: 37 %
MCH: 31.4 pg (ref 26.6–33.0)
MCHC: 33.2 g/dL (ref 31.5–35.7)
MCV: 95 fL (ref 79–97)
Monocytes Absolute: 0.6 10*3/uL (ref 0.1–0.9)
Monocytes: 8 %
Neutrophils Absolute: 4.2 10*3/uL (ref 1.4–7.0)
Neutrophils: 51 %
RBC: 4.43 x10E6/uL (ref 3.77–5.28)
RDW: 13 % (ref 11.7–15.4)
WBC: 8.3 10*3/uL (ref 3.4–10.8)

## 2020-12-19 LAB — LIPID PANEL WITH LDL/HDL RATIO
Cholesterol, Total: 220 mg/dL — ABNORMAL HIGH (ref 100–199)
HDL: 71 mg/dL (ref >39)
LDL Chol Calc (NIH): 138 mg/dL — ABNORMAL HIGH (ref 0–99)
LDL/HDL Ratio: 1.9 ratio (ref 0.0–3.2)
Triglycerides: 65 mg/dL (ref 0–149)
VLDL Cholesterol Cal: 11 mg/dL (ref 5–40)

## 2020-12-19 LAB — T3: T3, Total: 113 ng/dL (ref 71–180)

## 2020-12-19 LAB — HEMOGLOBIN A1C
Est. average glucose Bld gHb Est-mCnc: 114 mg/dL
Hgb A1c MFr Bld: 5.6 % (ref 4.8–5.6)

## 2020-12-19 LAB — INSULIN, RANDOM: INSULIN: 10.6 u[IU]/mL (ref 2.6–24.9)

## 2020-12-19 LAB — VITAMIN D 25 HYDROXY (VIT D DEFICIENCY, FRACTURES): Vit D, 25-Hydroxy: 42.9 ng/mL (ref 30.0–100.0)

## 2020-12-19 LAB — T4: T4, Total: 6.9 ug/dL (ref 4.5–12.0)

## 2020-12-19 LAB — TSH: TSH: 0.41 u[IU]/mL — ABNORMAL LOW (ref 0.450–4.500)

## 2020-12-19 LAB — VITAMIN B12: Vitamin B-12: 848 pg/mL (ref 232–1245)

## 2020-12-19 NOTE — Progress Notes (Signed)
Dear Dr. Oval Linsey,   Thank you for referring [name] to our clinic. The following note includes my evaluation and treatment recommendations.  Chief Complaint:   OBESITY [name] (MR# [id number]) is a 50 y.o. female who presents for evaluation and treatment of obesity and related comorbidities. Current BMI is Body mass index is 30.99 kg/m. [name] has been struggling with her weight for many years and has been unsuccessful in either losing weight, maintaining weight loss, or reaching her healthy weight goal.  [name] is currently in the action stage of change and ready to dedicate time achieving and maintaining a healthier weight. [name] is interested in becoming our patient and working on intensive lifestyle modifications including (but not limited to) diet and exercise for weight loss.  [name] was referred by Dr. Oval Linsey. She is lactose intolerant but tolerates some cheese. 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 She is skipping breakfast, lunch most days due to time and planning. 1/2 tsp creamer in 8 oz coffee; Dinner- spaghetti (with bolognaise + ground beef + italian sausage +  Crushed and sun dried tomatoes) with salad or green beans or broccoli (1 cup) (satisfied).  [name]'s habits were reviewed today and are as follows: Her family eats meals together, she thinks her family will eat healthier with her, her desired weight loss is 42 lbs, she started gaining weight when she started a new position in August 2020, her heaviest weight ever was 192 pounds, she skips meals frequently, she is frequently drinking liquids with calories, she frequently makes poor food choices and she struggles with emotional eating.  Depression Screen [name]'s Food and Mood (modified PHQ-9) score was 10.  Depression screen [facility] 2/9 12/18/2020  Decreased Interest 2  Down, Depressed, Hopeless 3  PHQ - 2 Score 5   Altered sleeping 0  Tired, decreased energy 1  Change in appetite 3  Feeling bad or failure about yourself  0  Trouble concentrating 1  Moving slowly or fidgety/restless 0  Suicidal thoughts 0  PHQ-9 Score 10  Difficult doing work/chores Not difficult at all   Subjective:   1. Other fatigue Datra admits to daytime somnolence and admits to waking up still tired. Patent has a history of symptoms of daytime fatigue and morning fatigue. [name] generally gets 6 hours of sleep per night, and states that she has generally restful sleep. Snoring is present. Apneic episodes are not present. [name] is 10. EKG normal sinus rhythm at 61 bpm.  2. SOB (shortness of breath) on exertion [name] notes increasing shortness of breath with exercising and seems to be worsening over time with weight gain. She notes getting out of breath sooner with activity than she used to. This has gotten worse recently. [name] denies shortness of breath at rest or orthopnea. EKG normal sinus rhythm at 61 bpm.  3. Mixed hyperlipidemia [name]'s LDL in 2019 is showing 78 (previously 119).  4. Vitamin D deficiency [name] is on OTC Vit D  2,000 IU daily. She reports fatigue.  5. Vitamin B12 deficiency [name] is taking a multivitamin daily. She reports fatigue.  6. At risk for deficient intake of food The patient is at a higher than average risk of deficient intake of food.  Assessment/Plan:   1. Other fatigue [name] does feel that her weight is causing her energy to be lower than it should be. Fatigue may be related to obesity, depression or many other  causes. Labs will be ordered, and in the meanwhile, [name] will focus on self care including making healthy food choices, increasing physical activity and focusing on stress reduction. Check labs today.  - Insulin, random - Comprehensive metabolic panel - Hemoglobin A1c - TSH - Folate - T3 - T4 - EKG 12-Lead  2. SOB (shortness of breath) on  exertion [name] does feel that she gets out of breath more easily that she used to when she exercises. [name]'s shortness of breath appears to be obesity related and exercise induced. She has agreed to work on weight loss and gradually increase exercise to treat her exercise induced shortness of breath. Will continue to monitor closely. Check labs today.  - Lipid Panel With LDL/HDL Ratio - CBC With Differential  3. Mixed hyperlipidemia Cardiovascular risk and specific lipid/LDL goals reviewed.  We discussed several lifestyle modifications today and [name] will continue to work on diet, exercise and weight loss efforts. Orders and follow up as documented in patient record. Check labs today.  Counseling Intensive lifestyle modifications are the first line treatment for this issue. . Dietary changes: Increase soluble fiber. Decrease simple carbohydrates. . Exercise changes: Moderate to vigorous-intensity aerobic activity 150 minutes per week if tolerated. . Lipid-lowering medications: see documented in medical record.  4. Vitamin D deficiency Low Vitamin D level contributes to fatigue and are associated with obesity, breast, and colon cancer. She agrees to continue to take OTC Vitamin D @2 ,000 IU daily and will follow-up for routine testing of Vitamin D, at least 2-3 times per year to avoid over-replacement. Check labs today.  - VITAMIN D 25 Hydroxy (Vit-D Deficiency, Fractures)  5. Vitamin B12 deficiency The diagnosis was reviewed with the patient. Counseling provided today, see below. We will continue to monitor. Orders and follow up as documented in patient record. Check labs today.  Counseling . The body needs vitamin B12: to make red blood cells; to make DNA; and to help the nerves work properly so they can carry messages from the brain to the body.  . The main causes of vitamin B12 deficiency include dietary deficiency, digestive diseases, pernicious anemia, and having a surgery in which  part of the stomach or small intestine is removed.  . Certain medicines can make it harder for the body to absorb vitamin B12. These medicines include: heartburn medications; some antibiotics; some medications used to treat diabetes, gout, and high cholesterol.  . In some cases, there are no symptoms of this condition. If the condition leads to anemia or nerve damage, various symptoms can occur, such as weakness or fatigue, shortness of breath, and numbness or tingling in your hands and feet.   . Treatment:  o May include taking vitamin B12 supplements.  o Avoid alcohol.  o Eat lots of healthy foods that contain vitamin B12: - Beef, pork, chicken, [place], and organ meats, such as liver.  - Seafood: This includes clams, rainbow trout, salmon, tuna, and haddock. Eggs.  - Cereal and dairy products that are fortified: This means that vitamin B12 has been added to the food.   - Vitamin B12  6. Screening for depression Betsie had a positive depression screening. Depression is commonly associated with obesity and often results in emotional eating behaviors. We will monitor this closely and work on CBT to help improve the non-hunger eating patterns. Referral to Psychology may be required if no improvement is seen as she continues in our clinic.  7. At risk for deficient intake of food Kenneth was given  approximately 15 minutes of deficit intake of food prevention counseling today. Arnold is at risk for eating too few calories based on current food recall. She was encouraged to focus on meeting caloric and protein goals according to her recommended meal plan.   8. Class 1 obesity with serious comorbidity and body mass index (BMI) of 31.0 to 31.9 in adult, unspecified obesity type Lakindra is currently in the action stage of change and her goal is to continue with weight loss efforts. I recommend Avigayil begin the structured treatment plan as follows:  She has agreed to the Category 2 Plan + 100 calories.    Exercise goals: No exercise has been prescribed at this time.   Behavioral modification strategies: increasing lean protein intake, no skipping meals, meal planning and cooking strategies, keeping healthy foods in the home and planning for success.  She was informed of the importance of frequent follow-up visits to maximize her success with intensive lifestyle modifications for her multiple health conditions. She was informed we would discuss her lab results at her next visit unless there is a critical issue that needs to be addressed sooner. Makenlee agreed to keep her next visit at the agreed upon time to discuss these results.  Objective:   Blood pressure 112/74, pulse 65, temperature (!) 97.5 F (36.4 C), height 5\' 6"  (1.676 m), weight 192 lb (87.1 kg), last menstrual period 09/11/2015, SpO2 99 %. Body mass index is 30.99 kg/m.  EKG: Normal sinus rhythm, rate 61.  Indirect Calorimeter completed today shows a VO2 of 238 and a REE of 1659.  Her calculated basal metabolic rate is [id number] thus her basal metabolic rate is better than expected.  General: Cooperative, alert, well developed, in no acute distress. HEENT: Conjunctivae and lids unremarkable. Cardiovascular: Regular rhythm.  Lungs: Normal work of breathing. Neurologic: No focal deficits.   Lab Results  Component Value Date   CREATININE 0.84 12/18/2020   BUN 14 12/18/2020   NA 142 12/18/2020   K 4.1 12/18/2020   CL 103 12/18/2020   CO2 22 12/18/2020   Lab Results  Component Value Date   ALT 24 12/18/2020   AST 21 12/18/2020   ALKPHOS 81 12/18/2020   BILITOT 0.4 12/18/2020   Lab Results  Component Value Date   HGBA1C 5.6 12/18/2020   HGBA1C 5.6 04/28/2018   HGBA1C 5.6 05/18/2013   Lab Results  Component Value Date   INSULIN WILL FOLLOW 12/18/2020   Lab Results  Component Value Date   TSH 0.410 (L) 12/18/2020   Lab Results  Component Value Date   CHOL 220 (H) 12/18/2020   HDL 71 12/18/2020   LDLCALC 138 (H)  12/18/2020   TRIG 65 12/18/2020   CHOLHDL 3.3 05/17/2020   Lab Results  Component Value Date   WBC 8.3 12/18/2020   HGB 13.9 12/18/2020   HCT 41.9 12/18/2020   MCV 95 12/18/2020   PLT 242 10/22/2018    Attestation Statements:   Reviewed by clinician on day of visit: allergies, medications, problem list, medical history, surgical history, family history, social history, and previous encounter notes.  Coral Ceo, am acting as transcriptionist for Coralie Common, MD.  This is the patient's first visit at [facility]. [name] NEW PATIENT PACKET was reviewed at length. Included in the packet: current and past health history, medications, allergies, ROS, gynecologic history (women only), surgical history, family history, social history, weight history, weight loss surgery history (for those that have had weight loss  surgery), nutritional evaluation, mood and food questionnaire, PHQ9, Epworth questionnaire, sleep habits questionnaire, patient life and health improvement goals questionnaire. These will all be scanned into the patient's chart under media.   During the visit, I independently reviewed the patient's EKG, bioimpedance scale results, and indirect calorimeter results. I used this information to tailor a meal plan for the patient that will help her to lose weight and will improve her obesity-related conditions going forward. I performed a medically necessary appropriate examination and/or evaluation. I discussed the assessment and treatment plan with the patient. The patient was provided an opportunity to ask questions and all were answered. The patient agreed with the plan and demonstrated an understanding of the instructions. Labs were ordered at this visit and will be reviewed at the next visit unless more critical results need to be addressed immediately. Clinical information was updated and documented in the EMR.   Time spent on visit including pre-visit  chart review and post-visit care was 45 minutes.   A separate 15 minutes was spent on risk counseling (see above).   I have reviewed the above documentation for accuracy and completeness, and I agree with the above. - Jinny Blossom, MD

## 2021-01-01 ENCOUNTER — Ambulatory Visit (INDEPENDENT_AMBULATORY_CARE_PROVIDER_SITE_OTHER): Payer: BC Managed Care – PPO | Admitting: Family Medicine

## 2021-01-01 ENCOUNTER — Other Ambulatory Visit: Payer: Self-pay

## 2021-01-01 VITALS — BP 113/74 | HR 74 | Temp 98.3°F | Ht 66.0 in | Wt 188.0 lb

## 2021-01-01 DIAGNOSIS — E559 Vitamin D deficiency, unspecified: Secondary | ICD-10-CM

## 2021-01-01 DIAGNOSIS — Z9189 Other specified personal risk factors, not elsewhere classified: Secondary | ICD-10-CM

## 2021-01-01 DIAGNOSIS — E669 Obesity, unspecified: Secondary | ICD-10-CM

## 2021-01-01 DIAGNOSIS — E782 Mixed hyperlipidemia: Secondary | ICD-10-CM

## 2021-01-01 DIAGNOSIS — E8881 Metabolic syndrome: Secondary | ICD-10-CM

## 2021-01-01 DIAGNOSIS — R946 Abnormal results of thyroid function studies: Secondary | ICD-10-CM | POA: Diagnosis not present

## 2021-01-01 MED ORDER — VITAMIN D3 50 MCG (2000 UT) PO TABS
4000.0000 [IU] | ORAL_TABLET | Freq: Every day | ORAL | 0 refills | Status: AC
Start: 2021-01-01 — End: ?

## 2021-01-02 NOTE — Progress Notes (Signed)
Chief Complaint:   OBESITY [name] is here to discuss her progress with her obesity treatment plan along with follow-up of her obesity related diagnoses. [name] is on the Category 2 Plan + 100 cal and states she is following her eating plan approximately 80% of the time. [name] states she is walking around the neighborhood, treadmill, weights, and leg lifts 40 minutes 2 times per week.  Today's visit was #: 2 Starting weight: 192 lbs Starting date: 12/18/2020 Today's weight: 188 lbs Today's date: 01/01/2021 Total lbs lost to date: 4 Total lbs lost since last in-office visit: 4  Interim History: [name] had a small indulgence of fries from [place] 1 time, but was mindful of portion. She reports occasional hunger after eating like she needed more food. She is craving carbs like potatoes and pasta. She did laughing cow cheese, jerkey stick, raw broccoli, and 1/2 peanut butter cup. She has some field trips coming up and a trip to [place].  Subjective:   1. Mixed hyperlipidemia LDL elevated at 138, HDL 71, and triglycerides 65. [name] is not on statin therapy.  2. Vitamin D deficiency [name] is on OTC Vit D 2,000 IU daily. She denies nausea, vomiting, and muscle weakness but notes fatigue. Her Vit D level is 42.9.  3. Abnormal thyroid function test TSH 0.410, T3 113, and T4 6.9. [name] has no prior history of abnormal test.  4. Insulin resistance A1c 5.6 and insulin level 10.6. [name] is not on medication.  5. At risk for diabetes mellitus [name] is at higher than average risk for developing diabetes due to obesity.   Assessment/Plan:   1. Mixed hyperlipidemia Cardiovascular risk and specific lipid/LDL goals reviewed.  We discussed several lifestyle modifications today and [name] will continue to work on diet, exercise and weight loss efforts. Orders and follow up as documented in patient record. Repeat labs in 3 months.  Counseling Intensive lifestyle modifications are the first line  treatment for this issue. . Dietary changes: Increase soluble fiber. Decrease simple carbohydrates. . Exercise changes: Moderate to vigorous-intensity aerobic activity 150 minutes per week if tolerated. . Lipid-lowering medications: see documented in medical record.  2. Vitamin D deficiency Low Vitamin D level contributes to fatigue and are associated with obesity, breast, and colon cancer. She agrees to continue to take OTC Vitamin D @2 ,000 IU, but can increase to 4,000 IU until next lab draw and will follow-up for routine testing of Vitamin D, at least 2-3 times per year to avoid over-replacement.  - Cholecalciferol (VITAMIN D3) 50 MCG (2000 UT) TABS; Take 4,000 Int'l Units by mouth daily.  Dispense: 30 tablet; Refill: 0  3. Abnormal thyroid function test Repeat TSH in 6 weeks.  4. Insulin resistance [name] will continue to work on weight loss, exercise, and decreasing simple carbohydrates to help decrease the risk of diabetes. [name] agreed to follow-up with [place] as directed to closely monitor her progress. Repeat labs in 3 months. Consider medication if hunger increases or weight stalls.  5. At risk for diabetes mellitus [name] was given approximately 30 minutes of diabetes education and counseling today. We discussed intensive lifestyle modifications today with an emphasis on weight loss as well as increasing exercise and decreasing simple carbohydrates in her diet. We also reviewed medication options with an emphasis on risk versus benefit of those discussed.   Repetitive spaced learning was employed today to elicit superior memory formation and behavioral change.  6. Obesity (BMI 30.0-34.9)  [name] is currently in the action  stage of change. As such, her goal is to continue with weight loss efforts. She has agreed to the Category 3 Plan with 8 oz at night for supper.   Exercise goals: All adults should avoid inactivity. Some physical activity is better than none, and adults who participate  in any amount of physical activity gain some health benefits.  Behavioral modification strategies: increasing lean protein intake, meal planning and cooking strategies, keeping healthy foods in the home and planning for success.  [name] has agreed to follow-up with our clinic in 2-3 weeks. She was informed of the importance of frequent follow-up visits to maximize her success with intensive lifestyle modifications for her multiple health conditions.   Objective:   Blood pressure 113/74, pulse 74, temperature 98.3 F (36.8 C), height 5\' 6"  (1.676 m), weight 188 lb (85.3 kg), last menstrual period 09/11/2015, SpO2 96 %. Body mass index is 30.34 kg/m.  General: Cooperative, alert, well developed, in no acute distress. HEENT: Conjunctivae and lids unremarkable. Cardiovascular: Regular rhythm.  Lungs: Normal work of breathing. Neurologic: No focal deficits.   Lab Results  Component Value Date   CREATININE 0.84 12/18/2020   BUN 14 12/18/2020   NA 142 12/18/2020   K 4.1 12/18/2020   CL 103 12/18/2020   CO2 22 12/18/2020   Lab Results  Component Value Date   ALT 24 12/18/2020   AST 21 12/18/2020   ALKPHOS 81 12/18/2020   BILITOT 0.4 12/18/2020   Lab Results  Component Value Date   HGBA1C 5.6 12/18/2020   HGBA1C 5.6 04/28/2018   HGBA1C 5.6 05/18/2013   Lab Results  Component Value Date   INSULIN 10.6 12/18/2020   Lab Results  Component Value Date   TSH 0.410 (L) 12/18/2020   Lab Results  Component Value Date   CHOL 220 (H) 12/18/2020   HDL 71 12/18/2020   LDLCALC 138 (H) 12/18/2020   TRIG 65 12/18/2020   CHOLHDL 3.3 05/17/2020   Lab Results  Component Value Date   WBC 8.3 12/18/2020   HGB 13.9 12/18/2020   HCT 41.9 12/18/2020   MCV 95 12/18/2020   PLT 242 10/22/2018   No results found for: IRON, TIBC, FERRITIN   Attestation Statements:   Reviewed by clinician on day of visit: allergies, medications, problem list, medical history, surgical history, family  history, social history, and previous encounter notes.  Time spent on visit including pre-visit chart review and post-visit care and charting was 30 minutes.   Coral Ceo, CMA, am acting as transcriptionist for Coralie Common, MD.   I have reviewed the above documentation for accuracy and completeness, and I agree with the above. - Jinny Blossom, MD

## 2021-01-16 ENCOUNTER — Other Ambulatory Visit: Payer: Self-pay

## 2021-01-16 ENCOUNTER — Ambulatory Visit (INDEPENDENT_AMBULATORY_CARE_PROVIDER_SITE_OTHER): Payer: BC Managed Care – PPO | Admitting: Family Medicine

## 2021-01-16 ENCOUNTER — Encounter (INDEPENDENT_AMBULATORY_CARE_PROVIDER_SITE_OTHER): Payer: Self-pay | Admitting: Family Medicine

## 2021-01-16 VITALS — BP 123/80 | HR 70 | Temp 97.5°F | Ht 66.0 in | Wt 187.0 lb

## 2021-01-16 DIAGNOSIS — E782 Mixed hyperlipidemia: Secondary | ICD-10-CM | POA: Diagnosis not present

## 2021-01-16 DIAGNOSIS — Z6831 Body mass index (BMI) 31.0-31.9, adult: Secondary | ICD-10-CM

## 2021-01-16 DIAGNOSIS — E669 Obesity, unspecified: Secondary | ICD-10-CM | POA: Diagnosis not present

## 2021-01-16 DIAGNOSIS — E559 Vitamin D deficiency, unspecified: Secondary | ICD-10-CM

## 2021-01-22 NOTE — Progress Notes (Signed)
Chief Complaint:   OBESITY [name] is here to discuss her progress with her obesity treatment plan along with follow-up of her obesity related diagnoses. [name] is on the Category 3 Plan and states she is following her eating plan approximately 30% of the time. [name] states she is walking 45 minutes 1 times per week.  Today's visit was #: 3 Starting weight: 192 lbs Starting date: 12/18/2020 Today's weight: 187 lbs Today's date: 01/16/2021 Total lbs lost to date: 5  Total lbs lost since last in-office visit: 1  Interim History: [name] had school testing over the last few weeks and so she struggled with following meal plan as strictly as she wanted. She is going out of town to [place] for a short trip then getting back to work. She finds that she felt more confident in category 2 than category 3.  Subjective:   1. Vitamin D deficiency [name] denies nausea, vomiting, and muscle weakness but notes fatigue. Pt is on OTC Vit D (hasn't picked up OTC yet).  2. Mixed hyperlipidemia LDL 138, HDL 71, and triglycerides 65. [name] is not on statin therapy.  Assessment/Plan:   1. Vitamin D deficiency Low Vitamin D level contributes to fatigue and are associated with obesity, breast, and colon cancer. She agrees to pick up and start taking OTC Vitamin D and will follow-up for routine testing of Vitamin D, at least 2-3 times per year to avoid over-replacement.  2. Mixed hyperlipidemia Cardiovascular risk and specific lipid/LDL goals reviewed.  We discussed several lifestyle modifications today and [name] will continue to work on diet, exercise and weight loss efforts. Orders and follow up as documented in patient record.  -Repeat labs in 3 months.  Counseling Intensive lifestyle modifications are the first line treatment for this issue. Dietary changes: Increase soluble fiber. Decrease simple carbohydrates. Exercise changes: Moderate to vigorous-intensity aerobic activity 150 minutes per week if  tolerated. Lipid-lowering medications: see documented in medical record.  3. Class 1 obesity with serious comorbidity and body mass index (BMI) of 31.0 to 31.9 in adult, unspecified obesity type [name] is currently in the action stage of change. As such, her goal is to continue with weight loss efforts. She has agreed to the Category 2 Plan and the Category 3 Plan.   Exercise goals: All adults should avoid inactivity. Some physical activity is better than none, and adults who participate in any amount of physical activity gain some health benefits.  Behavioral modification strategies: increasing lean protein intake, decreasing sodium intake, meal planning and cooking strategies, keeping healthy foods in the home and planning for success.  [name] has agreed to follow-up with our clinic in 3 weeks. She was informed of the importance of frequent follow-up visits to maximize her success with intensive lifestyle modifications for her multiple health conditions.   Objective:   Blood pressure 123/80, pulse 70, temperature (!) 97.5 F (36.4 C), height 5\' 6"  (1.676 m), weight 187 lb (84.8 kg), last menstrual period 09/11/2015, SpO2 97 %. Body mass index is 30.18 kg/m.  General: Cooperative, alert, well developed, in no acute distress. HEENT: Conjunctivae and lids unremarkable. Cardiovascular: Regular rhythm.  Lungs: Normal work of breathing. Neurologic: No focal deficits.   Lab Results  Component Value Date   CREATININE 0.84 12/18/2020   BUN 14 12/18/2020   NA 142 12/18/2020   K 4.1 12/18/2020   CL 103 12/18/2020   CO2 22 12/18/2020   Lab Results  Component Value Date   ALT 24  12/18/2020   AST 21 12/18/2020   ALKPHOS 81 12/18/2020   BILITOT 0.4 12/18/2020   Lab Results  Component Value Date   HGBA1C 5.6 12/18/2020   HGBA1C 5.6 04/28/2018   HGBA1C 5.6 05/18/2013   Lab Results  Component Value Date   INSULIN 10.6 12/18/2020   Lab Results  Component Value Date   TSH 0.410 (L)  12/18/2020   Lab Results  Component Value Date   CHOL 220 (H) 12/18/2020   HDL 71 12/18/2020   LDLCALC 138 (H) 12/18/2020   TRIG 65 12/18/2020   CHOLHDL 3.3 05/17/2020   Lab Results  Component Value Date   WBC 8.3 12/18/2020   HGB 13.9 12/18/2020   HCT 41.9 12/18/2020   MCV 95 12/18/2020   PLT 242 10/22/2018    Attestation Statements:   Reviewed by clinician on day of visit: allergies, medications, problem list, medical history, surgical history, family history, social history, and previous encounter notes.  Coral Ceo, CMA, am acting as transcriptionist for Coralie Common, MD.  I have reviewed the above documentation for accuracy and completeness, and I agree with the above. - Jinny Blossom, MD

## 2021-02-12 ENCOUNTER — Other Ambulatory Visit: Payer: Self-pay

## 2021-02-12 ENCOUNTER — Other Ambulatory Visit: Payer: Self-pay | Admitting: Obstetrics & Gynecology

## 2021-02-12 ENCOUNTER — Encounter (INDEPENDENT_AMBULATORY_CARE_PROVIDER_SITE_OTHER): Payer: Self-pay | Admitting: Family Medicine

## 2021-02-12 ENCOUNTER — Ambulatory Visit (INDEPENDENT_AMBULATORY_CARE_PROVIDER_SITE_OTHER): Payer: BC Managed Care – PPO | Admitting: Family Medicine

## 2021-02-12 VITALS — BP 122/81 | HR 60 | Temp 97.7°F | Ht 66.0 in | Wt 190.0 lb

## 2021-02-12 DIAGNOSIS — Z6831 Body mass index (BMI) 31.0-31.9, adult: Secondary | ICD-10-CM

## 2021-02-12 DIAGNOSIS — E669 Obesity, unspecified: Secondary | ICD-10-CM | POA: Diagnosis not present

## 2021-02-12 DIAGNOSIS — R946 Abnormal results of thyroid function studies: Secondary | ICD-10-CM | POA: Diagnosis not present

## 2021-02-12 DIAGNOSIS — Z1231 Encounter for screening mammogram for malignant neoplasm of breast: Secondary | ICD-10-CM

## 2021-02-12 DIAGNOSIS — Z9189 Other specified personal risk factors, not elsewhere classified: Secondary | ICD-10-CM

## 2021-02-12 DIAGNOSIS — E782 Mixed hyperlipidemia: Secondary | ICD-10-CM | POA: Diagnosis not present

## 2021-02-13 DIAGNOSIS — R946 Abnormal results of thyroid function studies: Secondary | ICD-10-CM | POA: Diagnosis not present

## 2021-02-13 NOTE — Progress Notes (Signed)
Chief Complaint:   OBESITY [name] is here to discuss her progress with her obesity treatment plan along with follow-up of her obesity related diagnoses. [name] is on the Category 2 Plan and the Category 3 Plan and states she is following her eating plan approximately 40% of the time. [name] states she is walking and hula hooping 60 minutes 2 times per week.  Today's visit was #: 4 Starting weight: 192 lbs Starting date: 12/18/2020 Today's weight: 190 lbs Today's date: 02/12/2021 Total lbs lost to date: 2 Total lbs lost since last in-office visit: 0  Interim History: [name] went on vacation to [place] and then [place] over the last few weeks. She struggled with eating out. The next few weeks she is traveling to [place] for a conference. She is looking for options for eating out.  Subjective:   1. Mixed hyperlipidemia [name] has an LDL of 138, HDL 71, and triglycerides 65. She is not on statin therapy.  2. Abnormal thyroid function test [name]'s last TSH was 0.410.  3. At risk for heart disease [name] is at a higher than average risk for cardiovascular disease due to obesity.   Assessment/Plan:   1. Mixed hyperlipidemia Cardiovascular risk and specific lipid/LDL goals reviewed.  We discussed several lifestyle modifications today and [name] will continue to work on diet, exercise and weight loss efforts. Orders and follow up as documented in patient record. Repeat labs in September.  Counseling Intensive lifestyle modifications are the first line treatment for this issue. Dietary changes: Increase soluble fiber. Decrease simple carbohydrates. Exercise changes: Moderate to vigorous-intensity aerobic activity 150 minutes per week if tolerated. Lipid-lowering medications: see documented in medical record.  2. Abnormal thyroid function test Check labs today.  - TSH  3. At risk for heart disease [name] was given approximately 15 minutes of coronary artery disease prevention  counseling today. She is 51 y.o. female and has risk factors for heart disease including obesity. We discussed intensive lifestyle modifications today with an emphasis on specific weight loss instructions and strategies.   Repetitive spaced learning was employed today to elicit superior memory formation and behavioral change.  4. Class 1 obesity with serious comorbidity and body mass index (BMI) of 31.0 to 31.9 in adult, unspecified obesity type  [name] is currently in the action stage of change. As such, her goal is to continue with weight loss efforts. She has agreed to the Category 2 Plan and keeping a food journal and adhering to recommended goals of 1200-1300 calories and 50+ protein.   Exercise goals: All adults should avoid inactivity. Some physical activity is better than none, and adults who participate in any amount of physical activity gain some health benefits.  Behavioral modification strategies: increasing lean protein intake, meal planning and cooking strategies, keeping healthy foods in the home, and planning for success.  [name] has agreed to follow-up with our clinic in 3 weeks. She was informed of the importance of frequent follow-up visits to maximize her success with intensive lifestyle modifications for her multiple health conditions.   [name] was informed we would discuss her lab results at her next visit unless there is a critical issue that needs to be addressed sooner. [name] agreed to keep her next visit at the agreed upon time to discuss these results.  Objective:   Blood pressure 122/81, pulse 60, temperature 97.7 F (36.5 C), height 5\' 6"  (1.676 m), weight 190 lb (86.2 kg), last menstrual period 09/11/2015, SpO2 99 %. Body mass index  is 30.67 kg/m.  General: Cooperative, alert, well developed, in no acute distress. HEENT: Conjunctivae and lids unremarkable. Cardiovascular: Regular rhythm.  Lungs: Normal work of breathing. Neurologic: No focal deficits.   Lab  Results  Component Value Date   CREATININE 0.84 12/18/2020   BUN 14 12/18/2020   NA 142 12/18/2020   K 4.1 12/18/2020   CL 103 12/18/2020   CO2 22 12/18/2020   Lab Results  Component Value Date   ALT 24 12/18/2020   AST 21 12/18/2020   ALKPHOS 81 12/18/2020   BILITOT 0.4 12/18/2020   Lab Results  Component Value Date   HGBA1C 5.6 12/18/2020   HGBA1C 5.6 04/28/2018   HGBA1C 5.6 05/18/2013   Lab Results  Component Value Date   INSULIN 10.6 12/18/2020   Lab Results  Component Value Date   TSH 0.410 (L) 12/18/2020   Lab Results  Component Value Date   CHOL 220 (H) 12/18/2020   HDL 71 12/18/2020   LDLCALC 138 (H) 12/18/2020   TRIG 65 12/18/2020   CHOLHDL 3.3 05/17/2020   Lab Results  Component Value Date   WBC 8.3 12/18/2020   HGB 13.9 12/18/2020   HCT 41.9 12/18/2020   MCV 95 12/18/2020   PLT 242 10/22/2018   No results found for: IRON, TIBC, FERRITIN   Attestation Statements:   Reviewed by clinician on day of visit: allergies, medications, problem list, medical history, surgical history, family history, social history, and previous encounter notes.  Coral Ceo, CMA, am acting as transcriptionist for Coralie Common, MD.   I have reviewed the above documentation for accuracy and completeness, and I agree with the above. - Jinny Blossom, MD

## 2021-02-14 LAB — TSH: TSH: 0.586 u[IU]/mL (ref 0.450–4.500)

## 2021-02-22 ENCOUNTER — Ambulatory Visit: Payer: BC Managed Care – PPO | Admitting: Obstetrics & Gynecology

## 2021-03-05 ENCOUNTER — Ambulatory Visit (INDEPENDENT_AMBULATORY_CARE_PROVIDER_SITE_OTHER): Payer: BC Managed Care – PPO | Admitting: Family Medicine

## 2021-03-11 ENCOUNTER — Ambulatory Visit (INDEPENDENT_AMBULATORY_CARE_PROVIDER_SITE_OTHER): Payer: BC Managed Care – PPO | Admitting: Family Medicine

## 2021-03-12 ENCOUNTER — Other Ambulatory Visit: Payer: Self-pay

## 2021-03-12 ENCOUNTER — Encounter (INDEPENDENT_AMBULATORY_CARE_PROVIDER_SITE_OTHER): Payer: Self-pay | Admitting: Physician Assistant

## 2021-03-12 ENCOUNTER — Ambulatory Visit (INDEPENDENT_AMBULATORY_CARE_PROVIDER_SITE_OTHER): Payer: BC Managed Care – PPO | Admitting: Physician Assistant

## 2021-03-12 VITALS — BP 117/77 | HR 64 | Temp 97.4°F | Ht 66.0 in | Wt 188.0 lb

## 2021-03-12 DIAGNOSIS — Z6831 Body mass index (BMI) 31.0-31.9, adult: Secondary | ICD-10-CM

## 2021-03-12 DIAGNOSIS — F3289 Other specified depressive episodes: Secondary | ICD-10-CM

## 2021-03-12 DIAGNOSIS — E669 Obesity, unspecified: Secondary | ICD-10-CM | POA: Diagnosis not present

## 2021-03-12 DIAGNOSIS — E559 Vitamin D deficiency, unspecified: Secondary | ICD-10-CM

## 2021-03-12 NOTE — Progress Notes (Signed)
Office: [phone]  /  Fax: [phone]    Date: March 26, 2021   Appointment Start Time: 9:09am Duration: 35 minutes Provider: Glennie Isle, Psy.D. Type of Session: Intake for Individual Therapy  Location of Patient: Work Location of Provider: Provider's home (private office) Type of Contact: Telepsychological Visit via MyChart Video Visit  Informed Consent: This provider called [name] at 9:04am as she did not present for today's appointment. Assistance on connecting was provided. As such, today's appointment was initiated 9 minutes late. Prior to proceeding with today's appointment, two pieces of identifying information were obtained. In addition, Jurnee's physical location at the time of this appointment was obtained as well a phone number she could be reached at in the event of technical difficulties. Mariacristina and this provider participated in today's telepsychological service. Of note, today's appointment was switched to a regular telephone call at 9:22am with Denys's verbal consent due to technical issues.   The provider's role was explained to [employer]. The provider reviewed and discussed issues of confidentiality, privacy, and limits therein (e.g., reporting obligations). In addition to verbal informed consent, written informed consent for psychological services was obtained prior to the initial appointment. Since the clinic is not a 24/7 crisis center, mental health emergency resources were shared and this  provider explained MyChart, e-mail, voicemail, and/or other messaging systems should be utilized only for non-emergency reasons. This provider also explained that information obtained during appointments will be placed in Fonnie's medical record and relevant information will be shared with other providers at [facility] for coordination of care. Kennidy agreed information may be shared with other [facility] providers as needed for coordination  of care and by signing the service agreement document, she provided written consent for coordination of care. Prior to initiating telepsychological services, Monicia completed an informed consent document, which included the development of a safety plan (i.e., an emergency contact and emergency resources) in the event of an emergency/crisis. Rosabella verbally acknowledged understanding she is ultimately responsible for understanding her insurance benefits for telepsychological and in-person services. This provider also reviewed confidentiality, as it relates to telepsychological services, as well as the rationale for telepsychological services (i.e., to reduce exposure risk to COVID-19). Neri  acknowledged understanding that appointments cannot be recorded without both party consent and she is aware she is responsible for securing confidentiality on her end of the session. Marcelyn verbally consented to proceed.  Chief Complaint/HPI: Avril was referred by Abby Potash, PA-C due to  other depression . Per the note for the visit with Abby Potash, PA-C on March 12, 2021, "Almendra notes she is a stress eater and would like strategies to help with that.."   During today's appointment, Dhana was verbally administered a questionnaire assessing various behaviors related to emotional eating behaviors. Alexiss endorsed the following: overeat when you are celebrating, experience food cravings on a regular basis, use food to help you cope with emotional situations, find food is comforting to you, and not worry about what you eat when you are in a good mood. Currently, Orlinda indicated stress related to her employment triggers emotional eating behaviors. She explained the stress of learning her new role during a pandemic resulted in her engaging in emotional eating behaviors, noting the current frequency as "every day." She expressed desire to "break the routine" as she is "aware" of what is happening. Sanskriti indicated she has  started to work out again. In addition, Brenda denied a history of binge eating behaviors.  Dortha denied a history of restricting food intake, purging and engagement in other compensatory strategies for weight loss, and has never been diagnosed with an eating disorder. She also denied a history of treatment for emotional eating. However, she reported she started taking injections to reduce cravings. She was encouraged to inform Abby Potash, PA-C; she agreed. Furthermore, Tora denied other problems of concern.    Mental Status Examination:  Appearance: well groomed and appropriate hygiene  Behavior: appropriate to circumstances Mood: euthymic Affect: mood congruent Speech: normal in rate, volume, and tone Eye Contact: appropriate Psychomotor Activity: unable to assess Gait: unable to assess  Thought Process: linear, logical, and goal directed  Thought Content/Perception: denies suicidal and homicidal ideation, plan, and intent, no hallucinations, delusions, bizarre thinking or behavior reported or observed, and denies ideation and engagement in self-injurious behaviors Orientation: time, person, place, and purpose of appointment Memory/Concentration: memory, attention, language, and fund of knowledge intact  Insight/Judgment: good  Family & Psychosocial History: Genisis reported she is married and she has a daughter (age 36) and son (age 33). 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 Additionally, Ladora shared her highest level of education obtained is a master's degree. Currently, Kynnedi's social support system consists of her husband, daughter, and mother. Moreover, Nedda stated she resides with her husband and daughter.   Medical History:  Past Medical History:  Diagnosis Date   Acne    Anemia    Anxiety    Chest pain    Constipation    Depression    Elevated troponin 04/28/2018   Endometriosis    Food allergy    HELLP (hemolytic anemia/elev liver enzymes/low platelets in  pregnancy)    hx of two pregnancies   Hypertension    Joint pain    Kidney problem    Lactose intolerance    Leg cramps    Low back pain    Miscarriage    Normal spontaneous vaginal delivery    Obesity    Obesity (BMI 30.0-34.9) 11/07/2020   Other fatigue    Palpitation    Sleep apnea    SOB (shortness of breath) on exertion    Thyroid dysfunction    Vitamin B 12 deficiency    Vitamin D deficiency    Past Surgical History:  Procedure Laterality Date   ABDOMINOPLASTY     BILATERAL SALPINGECTOMY Right 10/16/2015   Procedure: RIGHT  SALPINGECTOMY;  Surgeon: Terrance Mass, MD;  Location: [facility];  Service: Gynecology;  Laterality: Right;   BUNIONECTOMY Right 11/30/2015   @ [facility]   BUNIONECTOMY Left 12/21/2015   @ [facility]   BUNIONECTOMY Left 03/07/2016   @[facility]     CERVICAL ABLATION     CESAREAN SECTION     CYSTOSCOPY N/A 10/16/2015   Procedure: CYSTOSCOPY;  Surgeon: Terrance Mass, MD;  Location: [facility];  Service: Gynecology;  Laterality: N/A;   DILATION AND CURETTAGE OF UTERUS     LAPAROSCOPIC VAGINAL HYSTERECTOMY WITH SALPINGO OOPHORECTOMY Left 10/16/2015   Procedure: LAPAROSCOPIC ASSISTED VAGINAL HYSTERECTOMY WITH SALPINGO OOPHORECTOMY;  Surgeon: Terrance Mass, MD;  Location: [facility];  Service: Gynecology;  Laterality: Left;   TUBAL LIGATION     Current Outpatient Medications on File Prior to Visit  Medication Sig Dispense Refill   Cholecalciferol (VITAMIN D3) 50 MCG (2000 UT) TABS Take 4,000 Int'l Units by mouth daily. 30 tablet 0   Cyanocobalamin (VITAMIN B-12 PO) Take 5,000 mcg by mouth daily.     Multiple Vitamins-Minerals (CENTRUM SILVER  50+WOMEN) TABS Take 1 tablet by mouth daily.     NUTRITIONAL SUPPLEMENTS PO Take by mouth. BIO-X4     TURMERIC PO Take 2,000 mg by mouth.     No current facility-administered medications on file prior to visit.   Mental Health History: Cherilynn reported she has never attended therapeutic services. She denied a history of psychotropic  medications. Alvie reported there is no history of hospitalizations for psychiatric concerns. Regarding family history, she stated there is "mental illness" on her father's side (maternal aunts and cousins) resulting in hospitalizations; however, she was unaware of diagnoses. Ellyce reported there is no history of trauma including psychological, physical , and sexual abuse, as well as neglect.   Collin described her typical mood lately as "kind of medium sometimes down, only because of the weight." She discussed a history of panic attacks, noting the last time was one year ago. Additionally, [name] discussed experiencing worry about work (e.g., teachers quitting). She added, "Home is my safe haven." Shimeka endorsed current alcohol use (i.e., a standard pour of champagne or mixed drink approximately 2xs a week). She denied tobacco use. She denied illicit/recreational substance use. Regarding caffeine intake, Tiannah reported consuming coffee "every blue moon." Furthermore, Aida indicated she is not experiencing the following: hallucinations and delusions, paranoia, symptoms of mania , social withdrawal, crying spells, panic attacks, and obsessions and compulsions. She also denied history of and current suicidal ideation, plan, and intent; history of and current homicidal ideation, plan, and intent; and history of and current engagement in self-harm.  The following strengths were reported by [facility]: multitasking, recognizing weaknesses, people person, and task oriented. The following strengths were observed by this provider: ability to express thoughts and feelings during the therapeutic session, ability to establish and benefit from a therapeutic relationship, willingness to work toward established goal(s) with the clinic and ability to engage in reciprocal conversation.   Legal History: Yazaira reported there is no history of legal involvement.   Structured Assessments Results: The Patient Health Questionnaire-9  (PHQ-9) is a self-report measure that assesses symptoms and severity of depression over the course of the last two weeks. Shadow obtained a score of 6 suggesting mild depression. Lynell finds the endorsed symptoms to be not difficult at all. [0= Not at all; 1= Several days; 2= More than half the days; 3= Nearly every day] Little interest or pleasure in doing things 0  Feeling down, depressed, or hopeless 1  Trouble falling or staying asleep, or sleeping too much- due to hot flashes 3  Feeling tired or having little energy 1  Poor appetite or overeating 0  Feeling bad about yourself --- or that you are a failure or have let yourself or your family down- due to weight 1  Trouble concentrating on things, such as reading the newspaper or watching television 0   Moving or speaking so slowly that other people could have noticed? Or the opposite --- being so fidgety or restless that you have been moving around a lot more than usual 0  Thoughts that you would be better off dead or hurting yourself in some way 0  PHQ-9 Score 6    The Generalized Anxiety Disorder-7 (GAD-7) is a brief self-report measure that assesses symptoms of anxiety over the course of the last two weeks. Valina obtained a score of 3 suggesting minimal anxiety. Kinzleigh finds the endorsed symptoms to be not difficult at all. [0= Not at all; 1= Several days; 2= Over half the days; 3= Nearly every  day] Feeling nervous, anxious, on edge- due to upcoming events at work 1  Not being able to stop or control worrying 0  Worrying too much about different things 1  Trouble relaxing 0  Being so restless that it's hard to sit still 1  Becoming easily annoyed or irritable 0  Feeling afraid as if something awful might happen 0  GAD-7 Score 3   Interventions:  Conducted a chart review Focused on rapport building Verbally administered PHQ-9 and GAD-7 for symptom monitoring Verbally administered Food & Mood questionnaire to assess various behaviors  related to emotional eating Provided emphatic reflections and validation Collaborated with patient on a treatment goal  Psychoeducation provided regarding physical versus emotional hunger  Provisional DSM-5 Diagnosis(es): F32.89 Other Specified Depressive Disorder, Emotional Eating Behaviors and F41.9 Unspecified Anxiety Disorder  Plan: Laverne appears able and willing to participate as evidenced by collaboration on a treatment goal, engagement in reciprocal conversation, and asking questions as needed for clarification. The next appointment will be scheduled in two weeks, which will be via MyChart Video Visit. The following treatment goal was established: increase coping skills. This provider will regularly review the treatment plan and medical chart to keep informed of status changes. Emmalie expressed understanding and agreement with the initial treatment plan of care. Dedee will be sent a handout via e-mail to utilize between now and the next appointment to increase awareness of hunger patterns and subsequent eating. [name] provided verbal consent during today's appointment for this provider to send the handout via e-mail.

## 2021-03-15 ENCOUNTER — Ambulatory Visit: Payer: BC Managed Care – PPO | Admitting: Obstetrics & Gynecology

## 2021-03-15 DIAGNOSIS — Z0289 Encounter for other administrative examinations: Secondary | ICD-10-CM

## 2021-03-19 NOTE — Progress Notes (Signed)
Chief Complaint:   OBESITY [name] is here to discuss her progress with her obesity treatment plan along with follow-up of her obesity related diagnoses. [name] is on the Category 2 Plan or keeping a food journal and adhering to recommended goals of 1200-1300 calories and 50+ grams of protein daily and states she is following her eating plan approximately 50% of the time. [name] states she is lifting weights for 30 minutes 3 times per week, and walking for 30-40 minutes 2 times per week.  Today's visit was #: 5 Starting weight: 192 lbs Starting date: 12/18/2020 Today's weight: 188 lbs Today's date: 03/12/2021 Total lbs lost to date: 4 Total lbs lost since last in-office visit: 2  Interim History: [name] reports that she has been a stress eater ever since COVID started. She did not journal as she did not know what her protein goal was. She wants to follow a plan and she is ready to start.  Subjective:   1. Vitamin D deficiency [name] is on OTC Vit D, and her last Vit D level was not at goal.  2. Other depression [name] notes she is a stress eater and would like strategies to help with that.  Assessment/Plan:   1. Vitamin D deficiency Low Vitamin D level contributes to fatigue and are associated with obesity, breast, and colon cancer. [name] will continue OTC Vitamin D and will follow-up for routine testing of Vitamin D, at least 2-3 times per year to avoid over-replacement.  2. Other depression Behavior modification techniques were discussed today to help [name] deal with her emotional/non-hunger eating behaviors. We will refer [name] to Dr. Mallie Mussel, our Bariatric Psychologist for evaluation. Orders and follow up as documented in patient record.   3. Class 1 obesity with serious comorbidity and body mass index (BMI) of 31.0 to 31.9 in adult, unspecified obesity type, current bmi 30.36 [name] is currently in the action stage of change. As such, her goal is to continue with weight loss  efforts. She has agreed to the Category 2 Plan or keeping a food journal and adhering to recommended goals of 1200-1300 calories and 85 grams of protein daily.   Exercise goals: As is.  Behavioral modification strategies: meal planning and cooking strategies and keeping healthy foods in the home.  [name] has agreed to follow-up with our clinic in 2 weeks. She was informed of the importance of frequent follow-up visits to maximize her success with intensive lifestyle modifications for her multiple health conditions.   Objective:   Blood pressure 117/77, pulse 64, temperature (!) 97.4 F (36.3 C), height '5\' 6"'$  (1.676 m), weight 188 lb (85.3 kg), last menstrual period 09/11/2015, SpO2 99 %. Body mass index is 30.34 kg/m.  General: Cooperative, alert, well developed, in no acute distress. HEENT: Conjunctivae and lids unremarkable. Cardiovascular: Regular rhythm.  Lungs: Normal work of breathing. Neurologic: No focal deficits.   Lab Results  Component Value Date   CREATININE 0.84 12/18/2020   BUN 14 12/18/2020   NA 142 12/18/2020   K 4.1 12/18/2020   CL 103 12/18/2020   CO2 22 12/18/2020   Lab Results  Component Value Date   ALT 24 12/18/2020   AST 21 12/18/2020   ALKPHOS 81 12/18/2020   BILITOT 0.4 12/18/2020   Lab Results  Component Value Date   HGBA1C 5.6 12/18/2020   HGBA1C 5.6 04/28/2018   HGBA1C 5.6 05/18/2013   Lab Results  Component Value Date   INSULIN 10.6 12/18/2020   Lab Results  Component Value Date   TSH 0.586 02/13/2021   Lab Results  Component Value Date   CHOL 220 (H) 12/18/2020   HDL 71 12/18/2020   LDLCALC 138 (H) 12/18/2020   TRIG 65 12/18/2020   CHOLHDL 3.3 05/17/2020   Lab Results  Component Value Date   VD25OH 42.9 12/18/2020   VD25OH 26 (L) 11/26/2017   VD25OH 39 04/29/2016   Lab Results  Component Value Date   WBC 8.3 12/18/2020   HGB 13.9 12/18/2020   HCT 41.9 12/18/2020   MCV 95 12/18/2020   PLT 242 10/22/2018   No results  found for: IRON, TIBC, FERRITIN  Attestation Statements:   Reviewed by clinician on day of visit: allergies, medications, problem list, medical history, surgical history, family history, social history, and previous encounter notes.  Time spent on visit including pre-visit chart review and post-visit care and charting was 45 minutes.    Wilhemena Durie, am acting as transcriptionist for [employer], PA-C.  I have reviewed the above documentation for accuracy and completeness, and I agree with the above. Abby Potash, PA-C

## 2021-03-26 ENCOUNTER — Telehealth (INDEPENDENT_AMBULATORY_CARE_PROVIDER_SITE_OTHER): Payer: BC Managed Care – PPO | Admitting: Psychology

## 2021-03-26 DIAGNOSIS — F5089 Other specified eating disorder: Secondary | ICD-10-CM

## 2021-03-26 DIAGNOSIS — F419 Anxiety disorder, unspecified: Secondary | ICD-10-CM

## 2021-03-26 NOTE — Progress Notes (Incomplete)
Office: [phone]  /  Fax: [phone]    Date: April 09, 2021   Appointment Start Time: *** Duration: *** minutes Provider: Glennie Isle, Psy.D. Type of Session: Individual Therapy  Location of Patient: {gbptloc:23249} Location of Provider: Provider's home (private office) Type of Contact: Telepsychological Visit via MyChart Video Visit  Session Content:This provider called [name] at 12:32pm as she did not present for today's appointment. A HIPAA compliant voicemail requesting a call back could not be left as her mailbox was full. *** As such, today's appointment was initiated *** minutes late. [name] is a 50 y.o. female presenting for a follow-up appointment to address the previously established treatment goal of increasing coping skills. Today's appointment was a telepsychological visit due to COVID-19. [name] provided verbal consent for today's telepsychological appointment and she is aware she is responsible for securing confidentiality on her end of the session. Prior to proceeding with today's appointment, [name]'s physical location at the time of this appointment was obtained as well a phone number she could be reached at in the event of technical difficulties. [name] and this provider participated in today's telepsychological service.   This provider conducted a brief check-in and verbally administered the PHQ-9 and GAD-7. *** [name] was receptive to today's appointment as evidenced by openness to sharing, responsiveness to feedback, and {gbreceptiveness:23401}.  Mental Status Examination:  Appearance: {Appearance:22431} Behavior: {Behavior:22445} Mood: {gbmood:21757} Affect: {Affect:22436} Speech: {Speech:22432} Eye Contact: {Eye Contact:22433} Psychomotor Activity: unable to assess Gait: unable to assess Thought Process: {thought process:22448}  Thought Content/Perception: {disturbances:22451} Orientation: {Orientation:22437} Memory/Concentration:  {gbcognition:22449} Insight/Judgment: {Insight:22446}  Structured Assessments Results: The Patient Health Questionnaire-9 (PHQ-9) is a self-report measure that assesses symptoms and severity of depression over the course of the last two weeks. [name] obtained a score of *** suggesting {GBPHQ9SEVERITY:21752}. [name] finds the endorsed symptoms to be {gbphq9difficulty:21754}. [0= Not at all; 1= Several days; 2= More than half the days; 3= Nearly every day] Little interest or pleasure in doing things ***  Feeling down, depressed, or hopeless ***  Trouble falling or staying asleep, or sleeping too much ***  Feeling tired or having little energy ***  Poor appetite or overeating ***  Feeling bad about yourself --- or that you are a failure or have let yourself or your family down ***  Trouble concentrating on things, such as reading the newspaper or watching television ***  Moving or speaking so slowly that other people could have noticed? Or the opposite --- being so fidgety or restless that you have been moving around a lot more than usual ***  Thoughts that you would be better off dead or hurting yourself in some way ***  PHQ-9 Score ***    The Generalized Anxiety Disorder-7 (GAD-7) is a brief self-report measure that assesses symptoms of anxiety over the course of the last two weeks. [name] obtained a score of *** suggesting {gbgad7severity:21753}. [name] finds the endorsed symptoms to be {gbphq9difficulty:21754}. [0= Not at all; 1= Several days; 2= Over half the days; 3= Nearly every day] Feeling nervous, anxious, on edge ***  Not being able to stop or control worrying ***  Worrying too much about different things ***  Trouble relaxing ***  Being so restless that it's hard to sit still ***  Becoming easily annoyed or irritable ***  Feeling afraid as if something awful might happen ***  GAD-7 Score ***   Interventions:  {Interventions:22172}  DSM-5 Diagnosis(es): F32.89 Other Specified  Depressive Disorder, Emotional Eating Behaviors and F41.9 Unspecified Anxiety Disorder  Treatment  Goal & Progress: During the initial appointment with this provider, the following treatment goal was established: increase coping skills. [name] has demonstrated progress in her goal as evidenced by {gbtxprogress:22839}. [name] also {gbtxprogress2:22951}.  Plan: The next appointment will be scheduled in {gbweeks:21758}, which will be {gbtxmodality:23402}. The next session will focus on {Plan for Next Appointment:23400}.

## 2021-03-27 ENCOUNTER — Ambulatory Visit (INDEPENDENT_AMBULATORY_CARE_PROVIDER_SITE_OTHER): Payer: BC Managed Care – PPO | Admitting: Physician Assistant

## 2021-03-31 ENCOUNTER — Emergency Department (HOSPITAL_BASED_OUTPATIENT_CLINIC_OR_DEPARTMENT_OTHER): Payer: BC Managed Care – PPO

## 2021-03-31 ENCOUNTER — Encounter (HOSPITAL_BASED_OUTPATIENT_CLINIC_OR_DEPARTMENT_OTHER): Payer: Self-pay

## 2021-03-31 ENCOUNTER — Emergency Department (HOSPITAL_BASED_OUTPATIENT_CLINIC_OR_DEPARTMENT_OTHER)
Admission: EM | Admit: 2021-03-31 | Discharge: 2021-03-31 | Disposition: A | Discharge status: Home / Self Care | Payer: BC Managed Care – PPO | Attending: Emergency Medicine | Admitting: Emergency Medicine

## 2021-03-31 DIAGNOSIS — R079 Chest pain, unspecified: Secondary | ICD-10-CM | POA: Diagnosis not present

## 2021-03-31 DIAGNOSIS — I309 Acute pericarditis, unspecified: Secondary | ICD-10-CM | POA: Diagnosis not present

## 2021-03-31 DIAGNOSIS — R0789 Other chest pain: Secondary | ICD-10-CM | POA: Diagnosis not present

## 2021-03-31 DIAGNOSIS — I10 Essential (primary) hypertension: Secondary | ICD-10-CM | POA: Insufficient documentation

## 2021-03-31 LAB — TROPONIN I (HIGH SENSITIVITY)
Troponin I (High Sensitivity): 3 ng/L (ref <18)
Troponin I (High Sensitivity): 3 ng/L (ref <18)

## 2021-03-31 LAB — CBC
HCT: 38.5 % (ref 36.0–46.0)
Hemoglobin: 13 g/dL (ref 12.0–15.0)
MCH: 31.2 pg (ref 26.0–34.0)
MCHC: 33.8 g/dL (ref 30.0–36.0)
MCV: 92.3 fL (ref 80.0–100.0)
Platelets: 257 10*3/uL (ref 150–400)
RBC: 4.17 MIL/uL (ref 3.87–5.11)
RDW: 13.7 % (ref 11.5–15.5)
WBC: 12.3 10*3/uL — ABNORMAL HIGH (ref 4.0–10.5)
nRBC: 0 % (ref 0.0–0.2)

## 2021-03-31 LAB — BASIC METABOLIC PANEL
Anion gap: 9 (ref 5–15)
BUN: 24 mg/dL — ABNORMAL HIGH (ref 6–20)
CO2: 27 mmol/L (ref 22–32)
Calcium: 9.1 mg/dL (ref 8.9–10.3)
Chloride: 103 mmol/L (ref 98–111)
Creatinine, Ser: 0.81 mg/dL (ref 0.44–1.00)
GFR, Estimated: >60 mL/min (ref >60)
Glucose, Bld: 94 mg/dL (ref 70–99)
Potassium: 3.8 mmol/L (ref 3.5–5.1)
Sodium: 139 mmol/L (ref 135–145)

## 2021-03-31 MED ORDER — COLCHICINE 0.6 MG PO TABS: 0.6000 mg | ORAL_TABLET | Freq: Two times a day (BID) | ORAL | 1 refills | Status: DC

## 2021-03-31 MED ORDER — COLCHICINE 0.6 MG PO TABS
0.6000 mg | ORAL_TABLET | Freq: Once | ORAL | Status: AC
  Administered 2021-03-31: 0.6 mg via ORAL
  Filled 2021-03-31: qty 1

## 2021-03-31 MED ORDER — KETOROLAC TROMETHAMINE 15 MG/ML IJ SOLN
15.0000 mg | Freq: Once | INTRAMUSCULAR | Status: AC
  Administered 2021-03-31: 15 mg via INTRAVENOUS
  Filled 2021-03-31: qty 1

## 2021-03-31 MED ORDER — IBUPROFEN 800 MG PO TABS: 800.0000 mg | ORAL_TABLET | Freq: Three times a day (TID) | ORAL | 0 refills | Status: DC

## 2021-03-31 NOTE — ED Provider Notes (Signed)
[facility] EMERGENCY DEPT Provider Note   CSN: [id number] Arrival date & time: 03/31/21  1944     History Chief Complaint  Patient presents with  . Chest Pain    [name] is a 50 y.o. female.  HPI 50 year old female presents with chest tightness and pain.  Started about 4 days ago but got worse today.  It hurts to take a deep breath and it hurts when she is sitting up and leaning forward as well as laying flat.  This feels similar to when she had pericarditis a few years ago.  No fevers or recent illness.  She has taken some ibuprofen with partial relief.  No leg swelling.  Pain is currently about a 5 out of 10 but was 7 before arrival.  Past Medical History:  Diagnosis Date  . Acne   . Anemia   . Anxiety   . Chest pain   . Constipation   . Depression   . Elevated troponin 04/28/2018  . Endometriosis   . Food allergy   . HELLP (hemolytic anemia/elev liver enzymes/low platelets in pregnancy)    hx of two pregnancies  . Hypertension   . Joint pain   . Kidney problem   . Lactose intolerance   . Leg cramps   . Low back pain   . Miscarriage   . Normal spontaneous vaginal delivery   . Obesity   . Obesity (BMI 30.0-34.9) 11/07/2020  . Other fatigue   . Palpitation   . Sleep apnea   . SOB (shortness of breath) on exertion   . Thyroid dysfunction   . Vitamin B 12 deficiency   . Vitamin D deficiency     Patient Active Problem List   Diagnosis Date Noted  . Obesity (BMI 30.0-34.9) 11/07/2020  . NSTEMI (non-ST elevated myocardial infarction) ([place]) 04/28/2018  . Elevated troponin 04/28/2018  . Chest pain 04/27/2018  . SIRS (systemic inflammatory response syndrome) ([place]) 04/27/2018  . Peripheral neuropathy 04/27/2018  . Acute pericarditis   . 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 Bunion 11/26/2015  . History of endometriosis 10/12/2015  . H/O thyroid disease 04/02/2012  . Family history of diabetes mellitus 04/02/2012     Past Surgical History:  Procedure Laterality Date  . ABDOMINOPLASTY    . BILATERAL SALPINGECTOMY Right 10/16/2015   Procedure: RIGHT  SALPINGECTOMY;  Surgeon: Terrance Mass, MD;  Location: [facility];  Service: Gynecology;  Laterality: Right;  . BUNIONECTOMY Right 11/30/2015   @ [place]  . BUNIONECTOMY Left 12/21/2015   @ [place]  . BUNIONECTOMY Left 03/07/2016   '@[facility]'$    . CERVICAL ABLATION    . CESAREAN SECTION    . CYSTOSCOPY N/A 10/16/2015   Procedure: CYSTOSCOPY;  Surgeon: Terrance Mass, MD;  Location: [facility];  Service: Gynecology;  Laterality: N/A;  . DILATION AND CURETTAGE OF UTERUS    . LAPAROSCOPIC VAGINAL HYSTERECTOMY WITH SALPINGO OOPHORECTOMY Left 10/16/2015   Procedure: LAPAROSCOPIC ASSISTED VAGINAL HYSTERECTOMY WITH SALPINGO OOPHORECTOMY;  Surgeon: Terrance Mass, MD;  Location: [facility];  Service: Gynecology;  Laterality: Left;  . TUBAL LIGATION       OB History     Gravida  2   Para  2   Term  1   Preterm  1   AB      Living  2      SAB      IAB      Ectopic      Multiple  Live Births  2           Family History  Problem Relation Age of Onset  . Diabetes Mother   . Hypertension Mother   . Thyroid disease Mother   . Cancer Mother        THYROID  . Hyperlipidemia Mother   . Obesity Mother   . Lymphoma Father   . Cataracts Father   . Heart failure Father   . Cancer Father   . Anxiety disorder Father   . Diabetes Brother   . Cancer Brother        lymphoma  . Heart disease Maternal Grandfather   . Heart failure Maternal Grandfather   . Stroke Maternal Grandfather   . Sickle cell anemia Maternal Aunt   . Kidney failure Maternal Grandmother   . Diabetes Maternal Grandmother     Social History   Tobacco Use  . Smoking status: Never  . Smokeless tobacco: Never  Vaping Use  . Vaping Use: Never used  Substance Use Topics  . Alcohol use: Yes    Alcohol/week: 0.0 standard drinks    Comment: WINE.... OCC  . Drug use: No    Home  Medications Prior to Admission medications   Medication Sig Start Date End Date Taking? Authorizing Provider  colchicine 0.6 MG tablet Take 1 tablet (0.6 mg total) by mouth 2 (two) times daily. 03/31/21  Yes Sherwood Gambler, MD  ibuprofen (ADVIL) 800 MG tablet Take 1 tablet (800 mg total) by mouth 3 (three) times daily. 03/31/21  Yes Sherwood Gambler, MD  Cholecalciferol (VITAMIN D3) 50 MCG (2000 UT) TABS Take 4,000 Int'l Units by mouth daily. 01/01/21   Laqueta Linden, MD  Cyanocobalamin (VITAMIN B-12 PO) Take 5,000 mcg by mouth daily.    [provider]  Multiple Vitamins-Minerals (CENTRUM SILVER 50+WOMEN) TABS Take 1 tablet by mouth daily.    [provider]  NUTRITIONAL SUPPLEMENTS PO Take by mouth. BIO-X4    [provider]  TURMERIC PO Take 2,000 mg by mouth.    [provider]    Allergies    Iodinated diagnostic agents, Iodine, Other, and Shellfish allergy  Review of Systems   Review of Systems  Constitutional:  Negative for fever.  Respiratory:  Negative for shortness of breath.   Cardiovascular:  Positive for chest pain. Negative for leg swelling.  Gastrointestinal:  Negative for vomiting.  All other systems reviewed and are negative.  Physical Exam Updated Vital Signs BP 133/90   Pulse 60   Temp 97.9 F (36.6 C)   Resp 20   Ht '5\' 7"'$  (1.702 m)   Wt 84.8 kg   LMP 09/11/2015   SpO2 96%   BMI 29.29 kg/m   Physical Exam Vitals and nursing note reviewed.  Constitutional:      General: She is not in acute distress.    Appearance: She is well-developed. She is not ill-appearing or diaphoretic.  HENT:     Head: Normocephalic and atraumatic.     Right Ear: External ear normal.     Left Ear: External ear normal.     Nose: Nose normal.  Eyes:     General:        Right eye: No discharge.        Left eye: No discharge.  Cardiovascular:     Rate and Rhythm: Normal rate and regular rhythm.     Heart sounds: Normal heart sounds.   Pulmonary:     Effort: Pulmonary effort  is normal.     Breath sounds: Normal breath sounds.  Abdominal:     Palpations: Abdomen is soft.     Tenderness: There is no abdominal tenderness.  Musculoskeletal:     Right lower leg: No tenderness. No edema.     Left lower leg: No tenderness. No edema.  Skin:    General: Skin is warm and dry.  Neurological:     Mental Status: She is alert.  Psychiatric:        Mood and Affect: Mood is not anxious.    ED Results / Procedures / Treatments   Labs (all labs ordered are listed, but only abnormal results are displayed) Labs Reviewed  BASIC METABOLIC PANEL - Abnormal; Notable for the following components:      Result Value   BUN 24 (*)    All other components within normal limits  CBC - Abnormal; Notable for the following components:   WBC 12.3 (*)    All other components within normal limits  TROPONIN I (HIGH SENSITIVITY)  TROPONIN I (HIGH SENSITIVITY)    EKG EKG Interpretation  Date/Time:  Sunday March 31 2021 19:55:28 EDT Ventricular Rate:  68 PR Interval:  128 QRS Duration: 90 QT Interval:  392 QTC Calculation: 416 R Axis:   49 Text Interpretation: Normal sinus rhythm no acute ST/T changes Confirmed by Sherwood Gambler [phone]) on 03/31/2021 8:07:30 PM  Radiology DG Chest Portable 1 View  Result Date: 03/31/2021 CLINICAL DATA:  Chest pain EXAM: PORTABLE CHEST 1 VIEW COMPARISON:  10/29/2018 FINDINGS: The heart size and mediastinal contours are within normal limits. Both lungs are clear. The visualized skeletal structures are unremarkable. IMPRESSION: No active disease. Electronically Signed   By: Ulyses Jarred M.D.   On: 03/31/2021 20:46    Procedures Ultrasound ED Echo  Date/Time: 03/31/2021 9:04 PM Performed by: Sherwood Gambler, MD Authorized by: Sherwood Gambler, MD   Procedure details:    Indications: chest pain     Views: subxiphoid and parasternal long axis view     Images: archived     Limitations:  Acoustic  shadowing and body habitus Findings:    Pericardium: no pericardial effusion     Pericardium comment:  No effusion or perhaps trivial   LV Function: normal (>50% EF)   Impression:    Impression: normal     Medications Ordered in ED Medications  ketorolac (TORADOL) 15 MG/ML injection 15 mg (15 mg Intravenous Given 03/31/21 2109)  colchicine tablet 0.6 mg (0.6 mg Oral Given 03/31/21 2222)    ED Course  I have reviewed the triage vital signs and the nursing notes.  Pertinent labs & imaging results that were available during my care of the patient were reviewed by me and considered in my medical decision making (see chart for details).    MDM Rules/Calculators/A&P                           Presentation is most consistent with pericarditis, which she has had before.  She is not tachycardic, hypotensive or hypoxic and while she does have pleuritic pain my suspicion for PE is pretty low in this presentation.  She is feeling better with Toradol.  I did a limited bedside echo which does not show an obvious pericardial effusion besides perhaps a trivial 1.  Troponins are negative.  A second troponin was obtained a little too early but her symptoms have been present for 6+ hours with increased pain and  so I think with a negative troponin originally she does not need further testing.  Highly doubt ACS or myocarditis.  Will give NSAIDs and colchicine and have her follow-up with PCP and cardiology. Final Clinical Impression(s) / ED Diagnoses Final diagnoses:  Acute pericarditis, unspecified type    Rx / DC Orders ED Discharge Orders          Ordered    ibuprofen (ADVIL) 800 MG tablet  3 times daily        03/31/21 2258    colchicine 0.6 MG tablet  2 times daily        03/31/21 2258             Sherwood Gambler, MD 03/31/21 2307

## 2021-03-31 NOTE — ED Triage Notes (Signed)
Pt is present for centralized chest "tightness" for the last four days. Pt states the pain is worse when she tries to lie flat and leaning forward, as well as taking a deep breath. Pt originally thought it was indigestion but has a hx of pericarditis. Pt will have relief when she takes ibuprofen but sx will shortly return.

## 2021-03-31 NOTE — Discharge Instructions (Signed)
If you develop recurrent, continued, or worsening chest pain, shortness of breath, fever, vomiting, abdominal or back pain, or any other new/concerning symptoms then return to the ER for evaluation.  

## 2021-04-01 ENCOUNTER — Encounter (INDEPENDENT_AMBULATORY_CARE_PROVIDER_SITE_OTHER): Payer: Self-pay

## 2021-04-04 ENCOUNTER — Ambulatory Visit (INDEPENDENT_AMBULATORY_CARE_PROVIDER_SITE_OTHER): Payer: BC Managed Care – PPO | Admitting: Physician Assistant

## 2021-04-08 ENCOUNTER — Other Ambulatory Visit: Payer: Self-pay

## 2021-04-08 ENCOUNTER — Ambulatory Visit
Admission: RE | Admit: 2021-04-08 | Discharge: 2021-04-08 | Disposition: A | Discharge status: Home / Self Care | Payer: BC Managed Care – PPO | Source: Ambulatory Visit | Attending: Obstetrics & Gynecology | Admitting: Obstetrics & Gynecology

## 2021-04-08 DIAGNOSIS — Z1231 Encounter for screening mammogram for malignant neoplasm of breast: Secondary | ICD-10-CM

## 2021-04-09 ENCOUNTER — Telehealth (INDEPENDENT_AMBULATORY_CARE_PROVIDER_SITE_OTHER): Payer: BC Managed Care – PPO | Admitting: Psychology

## 2021-04-09 ENCOUNTER — Telehealth (INDEPENDENT_AMBULATORY_CARE_PROVIDER_SITE_OTHER): Payer: Self-pay | Admitting: Psychology

## 2021-04-09 NOTE — Telephone Encounter (Signed)
  Office: [phone]  /  Fax: [phone]  Date of Call: April 09, 2021  Time of Call: 12:32pm Provider: Glennie Isle, PsyD  CONTENT: This provider called [name] to check-in as she did not present for today's MyChart Video Visit appointment at 12:30pm. A HIPAA compliant voicemail requesting a call back could not be left as her mailbox was full. Of note, this provider stayed on the MyChart Video Visit appointment for 5 minutes prior to signing off per the clinic's grace period policy.    PLAN: Front desk will send Erika a [employer] regarding today's missed appointment.This provider will wait for Kourtlyn to call back. No further follow-up planned by this provider.

## 2021-04-11 ENCOUNTER — Ambulatory Visit (INDEPENDENT_AMBULATORY_CARE_PROVIDER_SITE_OTHER): Payer: BC Managed Care – PPO | Admitting: Nurse Practitioner

## 2021-04-11 ENCOUNTER — Encounter: Payer: Self-pay | Admitting: Nurse Practitioner

## 2021-04-11 ENCOUNTER — Other Ambulatory Visit: Payer: Self-pay

## 2021-04-11 VITALS — BP 120/76 | Ht 66.0 in | Wt 189.0 lb

## 2021-04-11 DIAGNOSIS — Z780 Asymptomatic menopausal state: Secondary | ICD-10-CM | POA: Diagnosis not present

## 2021-04-11 DIAGNOSIS — J302 Other seasonal allergic rhinitis: Secondary | ICD-10-CM | POA: Diagnosis not present

## 2021-04-11 DIAGNOSIS — Z01419 Encounter for gynecological examination (general) (routine) without abnormal findings: Secondary | ICD-10-CM | POA: Diagnosis not present

## 2021-04-11 DIAGNOSIS — R946 Abnormal results of thyroid function studies: Secondary | ICD-10-CM | POA: Diagnosis not present

## 2021-04-11 DIAGNOSIS — Z90710 Acquired absence of both cervix and uterus: Secondary | ICD-10-CM | POA: Diagnosis not present

## 2021-04-11 DIAGNOSIS — I319 Disease of pericardium, unspecified: Secondary | ICD-10-CM | POA: Diagnosis not present

## 2021-04-11 DIAGNOSIS — K219 Gastro-esophageal reflux disease without esophagitis: Secondary | ICD-10-CM | POA: Diagnosis not present

## 2021-04-11 NOTE — Progress Notes (Signed)
   [name] 1971/01/13 [id number]   History:  50 y.o. G2P1102 presents for annual exam without GYN complaints. Postmenopausal - no HRT. She has mild menopausal symptoms. S/P 2017 LAVH with LSO and right salpingectomy for endometriosis.   Gynecologic History Patient's last menstrual period was 09/11/2015.   Contraception/Family planning: status post hysterectomy Sexually active: Yes  Health Maintenance Last Pap: 11/26/2017. Results were: Normal Last mammogram: 04/08/2021. Results were: Normal Last colonoscopy: Never Last Dexa: Not indicated  Past medical history, past surgical history, family history and social history were all reviewed and documented in the EPIC chart. 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 Son graduating this year from [employer] in [employer]. Daughter in 11th grade.  ROS:  A ROS was performed and pertinent positives and negatives are included.  Exam:  Vitals:   04/11/21 0921  BP: 120/76  Weight: 189 lb (85.7 kg)  Height: '5\' 6"'$  (1.676 m)   Body mass index is 30.51 kg/m.  General appearance:  Normal Thyroid:  Symmetrical, normal in size, without palpable masses or nodularity. Respiratory  Auscultation:  Clear without wheezing or rhonchi Cardiovascular  Auscultation:  Regular rate, without rubs, murmurs or gallops  Edema/varicosities:  Not grossly evident Abdominal  Soft,nontender, without masses, guarding or rebound.  Liver/spleen:  No organomegaly noted  Hernia:  None appreciated  Skin  Inspection:  Grossly normal Breasts: Examined lying and sitting.   Right: Without masses, retractions, nipple discharge or axillary adenopathy.   Left: Without masses, retractions, nipple discharge or axillary adenopathy. Genitourinary   Inguinal/mons:  Normal without inguinal adenopathy  External genitalia:  Normal appearing vulva with no masses, tenderness, or lesions  BUS/Urethra/Skene's glands:  Normal  Vagina:  Normal appearing  with normal color and discharge, no lesions  Cervix:  Absent  Uterus:  Absent  Adnexa/parametria:     Rt: Normal in size, without masses or tenderness.   Lt: Normal in size, without masses or tenderness.  Anus and perineum: Normal  Digital rectal exam: Normal sphincter tone without palpated masses or tenderness  Patient informed chaperone available to be present for breast and pelvic exam. Patient has requested no chaperone to be present. Patient has been advised what will be completed during breast and pelvic exam.   Assessment/Plan:  50 y.o. G2P1102 for annual exam.   Well female exam with routine gynecological exam - Education provided on SBEs, importance of preventative screenings, current guidelines, high calcium diet, regular exercise, and multivitamin daily. Labs with PCP.   History of LAVH with LSO and right salpingectomy - 2002 for endometriosis  Postmenopausal - no HRT. Mild menopausal symptoms.   Screening for cervical cancer - Normal Pap history. No longer screening per guidelines.   Screening for breast cancer - Normal mammogram history.  Continue annual screenings.  Normal breast exam today.  Screening for colon cancer - Has not had screening colonoscopy. Discussed current guidelines and importance of preventative screenings. Information provided on [facility].   Return in 1 year for annual.   Tamela Gammon DNP, 10:26 AM 04/11/2021

## 2021-04-11 NOTE — Patient Instructions (Signed)
[facility] [phone] [place], [place] [phone]

## 2021-04-30 ENCOUNTER — Encounter (HOSPITAL_BASED_OUTPATIENT_CLINIC_OR_DEPARTMENT_OTHER): Payer: Self-pay | Admitting: Cardiovascular Disease

## 2021-04-30 ENCOUNTER — Ambulatory Visit (HOSPITAL_BASED_OUTPATIENT_CLINIC_OR_DEPARTMENT_OTHER): Payer: BC Managed Care – PPO | Admitting: Cardiovascular Disease

## 2021-04-30 ENCOUNTER — Other Ambulatory Visit: Payer: Self-pay

## 2021-04-30 VITALS — BP 118/78 | HR 72 | Ht 66.5 in | Wt 192.2 lb

## 2021-04-30 DIAGNOSIS — I319 Disease of pericardium, unspecified: Secondary | ICD-10-CM

## 2021-04-30 DIAGNOSIS — I300 Acute nonspecific idiopathic pericarditis: Secondary | ICD-10-CM

## 2021-04-30 NOTE — Progress Notes (Signed)
Cardiology Office Note  Date:  04/30/2021   ID:  [name], DOB [birth year], MRN [id number]  PCP:  Ileana Ladd, MD  Cardiologist:   Chilton Si, MD   No chief complaint on file.  History of Present Illness: [name] is a 50 y.o. female with pericarditis and hyperlipidemia who presents for follow up.  [name] was admitted 04/2018 with chest pain.  She has a history of palpitations that she attributes to stress at work.  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  However, for the week before she went to the hospital she noted feeling more anxious.  She had no URI at the time.  She was seen in the emergency department where EKG was initially concerning for STEMI.  Troponin was elevated to 0.75 and ESR was 30.  She was thought to have myopericarditis and was started on ibuprofen and colchicine.  She was also given a prescription for oxycodone.  She followed up in clinic 9/12 and was doing better but still had some pleuritic chest pain.  She started to become more active but developed recurrent chest pain.  She had a repeat echocardiogram 05/14/2018 that revealed LVEF 60 to 65% with no pericardial effusion.  She did have a left pleural effusion that was unchanged from the hospital.  She followed up with her rheumatologist, Dr. Zenovia Jordan, 04/2018.  She was noted to have a mildly positive rheumatoid factor, ESR, and CRP.  She tried tapering her ibuprofen and had recurrent pain after decreasing to twice a day.  It was decided that she would resume both ibuprofen and colchicine for the holidays.    [name] was seen by Joni Reining, DNP on 05/2019.  She was doing well at that time.  She saw her rheumatologist and started back on colchicine, ibuprofen and nebivolol.  It has subsequently been discontinued.  Overall she has felt well physically.  She had an episode of chest tightness that occurred with stress.  She had a coronary CT-A  04/2020 that revealed no coronary calcium and no CAD.  [name] was seen in the ED 03/2021 with chest pain like her prior pericarditis.  She felt tightness in her chest and it was worse laying down.  She had a limited bedside echocardiogram by the ED physician and was not noted to have a pericardial effusion.  Cardiac enzymes were negative.  Her symptoms improved with Toradol.  She was started back on colchicine and instructed to follow back with cardiology.  She didn't have any preceding viral infections.  Her breathing has been OK.  Her chest pain has been improving with the colchicine.     Past Medical History:  Diagnosis Date   Acne    Anemia    Anxiety    Chest pain    Constipation    Depression    Elevated troponin 04/28/2018   Endometriosis    Food allergy    HELLP (hemolytic anemia/elev liver enzymes/low platelets in pregnancy)    hx of two pregnancies   Hypertension    Joint pain    Kidney problem    Lactose intolerance    Leg cramps    Low back pain    Miscarriage    Normal spontaneous vaginal delivery    Obesity    Obesity (BMI 30.0-34.9) 11/07/2020   Other fatigue    Palpitation    Pericarditis    Sleep apnea  SOB (shortness of breath) on exertion    Thyroid dysfunction    Vitamin B 12 deficiency    Vitamin D deficiency     Past Surgical History:  Procedure Laterality Date   ABDOMINOPLASTY     BILATERAL SALPINGECTOMY Right 10/16/2015   Procedure: RIGHT  SALPINGECTOMY;  Surgeon: Ok Edwards, MD;  Location: [facility];  Service: Gynecology;  Laterality: Right;   BREAST EXCISIONAL BIOPSY Left 2021   BUNIONECTOMY Right 11/30/2015   @ [facility]   BUNIONECTOMY Left 12/21/2015   @ [facility]   BUNIONECTOMY Left 03/07/2016   @[facility]     CERVICAL ABLATION     CESAREAN SECTION     CYSTOSCOPY N/A 10/16/2015   Procedure: CYSTOSCOPY;  Surgeon: Ok Edwards, MD;  Location: [facility];  Service: Gynecology;  Laterality: N/A;   DILATION AND CURETTAGE OF UTERUS     LAPAROSCOPIC  VAGINAL HYSTERECTOMY WITH SALPINGO OOPHORECTOMY Left 10/16/2015   Procedure: LAPAROSCOPIC ASSISTED VAGINAL HYSTERECTOMY WITH SALPINGO OOPHORECTOMY;  Surgeon: Ok Edwards, MD;  Location: [facility];  Service: Gynecology;  Laterality: Left;   TUBAL LIGATION       Current Outpatient Medications  Medication Sig Dispense Refill   Cholecalciferol (VITAMIN D3) 50 MCG (2000 UT) TABS Take 4,000 Int'l Units by mouth daily. 30 tablet 0   colchicine 0.6 MG tablet Take 1 tablet (0.6 mg total) by mouth 2 (two) times daily. 30 tablet 1   Cyanocobalamin (VITAMIN B-12 PO) Take 5,000 mcg by mouth daily.     ibuprofen (ADVIL) 800 MG tablet Take 1 tablet (800 mg total) by mouth 3 (three) times daily. 21 tablet 0   Multiple Vitamins-Minerals (CENTRUM SILVER 50+WOMEN) TABS Take 1 tablet by mouth daily.     NUTRITIONAL SUPPLEMENTS PO Take by mouth. BIO-X4     TURMERIC PO Take 2,000 mg by mouth.     No current facility-administered medications for this visit.    Allergies:   Iodinated diagnostic agents, Iodine, Other, and Shellfish allergy    Social History:  The patient  reports that she has never smoked. She has never used smokeless tobacco. She reports current alcohol use. She reports that she does not use drugs.   Family History:  [name] family history includes Anxiety disorder in her father; Cancer in her brother, father, and mother; Cataracts in her father; Diabetes in her brother, maternal grandmother, and mother; Heart disease in her maternal grandfather; Heart failure in her father and maternal grandfather; Hyperlipidemia in her mother; Hypertension in her mother; Kidney failure in her maternal grandmother; Lymphoma in her father; Obesity in her mother; Sickle cell anemia in her maternal aunt; Stroke in her maternal grandfather; Thyroid disease in her mother.    ROS:  Please see the history of present illness.   Otherwise, review of systems are positive for none.   All other systems are reviewed  and negative.    PHYSICAL EXAM: VS:  BP 118/78   Pulse 72   Ht 5' 6.5" (1.689 m)   Wt 192 lb 3.2 oz (87.2 kg)   LMP 09/11/2015   SpO2 93%   BMI 30.56 kg/m  , BMI Body mass index is 30.56 kg/m. GENERAL:  Well appearing HEENT: Pupils equal round and reactive, fundi not visualized, oral mucosa unremarkable NECK:  No jugular venous distention, waveform within normal limits, carotid upstroke brisk and symmetric, no bruits LUNGS:  Clear to auscultation bilaterally HEART:  RRR.  PMI not displaced or sustained,S1 and S2 within normal limits, no S3,  no S4, no clicks, no rubs, no murmurs ABD:  Flat, positive bowel sounds normal in frequency in pitch, no bruits, no rebound, no guarding, no midline pulsatile mass, no hepatomegaly, no splenomegaly EXT:  2 plus pulses throughout, no edema, no cyanosis no clubbing SKIN:  No rashes no nodules NEURO:  Cranial nerves II through XII grossly intact, motor grossly intact throughout PSYCH:  Cognitively intact, oriented to person place and time  EKG:  EKG is not ordered today. 05/10/18: Sinus rhythm.  Rate 80 bpm.  Mind inferior, anterior and lateral ST elevation.  PVC 05/03/20: Sinus rhythm. Rate 65 bpm.  11/07/2020: Sinus rhythm.  Rate 65 bpm.  First-degree AV block.  LVH with secondary repolarization abnormality.  LAFB.  Echo 04/29/18: Study Conclusions   - Left ventricle: The cavity size was normal. Wall thickness was   increased in a pattern of mild LVH. Systolic function was normal.   The estimated ejection fraction was in the range of 60% to 65%.   Wall motion was normal; there were no regional wall motion   abnormalities. Features are consistent with a pseudonormal left   ventricular filling pattern, with concomitant abnormal relaxation   and increased filling pressure (grade 2 diastolic dysfunction). - Pulmonary arteries: Systolic pressure was mildly increased. PA   peak pressure: 39 mm Hg (S). - Pericardium, extracardiac: A trivial pericardial  effusion was   identified.  Echo 05/14/18: Study Conclusions   - Left ventricle: The cavity size was normal. There was mild   concentric hypertrophy. Systolic function was normal. The   estimated ejection fraction was in the range of 60% to 65%. Wall   motion was normal; there were no regional wall motion   abnormalities. Left ventricular diastolic function parameters   were normal. - Aortic valve: There was no regurgitation. - Left atrium: The atrium was normal in size. - Right ventricle: Systolic function was normal. - Right atrium: The atrium was normal in size. - Tricuspid valve: There was mild regurgitation. - Pulmonary arteries: Systolic pressure was within the normal   range. - Inferior vena cava: The vessel was normal in size. - Pericardium, extracardiac: There was no pericardial effusion.   There was a left pleural effusion.   Impressions:   - There is no significant change since the prior study on 04/28/2018,   there is no pericardial effusion and stable left pleural   effusion.  Recent Labs: 12/18/2020: ALT 24 02/13/2021: TSH 0.586 03/31/2021: BUN 24; Creatinine, Ser 0.81; Hemoglobin 13.0; Platelets 257; Potassium 3.8; Sodium 139    Lipid Panel    Component Value Date/Time   CHOL 220 (H) 12/18/2020 1123   TRIG 65 12/18/2020 1123   HDL 71 12/18/2020 1123   CHOLHDL 3.3 05/17/2020 1527   CHOLHDL 2.1 04/28/2018 0047   VLDL 8 04/28/2018 0047   LDLCALC 138 (H) 12/18/2020 1123   LDLCALC 100 (H) 11/26/2017 0843      Wt Readings from Last 3 Encounters:  04/30/21 192 lb 3.2 oz (87.2 kg)  04/11/21 189 lb (85.7 kg)  03/31/21 187 lb (84.8 kg)      ASSESSMENT AND PLAN:  # Myopericarditis:  She has recurrent pericarditis.  Her symptoms are typical.  It is unclear what is causing this.  She had an autoimmune work-up which was unremarkable.  Given that there was some concern for pericardial effusion on her bedside echo, we will get a full echo.  Continue colchicine for 3  months.  She has not needed any ibuprofen  but can take this as needed.  # Atypical chest pain:  Resolved.  Symptoms were atypical.  Her L arm and leg pain are attributable to cervical disc disease.  Coronary CT-A was negative for CAD on 04/2020.      Current medicines are reviewed at length with the patient today.  The patient does not have concerns regarding medicines.  The following changes have been made:  none  Labs/ tests ordered today include:   Orders Placed This Encounter  Procedures   ECHOCARDIOGRAM COMPLETE   As needed. Disposition:   FU with Sariya Trickey C. Duke Salvia, MD, [facility] in 4 months  Signed, Ausar Georgiou C. Duke Salvia, MD, [facility]  04/30/2021 4:48 PM    [facility]

## 2021-04-30 NOTE — Assessment & Plan Note (Signed)
[name] has recurrent pericarditis.  Symptoms are improving on colchicine.  We will continue for 3 months.  She can start taking it just once a day.  Continue to take ibuprofen as needed, though she has not needed any lately.  Given that she reportedly had trivial pericardial fluid on her bedside echo, we will get a full echocardiogram.

## 2021-04-30 NOTE — Patient Instructions (Signed)
Medication Instructions:  Continue current medications  *If you need a refill on your cardiac medications before your next appointment, please call your pharmacy*   Lab Work: None Ordered   Testing/Procedures: Your physician has requested that you have an echocardiogram. Echocardiography is a painless test that uses sound waves to create images of your heart. It provides your doctor with information about the size and shape of your heart and how well your heart's chambers and valves are working. This procedure takes approximately one hour. There are no restrictions for this procedure.    Follow-Up: At [facility], you and your health needs are our priority.  As part of our continuing mission to provide you with exceptional heart care, we have created designated Provider Care Teams.  These Care Teams include your primary Cardiologist (physician) and Advanced Practice Providers (APPs -  Physician Assistants and Nurse Practitioners) who all work together to provide you with the care you need, when you need it.  We recommend signing up for the patient portal called "MyChart".  Sign up information is provided on this After Visit Summary.  MyChart is used to connect with patients for Virtual Visits (Telemedicine).  Patients are able to view lab/test results, encounter notes, upcoming appointments, etc.  Non-urgent messages can be sent to your provider as well.   To learn more about what you can do with MyChart, go to [email or web address].    Your next appointment:   4 month(s)  The format for your next appointment:   In Person  Provider:   Skeet Latch, MD

## 2021-05-03 ENCOUNTER — Other Ambulatory Visit: Payer: Self-pay

## 2021-05-03 ENCOUNTER — Ambulatory Visit (INDEPENDENT_AMBULATORY_CARE_PROVIDER_SITE_OTHER): Payer: BC Managed Care – PPO

## 2021-05-03 DIAGNOSIS — I319 Disease of pericardium, unspecified: Secondary | ICD-10-CM | POA: Diagnosis not present

## 2021-05-03 LAB — ECHOCARDIOGRAM COMPLETE
Area-P 1/2: 3.48 cm2
Calc EF: 56.2 %
S' Lateral: 3.11 cm
Single Plane A2C EF: 54.2 %
Single Plane A4C EF: 56.4 %

## 2021-05-06 DIAGNOSIS — I319 Disease of pericardium, unspecified: Secondary | ICD-10-CM | POA: Diagnosis not present

## 2021-05-06 DIAGNOSIS — K1120 Sialoadenitis, unspecified: Secondary | ICD-10-CM | POA: Diagnosis not present

## 2021-05-06 DIAGNOSIS — R635 Abnormal weight gain: Secondary | ICD-10-CM | POA: Diagnosis not present

## 2021-05-06 DIAGNOSIS — K219 Gastro-esophageal reflux disease without esophagitis: Secondary | ICD-10-CM | POA: Diagnosis not present

## 2021-06-07 ENCOUNTER — Ambulatory Visit (HOSPITAL_BASED_OUTPATIENT_CLINIC_OR_DEPARTMENT_OTHER): Payer: BC Managed Care – PPO | Admitting: Cardiovascular Disease

## 2021-07-16 ENCOUNTER — Ambulatory Visit: Payer: BC Managed Care – PPO | Admitting: Obstetrics & Gynecology

## 2021-07-16 ENCOUNTER — Ambulatory Visit: Payer: BC Managed Care – PPO | Admitting: Nurse Practitioner

## 2021-07-16 DIAGNOSIS — Z0289 Encounter for other administrative examinations: Secondary | ICD-10-CM

## 2021-07-17 ENCOUNTER — Other Ambulatory Visit: Payer: Self-pay

## 2021-07-17 ENCOUNTER — Ambulatory Visit (INDEPENDENT_AMBULATORY_CARE_PROVIDER_SITE_OTHER): Payer: BC Managed Care – PPO | Admitting: Nurse Practitioner

## 2021-07-17 ENCOUNTER — Encounter: Payer: Self-pay | Admitting: Nurse Practitioner

## 2021-07-17 VITALS — BP 126/88 | HR 60 | Temp 98.0°F

## 2021-07-17 DIAGNOSIS — N898 Other specified noninflammatory disorders of vagina: Secondary | ICD-10-CM | POA: Diagnosis not present

## 2021-07-17 DIAGNOSIS — Z23 Encounter for immunization: Secondary | ICD-10-CM

## 2021-07-17 DIAGNOSIS — R3915 Urgency of urination: Secondary | ICD-10-CM

## 2021-07-17 DIAGNOSIS — N760 Acute vaginitis: Secondary | ICD-10-CM

## 2021-07-17 DIAGNOSIS — B9689 Other specified bacterial agents as the cause of diseases classified elsewhere: Secondary | ICD-10-CM

## 2021-07-17 LAB — WET PREP FOR TRICH, YEAST, CLUE

## 2021-07-17 MED ORDER — METRONIDAZOLE 500 MG PO TABS: 500.0000 mg | ORAL_TABLET | Freq: Two times a day (BID) | ORAL | 0 refills | Status: DC

## 2021-07-17 NOTE — Progress Notes (Signed)
   Acute Office Visit  Subjective:    Patient ID: [name], female    DOB: 1971-01-05, 50 y.o.   MRN: [id number]   HPI 50 y.o. presents today for urinary frequency, urgency, pelvic pressure, and mild burning with urination. She thinks she also has some vaginal odor. Denies discharge or itching. She feels the urgency is not new and usually occurs when she has not voided in a long time. She will even leak if she does not go soon enough.    Review of Systems  Constitutional: Negative.   Genitourinary:  Positive for dysuria, frequency and urgency. Negative for vaginal discharge and vaginal pain.       Vaginal odor      Objective:    Physical Exam Constitutional:      Appearance: Normal appearance.  Genitourinary:    General: Normal vulva.     Vagina: Vaginal discharge present. No erythema.    BP 126/88   Pulse 60   Temp 98 F (36.7 C)   LMP 09/11/2015   SpO2 99%  Wt Readings from Last 3 Encounters:  04/30/21 192 lb 3.2 oz (87.2 kg)  04/11/21 189 lb (85.7 kg)  03/31/21 187 lb (84.8 kg)   Wet prep + clue cells UA negative     Assessment & Plan:   Problem List Items Addressed This Visit   None Visit Diagnoses     Bacterial vaginosis    -  Primary   Relevant Medications   metroNIDAZOLE (FLAGYL) 500 MG tablet   Urinary urgency       Relevant Orders   Urinalysis,Complete w/RFL Culture   Vaginal odor       Relevant Orders   WET PREP FOR [place], YEAST, CLUE   Need for immunization against influenza       Relevant Orders   Flu Vaccine QUAD 7mo+IM (Fluarix, Fluzone & Alfiuria Quad PF) (Completed)      Plan: Wet prep positive for clue cells - Flagyl 500 mg BID x 7 days. UA unremarkable. Will return if symptoms worsen or do not improve.      Tamela Gammon DNP, 10:58 AM 07/17/2021

## 2021-07-19 LAB — URINALYSIS, COMPLETE W/RFL CULTURE
Bilirubin Urine: NEGATIVE
Crystals: NONE SEEN /HPF
Glucose, UA: NEGATIVE
Hgb urine dipstick: NEGATIVE
Ketones, ur: NEGATIVE
Leukocyte Esterase: NEGATIVE
Nitrites, Initial: NEGATIVE
Protein, ur: NEGATIVE
RBC / HPF: NONE SEEN /HPF (ref 0–2)
Specific Gravity, Urine: 1.02 (ref 1.001–1.035)
Yeast: NONE SEEN /HPF
pH: 7.5 (ref 5.0–8.0)

## 2021-07-19 LAB — URINE CULTURE
MICRO NUMBER:: 12675271
SPECIMEN QUALITY:: ADEQUATE

## 2021-07-19 LAB — CULTURE INDICATED

## 2021-08-05 DIAGNOSIS — E7800 Pure hypercholesterolemia, unspecified: Secondary | ICD-10-CM | POA: Diagnosis not present

## 2021-08-05 DIAGNOSIS — Z23 Encounter for immunization: Secondary | ICD-10-CM | POA: Diagnosis not present

## 2021-08-05 DIAGNOSIS — Z8679 Personal history of other diseases of the circulatory system: Secondary | ICD-10-CM | POA: Diagnosis not present

## 2021-08-05 DIAGNOSIS — Z79899 Other long term (current) drug therapy: Secondary | ICD-10-CM | POA: Diagnosis not present

## 2021-08-05 DIAGNOSIS — Z1211 Encounter for screening for malignant neoplasm of colon: Secondary | ICD-10-CM | POA: Diagnosis not present

## 2021-09-04 NOTE — Progress Notes (Signed)
Cardiology Office Note  Date:  09/11/2021   ID:  [name], DOB 1971/01/21, MRN [id number]  PCP:  Ileana Ladd, MD  Cardiologist:   Chilton Si, MD   No chief complaint on file.  History of Present Illness: Kaley Benett is a 51 y.o. female with pericarditis and hyperlipidemia who presents for follow up.  Ms. Gieck was admitted 04/2018 with chest pain.  She has a history of palpitations that she attributes to stress at work.  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  However, for the week before she went to the hospital she noted feeling more anxious.  She had no URI at the time.  She was seen in the emergency department where EKG was initially concerning for STEMI.  Troponin was elevated to 0.75 and ESR was 30.  She was thought to have myopericarditis and was started on ibuprofen and colchicine.  She was also given a prescription for oxycodone.  She followed up in clinic 9/12 and was doing better but still had some pleuritic chest pain.  She started to become more active but developed recurrent chest pain.  She had a repeat echocardiogram 05/14/2018 that revealed LVEF 60 to 65% with no pericardial effusion.  She did have a left pleural effusion that was unchanged from the hospital.  She followed up with her rheumatologist, Dr. Zenovia Jordan, 04/2018.  She was noted to have a mildly positive rheumatoid factor, ESR, and CRP.  She tried tapering her ibuprofen and had recurrent pain after decreasing to twice a day.  It was decided that she would resume both ibuprofen and colchicine for the holidays.    Ms. Waxler was seen by Joni Reining, DNP on 05/2019.  She was doing well at that time.  She saw her rheumatologist and started back on colchicine, ibuprofen and nebivolol.  It has subsequently been discontinued.  Overall she has felt well physically.  She had an episode of chest tightness that occurred with stress.  She had a coronary CT-A  04/2020 that revealed no coronary calcium and no CAD.  Ms. Geraty was seen in the ED 03/2021 with chest pain like her prior pericarditis.  She felt tightness in her chest and it was worse laying down.  She had a limited bedside echocardiogram by the ED physician and was not noted to have a pericardial effusion.  Cardiac enzymes were negative.  Her symptoms improved with Toradol.  She was started back on colchicine. By the time she followed up in clinic the next month she was feeling better. She had a full Echo 04/2021 that was unremarkable and had no pericardial effusion.   Today, she is feeling good overall. Since 05/2021 her chest discomfort has resolved, she has more energy, and she has noticed improvement concerning her back issues. She attributes these improvements to no longer eating meat, and following a plant-based diet. She has lost some weight, but is not formally exercising. Now that she has improved her diet, she plans on working on this more and increasing her formal exercise. She has not needed to use her colchicine or ibuprofen, but still has them to take if needed. Of note, she states it was not recommended that she receive a COVID booster. However, she has received her flu and shingles vaccinations. She denies any palpitations, chest pain, or shortness of breath. No lightheadedness, headaches, syncope, orthopnea, PND, lower extremity edema or exertional symptoms.   Past Medical History:  Diagnosis Date   Acne    Anemia    Anxiety    Chest pain    Constipation    Depression    Elevated troponin 04/28/2018   Endometriosis    Food allergy    HELLP (hemolytic anemia/elev liver enzymes/low platelets in pregnancy)    hx of two pregnancies   Hypertension    Joint pain    Kidney problem    Lactose intolerance    Leg cramps    Low back pain    Miscarriage    Normal spontaneous vaginal delivery    Obesity    Obesity (BMI 30.0-34.9) 11/07/2020   Other fatigue    Palpitation     Pericarditis    Pure hypercholesterolemia 09/05/2021   Sleep apnea    SOB (shortness of breath) on exertion    Thyroid dysfunction    Vitamin B 12 deficiency    Vitamin D deficiency     Past Surgical History:  Procedure Laterality Date   ABDOMINOPLASTY     BILATERAL SALPINGECTOMY Right 10/16/2015   Procedure: RIGHT  SALPINGECTOMY;  Surgeon: Ok Edwards, MD;  Location: [facility];  Service: Gynecology;  Laterality: Right;   BREAST EXCISIONAL BIOPSY Left 2021   BUNIONECTOMY Right 11/30/2015   @ [facility]   BUNIONECTOMY Left 12/21/2015   @ [facility]   BUNIONECTOMY Left 03/07/2016   @[facility]     CERVICAL ABLATION     CESAREAN SECTION     CYSTOSCOPY N/A 10/16/2015   Procedure: CYSTOSCOPY;  Surgeon: Ok Edwards, MD;  Location: [facility];  Service: Gynecology;  Laterality: N/A;   DILATION AND CURETTAGE OF UTERUS     LAPAROSCOPIC VAGINAL HYSTERECTOMY WITH SALPINGO OOPHORECTOMY Left 10/16/2015   Procedure: LAPAROSCOPIC ASSISTED VAGINAL HYSTERECTOMY WITH SALPINGO OOPHORECTOMY;  Surgeon: Ok Edwards, MD;  Location: [facility];  Service: Gynecology;  Laterality: Left;   TUBAL LIGATION      Current Outpatient Medications  Medication Sig Dispense Refill   Cholecalciferol (VITAMIN D3) 50 MCG (2000 UT) TABS Take 4,000 Int'l Units by mouth daily. 30 tablet 0   Coenzyme Q10 (COQ10) 50 MG CAPS See admin instructions.     colchicine 0.6 MG tablet Take 1 tablet (0.6 mg total) by mouth 2 (two) times daily. (Patient taking differently: Take 0.6 mg by mouth as needed.) 30 tablet 1   COLLAGEN PO Take 6,000 mg by mouth daily.     Cyanocobalamin (VITAMIN B-12 PO) Take 5,000 mcg by mouth daily.     EPINEPHrine 0.3 mg/0.3 mL IJ SOAJ injection Inject into the muscle.     ibuprofen (ADVIL) 800 MG tablet Take 1 tablet (800 mg total) by mouth 3 (three) times daily. 21 tablet 0   Multiple Vitamins-Minerals (CENTRUM SILVER 50+WOMEN) TABS Take 1 tablet by mouth daily.     NUTRITIONAL SUPPLEMENTS PO Take by mouth. BIO-X4      TURMERIC PO Take 2,000 mg by mouth.     No current facility-administered medications for this visit.    Allergies:   Iodinated contrast media, Iodine, Other, and Shellfish allergy    Social History:  The patient  reports that she has never smoked. She has never used smokeless tobacco. She reports current alcohol use. She reports that she does not use drugs.   Family History:  [name] family history includes Anxiety disorder in her father; Cancer in her brother, father, and mother; Cataracts in her father; Diabetes in her brother, maternal grandmother, and mother; Heart disease in her maternal grandfather; Heart  failure in her father and maternal grandfather; Hyperlipidemia in her mother; Hypertension in her mother; Kidney failure in her maternal grandmother; Lymphoma in her father; Obesity in her mother; Sickle cell anemia in her maternal aunt; Stroke in her maternal grandfather; Thyroid disease in her mother.    ROS:   Please see the history of present illness. All other systems are reviewed and negative.    PHYSICAL EXAM: VS:  BP (!) 126/92 (BP Location: Right Arm, Patient Position: Sitting, Cuff Size: Large)    Pulse 63    Ht 5' 6.5" (1.689 m)    Wt 187 lb (84.8 kg)    LMP 09/11/2015    SpO2 94%    BMI 29.73 kg/m  , BMI Body mass index is 29.73 kg/m. GENERAL:  Well appearing HEENT: Pupils equal round and reactive, fundi not visualized, oral mucosa unremarkable NECK:  No jugular venous distention, waveform within normal limits, carotid upstroke brisk and symmetric, no bruits LUNGS:  Clear to auscultation bilaterally HEART:  RRR.  PMI not displaced or sustained,S1 and S2 within normal limits, no S3, no S4, no clicks, no rubs, no murmurs ABD:  Flat, positive bowel sounds normal in frequency in pitch, no bruits, no rebound, no guarding, no midline pulsatile mass, no hepatomegaly, no splenomegaly EXT:  2 plus pulses throughout, no edema, no cyanosis no clubbing SKIN:  No rashes no  nodules NEURO:  Cranial nerves II through XII grossly intact, motor grossly intact throughout PSYCH:  Cognitively intact, oriented to person place and time  EKG:   09/05/2021: EKG was not ordered. 04/30/2021: EKG was not ordered. 11/07/2020: Sinus rhythm.  Rate 65 bpm.  First-degree AV block.  LVH with secondary repolarization abnormality.  LAFB. 05/03/20: Sinus rhythm. Rate 65 bpm.  05/10/18: Sinus rhythm.  Rate 80 bpm.  Mind inferior, anterior and lateral ST elevation.  PVC  Echo 05/03/2021:  1. Left ventricular ejection fraction, by estimation, is 55%. The left  ventricle has normal function. The left ventricle has no regional wall  motion abnormalities. Left ventricular diastolic parameters were normal.  The average left ventricular global  longitudinal strain is -15.6 %. The global longitudinal strain is normal.   2. Right ventricular systolic function is normal. The right ventricular  size is normal.   3. The mitral valve is abnormal. Trivial mitral valve regurgitation. No  evidence of mitral stenosis.   4. The aortic valve is tricuspid. There is mild calcification of the  aortic valve. Aortic valve regurgitation is not visualized. Mild aortic  valve sclerosis is present, with no evidence of aortic valve stenosis.   5. The inferior vena cava is normal in size with greater than 50%  respiratory variability, suggesting right atrial pressure of 3 mmHg.  CT Calcium Score 05/18/2020: Aorta: Normal size. Ascending aorta 3.0 cm. No calcifications. No dissection.   Aortic Valve:  Trileaflet.  No calcifications.   Coronary Arteries:  Normal coronary origin.  Right dominance.   RCA is a large dominant artery that gives rise to PDA and PLVB. There is no plaque.   Left main is a large artery that gives rise to LAD and LCX arteries.   LAD is a large vessel that has no plaque.   LCX is a non-dominant artery that gives rise to two large OM branches. There is no plaque.   Other findings:    Normal pulmonary vein drainage into the left atrium.   Normal let atrial appendage without a thrombus.   Normal size of the  Cardiology Office Note  Date:  09/11/2021   ID:  Sherri Sear, DOB [birth year], MRN [id number]  PCP:  [name], MD  Cardiologist:   Skeet Latch, MD   No chief complaint on file.  History of Present Illness: Kristeen Lantz is a 51 y.o. female with pericarditis and hyperlipidemia who presents for follow up.  Ms. Elliston was admitted 04/2018 with chest pain.  She has a history of palpitations that she attributes to stress at work.  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  However, for the week before she went to the hospital she noted feeling more anxious.  She had no URI at the time.  She was seen in the emergency department where EKG was initially concerning for STEMI.  Troponin was elevated to 0.75 and ESR was 30.  She was thought to have myopericarditis and was started on ibuprofen and colchicine.  She was also given a prescription for oxycodone.  She followed up in clinic 9/12 and was doing better but still had some pleuritic chest pain.  She started to become more active but developed recurrent chest pain.  She had a repeat echocardiogram 05/14/2018 that revealed LVEF 60 to 65% with no pericardial effusion.  She did have a left pleural effusion that was unchanged from the hospital.  She followed up with her rheumatologist, Dr. Gavin Pound, 04/2018.  She was noted to have a mildly positive rheumatoid factor, ESR, and CRP.  She tried tapering her ibuprofen and had recurrent pain after decreasing to twice a day.  It was decided that she would resume both ibuprofen and colchicine for the holidays.    Ms. Dust was seen by Jory Sims, DNP on 05/2019.  She was doing well at that time.  She saw her rheumatologist and started back on colchicine, ibuprofen and nebivolol.  It has subsequently been discontinued.  Overall she has felt well physically.  She had an episode of chest tightness that occurred with stress.  She had a coronary CT-A  04/2020 that revealed no coronary calcium and no CAD.  Ms. Lauter was seen in the ED 03/2021 with chest pain like her prior pericarditis.  She felt tightness in her chest and it was worse laying down.  She had a limited bedside echocardiogram by the ED physician and was not noted to have a pericardial effusion.  Cardiac enzymes were negative.  Her symptoms improved with Toradol.  She was started back on colchicine. By the time she followed up in clinic the next month she was feeling better. She had a full Echo 04/2021 that was unremarkable and had no pericardial effusion.   Today, she is feeling good overall. Since 05/2021 her chest discomfort has resolved, she has more energy, and she has noticed improvement concerning her back issues. She attributes these improvements to no longer eating meat, and following a plant-based diet. She has lost some weight, but is not formally exercising. Now that she has improved her diet, she plans on working on this more and increasing her formal exercise. She has not needed to use her colchicine or ibuprofen, but still has them to take if needed. Of note, she states it was not recommended that she receive a COVID booster. However, she has received her flu and shingles vaccinations. She denies any palpitations, chest pain, or shortness of breath. No lightheadedness, headaches, syncope, orthopnea, PND, lower extremity edema or exertional symptoms.   Past Medical History:  lb (84.8 kg)  04/30/21 192 lb 3.2 oz (87.2 kg)  04/11/21 189 lb (85.7 kg)      ASSESSMENT AND PLAN:  Acute pericarditis Resolved.  She is doing well and has no symptoms.  She attributes her improvement to a plant-based diet.  Pure hypercholesterolemia Lipids are elevated.  She had no coronary calcium.  She will  keep working on diet and start to increase her exercise.  Goal is >150 minutes weekly.     Current medicines are reviewed at length with the patient today.  The patient does not have concerns regarding medicines.  The following changes have been made:  none  Labs/ tests ordered today include:   No orders of the defined types were placed in this encounter.   Disposition:   FU with Sire Poet C. Duke Salvia, MD, [facility] in 1 year.  I,Mathew Stumpf,acting as a Neurosurgeon for Chilton Si, MD.,have documented all relevant documentation on the behalf of Chilton Si, MD,as directed by  Chilton Si, MD while in the presence of Chilton Si, MD.  I, Kensley Lares C. Duke Salvia, MD have reviewed all documentation for this visit.  The documentation of the exam, diagnosis, procedures, and orders on 09/11/2021 are all accurate and complete.   Signed, Sallyann Kinnaird C. Duke Salvia, MD, [facility]  09/11/2021 8:43 AM    [facility]

## 2021-09-05 ENCOUNTER — Encounter (HOSPITAL_BASED_OUTPATIENT_CLINIC_OR_DEPARTMENT_OTHER): Payer: Self-pay | Admitting: Cardiovascular Disease

## 2021-09-05 ENCOUNTER — Ambulatory Visit (INDEPENDENT_AMBULATORY_CARE_PROVIDER_SITE_OTHER): Payer: BC Managed Care – PPO | Admitting: Cardiovascular Disease

## 2021-09-05 ENCOUNTER — Other Ambulatory Visit: Payer: Self-pay

## 2021-09-05 DIAGNOSIS — E7800 Pure hypercholesterolemia, unspecified: Secondary | ICD-10-CM | POA: Diagnosis not present

## 2021-09-05 DIAGNOSIS — I300 Acute nonspecific idiopathic pericarditis: Secondary | ICD-10-CM | POA: Diagnosis not present

## 2021-09-05 HISTORY — DX: Pure hypercholesterolemia, unspecified: E78.00

## 2021-09-05 NOTE — Patient Instructions (Signed)
Medication Instructions:  ?Your physician recommends that you continue on your current medications as directed. Please refer to the Current Medication list given to you today.  ? ?*If you need a refill on your cardiac medications before your next appointment, please call your pharmacy* ? ?Lab Work: ?NONE ? ?Testing/Procedures: ?NONE ? ?Follow-Up: ?At [facility], you and your health needs are our priority.  As part of our continuing mission to provide you with exceptional heart care, we have created designated Provider Care Teams.  These Care Teams include your primary Cardiologist (physician) and Advanced Practice Providers (APPs -  Physician Assistants and Nurse Practitioners) who all work together to provide you with the care you need, when you need it. ? ?We recommend signing up for the patient portal called "MyChart".  Sign up information is provided on this After Visit Summary.  MyChart is used to connect with patients for Virtual Visits (Telemedicine).  Patients are able to view lab/test results, encounter notes, upcoming appointments, etc.  Non-urgent messages can be sent to your provider as well.   ?To learn more about what you can do with MyChart, go to [email or web address].   ? ?Your next appointment:   ?12 month(s) ? ?The format for your next appointment:   ?In Person ? ?Provider:   ?Tiffany Burton, MD  ? ? ? ? ? ? ?

## 2021-09-05 NOTE — Assessment & Plan Note (Signed)
Lipids are elevated.  She had no coronary calcium.  She will keep working on diet and start to increase her exercise.  Goal is >150 minutes weekly.

## 2021-09-05 NOTE — Assessment & Plan Note (Signed)
Resolved.  She is doing well and has no symptoms.  She attributes her improvement to a plant-based diet.

## 2021-09-11 ENCOUNTER — Encounter (HOSPITAL_BASED_OUTPATIENT_CLINIC_OR_DEPARTMENT_OTHER): Payer: Self-pay | Admitting: Cardiovascular Disease

## 2022-01-02 ENCOUNTER — Telehealth (HOSPITAL_BASED_OUTPATIENT_CLINIC_OR_DEPARTMENT_OTHER): Payer: Self-pay | Admitting: *Deleted

## 2022-01-02 NOTE — Telephone Encounter (Signed)
? ?  Primary Cardiologist: Skeet Latch, MD ? ?Chart reviewed as part of pre-operative protocol coverage. Given past medical history and time since last visit, based on ACC/AHA guidelines, [name] would be at acceptable risk for the planned procedure without further cardiovascular testing.  ? ? ?I will route this recommendation to the requesting party via Epic fax function and remove from pre-op pool. ? ?Please call with questions. ? ?Jossie Ng. Ruble Buttler NP-C ? ?  ?01/02/2022, 11:33 AM ?[place] ?[address] ?Office [phone] Fax [phone] ? ? ? ? ?

## 2022-01-02 NOTE — Telephone Encounter (Signed)
? ?  Pre-operative Risk Assessment  ?  ?Patient Name: [name]  ?DOB: 1970-12-08 ?MRN: [id number]  ? ?  ? ?Request for Surgical Clearance   ? ?Procedure:   Screen for colon Cancer ? ?Date of Surgery:  Clearance 01/29/22                              ?   ?Surgeon:  Unknown ?Surgeon's Group or Practice Name:  [facility] ?Phone number:  [phone] ?Fax number:  [phone] ?  ?Type of Clearance Requested:   ?- Medical  ?  ?Type of Anesthesia:   Propofol ?  ?Additional requests/questions:   ? ?Signed, ?Barbaraann Barthel   ?01/02/2022, 9:26 AM  ? ?

## 2022-01-13 ENCOUNTER — Emergency Department (HOSPITAL_BASED_OUTPATIENT_CLINIC_OR_DEPARTMENT_OTHER): Payer: BC Managed Care – PPO | Admitting: Radiology

## 2022-01-13 ENCOUNTER — Emergency Department (HOSPITAL_BASED_OUTPATIENT_CLINIC_OR_DEPARTMENT_OTHER): Payer: BC Managed Care – PPO

## 2022-01-13 ENCOUNTER — Encounter (HOSPITAL_BASED_OUTPATIENT_CLINIC_OR_DEPARTMENT_OTHER): Payer: Self-pay

## 2022-01-13 ENCOUNTER — Emergency Department (HOSPITAL_BASED_OUTPATIENT_CLINIC_OR_DEPARTMENT_OTHER)
Admission: EM | Admit: 2022-01-13 | Discharge: 2022-01-13 | Disposition: A | Discharge status: Home / Self Care | Payer: BC Managed Care – PPO | Attending: Emergency Medicine | Admitting: Emergency Medicine

## 2022-01-13 ENCOUNTER — Other Ambulatory Visit: Payer: Self-pay

## 2022-01-13 DIAGNOSIS — M5412 Radiculopathy, cervical region: Secondary | ICD-10-CM | POA: Insufficient documentation

## 2022-01-13 DIAGNOSIS — M79622 Pain in left upper arm: Secondary | ICD-10-CM | POA: Diagnosis present

## 2022-01-13 DIAGNOSIS — M79602 Pain in left arm: Secondary | ICD-10-CM

## 2022-01-13 LAB — BASIC METABOLIC PANEL
Anion gap: 12 (ref 5–15)
BUN: 25 mg/dL — ABNORMAL HIGH (ref 6–20)
CO2: 25 mmol/L (ref 22–32)
Calcium: 9.2 mg/dL (ref 8.9–10.3)
Chloride: 105 mmol/L (ref 98–111)
Creatinine, Ser: 0.72 mg/dL (ref 0.44–1.00)
GFR, Estimated: >60 mL/min (ref >60)
Glucose, Bld: 93 mg/dL (ref 70–99)
Potassium: 3.5 mmol/L (ref 3.5–5.1)
Sodium: 142 mmol/L (ref 135–145)

## 2022-01-13 LAB — CBC WITH DIFFERENTIAL/PLATELET
Abs Immature Granulocytes: 0.03 10*3/uL (ref 0.00–0.07)
Basophils Absolute: 0.1 10*3/uL (ref 0.0–0.1)
Basophils Relative: 1 %
Eosinophils Absolute: 0.2 10*3/uL (ref 0.0–0.5)
Eosinophils Relative: 2 %
HCT: 39 % (ref 36.0–46.0)
Hemoglobin: 12.8 g/dL (ref 12.0–15.0)
Immature Granulocytes: 0 %
Lymphocytes Relative: 34 %
Lymphs Abs: 3.2 10*3/uL (ref 0.7–4.0)
MCH: 30.5 pg (ref 26.0–34.0)
MCHC: 32.8 g/dL (ref 30.0–36.0)
MCV: 93.1 fL (ref 80.0–100.0)
Monocytes Absolute: 0.7 10*3/uL (ref 0.1–1.0)
Monocytes Relative: 7 %
Neutro Abs: 5.1 10*3/uL (ref 1.7–7.7)
Neutrophils Relative %: 56 %
Platelets: 258 10*3/uL (ref 150–400)
RBC: 4.19 MIL/uL (ref 3.87–5.11)
RDW: 13.4 % (ref 11.5–15.5)
WBC: 9.2 10*3/uL (ref 4.0–10.5)
nRBC: 0 % (ref 0.0–0.2)

## 2022-01-13 MED ORDER — PREDNISONE 50 MG PO TABS
60.0000 mg | ORAL_TABLET | Freq: Once | ORAL | Status: AC
  Administered 2022-01-13: 60 mg via ORAL
  Filled 2022-01-13: qty 1

## 2022-01-13 MED ORDER — KETOROLAC TROMETHAMINE 60 MG/2ML IM SOLN
30.0000 mg | Freq: Once | INTRAMUSCULAR | Status: AC
  Administered 2022-01-13: 30 mg via INTRAMUSCULAR
  Filled 2022-01-13: qty 2

## 2022-01-13 MED ORDER — OXYCODONE-ACETAMINOPHEN 5-325 MG PO TABS: 1.0000 | ORAL_TABLET | Freq: Four times a day (QID) | ORAL | 0 refills | Status: DC | PRN

## 2022-01-13 MED ORDER — PREDNISONE 20 MG PO TABS: 40.0000 mg | ORAL_TABLET | Freq: Every day | ORAL | 0 refills | Status: AC

## 2022-01-13 NOTE — ED Triage Notes (Signed)
Patient here POV from Home.  Endorses having localized Pain to Right Lateral Upper Arm. Noted Pain to Location 2 Weeks PTA and began to have Redness and Swelling to Same today.  No Known Fevers. Painful to Move Arm.  NAD noted during Triage. A&Ox4. GCS 15. Ambulatory.

## 2022-01-13 NOTE — ED Provider Notes (Signed)
[facility] EMERGENC[facility] DEPT Provider Note   CSN: [id number] Arrival date & time: 01/13/22  1159     History  Chief Complaint  Patient presents with   Arm Pain    [name] is a 51 y.o. female with PMHx pericarditis who presents to the ED today with complaint of gradual onset, constant, worsening, LUE pain that began approximately 2 weeks ago. Pt denies any trauma to the area. She complains of pain/swelling. She reports that she has had some weakness with lifting her arm s/2 pain. She woke up this morning and noticed redness to the upper arm and reports that a small area of her birth mark (located on the upper arm) was bleeding. She denies any chest pain or SOB. She does endorse a mild tingling sensation to her fingertips as well. No other complaints at this time.   The history is provided by the patient and medical records.      Home Medications Prior to Admission medications   Medication Sig Start Date End Date Taking? Authorizing Provider  oxyCODONE-acetaminophen (PERCOCET/ROXICET) 5-325 MG tablet Take 1 tablet by mouth every 6 (six) hours as needed for severe pain. 01/13/22  Yes Makalynn Berwanger, PA-C  predniSONE (DELTASONE) 20 MG tablet Take 2 tablets (40 mg total) by mouth daily for 5 days. 01/13/22 01/18/22 Yes Broadus Costilla, PA-C  Cholecalciferol (VITAMIN D3) 50 MCG (2000 UT) TABS Take 4,000 Int'l Units by mouth daily. 01/01/21   Laqueta Linden, MD  Coenzyme Q10 (COQ10) 50 MG CAPS See admin instructions.    [provider]  colchicine 0.6 MG tablet Take 1 tablet (0.6 mg total) by mouth 2 (two) times daily. Patient taking differently: Take 0.6 mg by mouth as needed. 03/31/21   Sherwood Gambler, MD  COLLAGEN PO Take 6,000 mg by mouth daily.    [provider]  Cyanocobalamin (VITAMIN B-12 PO) Take 5,000 mcg by mouth daily.    [provider]  EPINEPHrine 0.3 mg/0.3 mL IJ SOAJ injection Inject into the muscle. 04/11/21    [provider]  ibuprofen (ADVIL) 800 MG tablet Take 1 tablet (800 mg total) by mouth 3 (three) times daily. 03/31/21   Sherwood Gambler, MD  Multiple Vitamins-Minerals (CENTRUM SILVER 50+WOMEN) TABS Take 1 tablet by mouth daily.    [provider]  NUTRITIONAL SUPPLEMENTS PO Take by mouth. BIO-X4    [provider]  TURMERIC PO Take 2,000 mg by mouth.    [provider]      Allergies    Iodinated contrast media, Iodine, Other, and Shellfish allergy    Review of Systems   Review of Systems  Constitutional:  Negative for chills and fever.  Respiratory:  Negative for shortness of breath.   Cardiovascular:  Negative for chest pain.  Musculoskeletal:  Positive for arthralgias and joint swelling.  Skin:  Positive for color change.  All other systems reviewed and are negative.  Physical Exam Updated Vital Signs BP 130/86   Pulse 80   Temp 97.8 F (36.6 C)   Resp 15   Ht 5' 6.5" (1.689 m)   Wt 84.8 kg   LMP 09/11/2015   SpO2 99%   BMI 29.72 kg/m  Physical Exam Vitals and nursing note reviewed.  Constitutional:      Appearance: She is not ill-appearing.  HENT:     Head: Normocephalic and atraumatic.  Eyes:     Conjunctiva/sclera: Conjunctivae normal.  Cardiovascular:     Rate and Rhythm: Normal rate  and regular rhythm.     Pulses: Normal pulses.  Pulmonary:     Effort: Pulmonary effort is normal.     Breath sounds: Normal breath sounds.  Abdominal:     Palpations: Abdomen is soft.     Tenderness: There is no abdominal tenderness.  Musculoskeletal:     Cervical back: Neck supple.     Comments: Area of swelling noted to L upper arm with associated TTP and mild erythema. No increased warmth to the touch. ROM limited at shoulder s/2 pain. Able to flex and extend at the elbow without difficulty. Strength 5/5 to grip, wrist flexion/extension, and elbow flexion/extension.   Skin:    General: Skin is warm and dry.  Neurological:     Mental  Status: She is alert.    ED Results / Procedures / Treatments   Labs (all labs ordered are listed, but only abnormal results are displayed) Labs Reviewed  BASIC METABOLIC PANEL - Abnormal; Notable for the following components:      Result Value   BUN 25 (*)    All other components within normal limits  CBC WITH DIFFERENTIAL/PLATELET    EKG EKG Interpretation  Date/Time:  Monday Jan 13 2022 13:16:52 EDT Ventricular Rate:  67 PR Interval:  133 QRS Duration: 99 QT Interval:  394 QTC Calculation: 416 R Axis:   58 Text Interpretation: Sinus rhythm RSR' in V1 or V2, probably normal variant ST elev, probable normal early repol pattern Confirmed by Thamas Jaegers ([id number]) on 01/13/2022 2:06:45 PM  Radiology US Venous Img Upper Uni Left  Result Date: 01/13/2022 CLINICAL DATA:  Pain and swelling x2 weeks EXAM: Left UPPER EXTREMITY VENOUS DOPPLER ULTRASOUND TECHNIQUE: Gray-scale sonography with graded compression, as well as color Doppler and duplex ultrasound were performed to evaluate the upper extremity deep venous system from the level of the subclavian vein and including the jugular, axillary, basilic, radial, ulnar and upper cephalic vein. Spectral Doppler was utilized to evaluate flow at rest and with distal augmentation maneuvers. COMPARISON:  None Available. FINDINGS: Contralateral Subclavian Vein: Respiratory phasicity is normal and symmetric with the symptomatic side. No evidence of thrombus. Normal compressibility. Internal Jugular Vein: No evidence of thrombus. Normal compressibility, respiratory phasicity and response to augmentation. Subclavian Vein: No evidence of thrombus. Normal compressibility, respiratory phasicity and response to augmentation. Axillary Vein: No evidence of thrombus. Normal compressibility, respiratory phasicity and response to augmentation. Cephalic Vein: Not sonographically visualized. There is no evidence of any thrombus filled structure in the expected course of  cephalic vein. Basilic Vein: No evidence of thrombus. Normal compressibility, respiratory phasicity and response to augmentation. Brachial Veins: No evidence of thrombus. Normal compressibility, respiratory phasicity and response to augmentation. Radial Veins: No evidence of thrombus. Normal compressibility, respiratory phasicity and response to augmentation. Ulnar Veins: No evidence of thrombus. Normal compressibility, respiratory phasicity and response to augmentation. Venous Reflux:  None visualized. Other Findings:  None visualized. IMPRESSION: No evidence of DVT within the left upper extremity. Electronically Signed   By: Elmer Picker M.D.   On: 01/13/2022 13:55   DG Humerus Left  Result Date: 01/13/2022 CLINICAL DATA:  Shoulder pain EXAM: LEFT HUMERUS - 2+ VIEW COMPARISON:  None Available. FINDINGS: There is no evidence of fracture or other focal bone lesions. Soft tissues are unremarkable. IMPRESSION: Negative. Electronically Signed   By: Donavan Foil M.D.   On: 01/13/2022 15:06    Procedures Procedures    Medications Ordered in ED Medications  predniSONE (DELTASONE) tablet 60 mg (60  mg Oral Given 01/13/22 1438)  ketorolac (TORADOL) injection 30 mg (30 mg Intramuscular Given 01/13/22 1439)    ED Course/ Medical Decision Making/ A&P                           Medical Decision Making 51 year old female who presents to the ED today with complaint of atraumatic left upper arm pain/swelling that began 2 weeks ago, worsening in nature.  Noticed area of redness earlier today causing concern.  On arrival to the ED today vitals are unremarkable.  Patient is noted to have an area of swelling and tenderness palpation along the left upper arm with associated erythema without increased warmth.  Remainder of upper extremity does not appear swollen.  She has 2+ radial pulse.  She appears to have limited range of motion at the shoulder joint secondary to pain.  She has full range of motion at the  elbow, wrist, with grip strength.  She does mention she had some neck pain earlier in the week however none currently.  Also noted to have some tingling in her fingertips however is neurovascularly intact and strength and sensation intact besides at the shoulder.  Given swelling and arm pain we will plan for DVT rule out at this time.  We will plan for labs.  May consider imaging additionally including x-rays.  We will plan for pain medication including Toradol and prednisone.  Question radicular type pain secondary to neck pain with tingling in her fingers. Likely will need outpatient follow up with ortho.   DVT study negative. Will obtain xray for further eval at this time.  CBC without leukocytosis. Hgb stable without signs of anemia BMP without electrolyte abnormalities  Xray negative. Pt provided with toradol and prednisone. She reports mild improvement. Will discharge home with prednisone at this time and short course of pain meds. Pt advised to follow up with PCP or ortho for further eval. States she has followed with Ralie[facility] I[facility] in the past. Recommend repeat ultrasound in 10 days time if no improvement in symptoms to definitively rule out DVT. Pt is in agreement with plan and stable for discharge home.    Problems Addressed: Cervical radiculopathy: acute illness or injury Pain of left upper extremity: acute illness or injury  Amount and/or Complexity of Data Reviewed Labs: ordered. Decision-making details documented in ED Course. Radiology: ordered.  Risk Prescription drug management.          Final Clinical Impression(s) / ED Diagnoses Final diagnoses:  Pain of left upper extremity  Cervical radiculopathy    Rx / DC Orders ED Discharge Orders          Ordered    predniSONE (DELTASONE) 20 MG tablet  Daily        01/13/22 1527    oxyCODONE-acetaminophen (PERCOCET/ROXICET) 5-325 MG tablet  Every 6 hours PRN        01/13/22 1527             Discharge  Instructions      Please pick up medications and take as prescribed. Do not take NSAIDs while on the prednisone as it could cause GI upset/bleeding.   Follow up with your PCP or orthopedist for further evaluation of your pain. You may need a repeat ultrasound in 10 days time if symptoms persist.   Return to the ED for any new/worsening symptoms       Eustaquio Maize, PA-C 01/13/22 [place],  MD 01/17/22 2800

## 2022-01-13 NOTE — Discharge Instructions (Signed)
Please pick up medications and take as prescribed. Do not take NSAIDs while on the prednisone as it could cause GI upset/bleeding.   Follow up with your PCP or orthopedist for further evaluation of your pain. You may need a repeat ultrasound in 10 days time if symptoms persist.   Return to the ED for any new/worsening symptoms

## 2022-02-04 ENCOUNTER — Encounter: Payer: Self-pay | Admitting: *Deleted

## 2022-02-06 ENCOUNTER — Ambulatory Visit: Payer: BC Managed Care – PPO | Admitting: Diagnostic Neuroimaging

## 2022-02-06 ENCOUNTER — Encounter: Payer: Self-pay | Admitting: Diagnostic Neuroimaging

## 2022-02-06 VITALS — BP 129/79 | HR 74 | Ht 66.0 in | Wt 186.0 lb

## 2022-02-06 DIAGNOSIS — R29898 Other symptoms and signs involving the musculoskeletal system: Secondary | ICD-10-CM | POA: Diagnosis not present

## 2022-02-06 DIAGNOSIS — M25512 Pain in left shoulder: Secondary | ICD-10-CM | POA: Diagnosis not present

## 2022-02-06 DIAGNOSIS — M79602 Pain in left arm: Secondary | ICD-10-CM

## 2022-02-06 DIAGNOSIS — G8929 Other chronic pain: Secondary | ICD-10-CM | POA: Diagnosis not present

## 2022-02-06 NOTE — Patient Instructions (Signed)
LEFT SHOULDER PAIN AND WEAKNESS (left deltoid and left infraspinatus weakness; likely rotator cuff tendonitis or other musculoskeletal / autoimmune issue; has positive rheumatoid factor)  - follow up MRI shoulder and orthopedic follow up; then PT/OT eval  - follow up MRI brain / cervical spine (ordered by PCP to rule out MS)  - consider rheumatology follow up

## 2022-02-06 NOTE — Progress Notes (Signed)
[facility]  PATIENT: [name] DOB: May 07, 1971  REFERRING CLINICIAN: Lawerance Cruel, MD HISTORY FROM: patient  REASON FOR VISIT: new consult    HISTORICAL  CHIEF COMPLAINT:  Chief Complaint  Patient presents with   Left arm weakness    Rm 6 New Pt  "2 months of weakness and numbness in left upper arm, tenderness and sharp pains"    HISTORY OF PRESENT ILLNESS:   51 year old female here for evaluation of left arm pain and weakness.  May 2023.  Patient woke up with gradual onset of left shoulder pain, swelling, redness and weakness.  Patient went to PCP and then orthopedic clinic for evaluation.  She had MRI of the shoulder, brain and cervical spine ordered, but not done yet.  Swelling and redness have reduced.  Pain and weakness are stable to slightly improved.  No problems with right arm or bilateral legs.  No face, vision, speech or swallowing problems.  Patient has positive rheumatoid factor, has been seen by rheumatology in the past, not on medication.   REVIEW OF SYSTEMS: Full 14 system review of systems performed and negative with exception of: as per HPI.  ALLERGIES: Allergies  Allergen Reactions   Iodinated Contrast Media Hives    And difficulty breathing   Iodine Anaphylaxis   Other Anaphylaxis   Shellfish Allergy Anaphylaxis    HOME MEDICATIONS: Outpatient Medications Prior to Visit  Medication Sig Dispense Refill   Cholecalciferol (VITAMIN D3) 50 MCG (2000 UT) TABS Take 4,000 Int'l Units by mouth daily. 30 tablet 0   Coenzyme Q10 (COQ10) 50 MG CAPS See admin instructions.     COLLAGEN PO Take 6,000 mg by mouth daily.     Cyanocobalamin (VITAMIN B-12 PO) Take 5,000 mcg by mouth daily.     EPINEPHrine 0.3 mg/0.3 mL IJ SOAJ injection Inject into the muscle.     Multiple Vitamins-Minerals (CENTRUM SILVER 50+WOMEN) TABS Take 1 tablet by mouth daily.     NUTRITIONAL SUPPLEMENTS PO Take by mouth. BIO-X4     TURMERIC PO  Take 2,000 mg by mouth.     colchicine 0.6 MG tablet Take 1 tablet (0.6 mg total) by mouth 2 (two) times daily. (Patient not taking: Reported on 02/06/2022) 30 tablet 1   ibuprofen (ADVIL) 800 MG tablet Take 1 tablet (800 mg total) by mouth 3 (three) times daily. (Patient not taking: Reported on 02/06/2022) 21 tablet 0   oxyCODONE-acetaminophen (PERCOCET/ROXICET) 5-325 MG tablet Take 1 tablet by mouth every 6 (six) hours as needed for severe pain. (Patient not taking: Reported on 02/06/2022) 10 tablet 0   No facility-administered medications prior to visit.    PAST MEDICAL HISTORY: Past Medical History:  Diagnosis Date   Acne    Anemia    Anxiety    Chest pain    Constipation    Depression    Elevated troponin 04/28/2018   Endometriosis    Food allergy    HELLP (hemolytic anemia/elev liver enzymes/low platelets in pregnancy)    hx of two pregnancies   Hypertension    Joint pain    Kidney problem    Lactose intolerance    Leg cramps    Low back pain    Miscarriage    Normal spontaneous vaginal delivery    Obesity    Obesity (BMI 30.0-34.9) 11/07/2020   Other fatigue    Palpitation    Pericarditis    Pure hypercholesterolemia 09/05/2021   Sleep apnea    SOB (  shortness of breath) on exertion    Thyroid dysfunction    Vitamin B 12 deficiency    Vitamin D deficiency     PAST SURGICAL HISTORY: Past Surgical History:  Procedure Laterality Date   ABDOMINOPLASTY     BILATERAL SALPINGECTOMY Right 10/16/2015   Procedure: RIGHT  SALPINGECTOMY;  Surgeon: Terrance Mass, MD;  Location: [facility];  Service: Gynecology;  Laterality: Right;   BREAST EXCISIONAL BIOPSY Left 2021   BUNIONECTOMY Right 11/30/2015   @ [facility]   BUNIONECTOMY Left 12/21/2015   @ [facility]   BUNIONECTOMY Left 03/07/2016   '@[facility]'$     CERVICAL ABLATION     CESAREAN SECTION     CYSTOSCOPY N/A 10/16/2015   Procedure: CYSTOSCOPY;  Surgeon: Terrance Mass, MD;  Location: [facility];  Service: Gynecology;  Laterality: N/A;    DILATION AND CURETTAGE OF UTERUS     LAPAROSCOPIC VAGINAL HYSTERECTOMY WITH SALPINGO OOPHORECTOMY Left 10/16/2015   Procedure: LAPAROSCOPIC ASSISTED VAGINAL HYSTERECTOMY WITH SALPINGO OOPHORECTOMY;  Surgeon: Terrance Mass, MD;  Location: [facility];  Service: Gynecology;  Laterality: Left;   TUBAL LIGATION      FAMILY HISTORY: Family History  Problem Relation Age of Onset   Diabetes Mother    Hypertension Mother    Thyroid disease Mother    Cancer Mother        THYROID   Hyperlipidemia Mother    Obesity Mother    Lymphoma Father    Cataracts Father    Heart failure Father    Cancer Father    Anxiety disorder Father    Diabetes Brother    Cancer Brother        lymphoma   Heart disease Maternal Grandfather    Heart failure Maternal Grandfather    Stroke Maternal Grandfather    Sickle cell anemia Maternal Aunt    Kidney failure Maternal Grandmother    Diabetes Maternal Grandmother     SOCIAL HISTORY: Social History   Socioeconomic History   Marital status: Married    Spouse name: Tyrone   Number of children: Not on file   Years of education: Not on file   Highest education level: Not on file  Occupational History   Occupation: Sales promotion account executive school principal  Tobacco Use   Smoking status: Never   Smokeless tobacco: Never  Vaping Use   Vaping Use: Never used  Substance and Sexual Activity   Alcohol use: Yes    Alcohol/week: 0.0 standard drinks of alcohol    Comment: WINE.... OCC   Drug use: No   Sexual activity: Yes    Partners: Male    Birth control/protection: Surgical    Comment: 1st intercourse- 36, partners- 6, married- 72 yrs   Other Topics Concern   Not on file  Social History Narrative   Lives with husband   Social Determinants of Health   Financial Resource Strain: Not on file  Food Insecurity: Not on file  Transportation Needs: Not on file  Physical Activity: Not on file  Stress: Not on file  Social Connections: Not on file  Intimate Partner  Violence: Not on file     PHYSICAL EXAM  GENERAL EXAM/CONSTITUTIONAL: Vitals:  Vitals:   02/06/22 1402  BP: 129/79  Pulse: 74  Weight: 186 lb (84.4 kg)  Height: '5\' 6"'$  (1.676 m)   Body mass index is 30.02 kg/m. Wt Readings from Last 3 Encounters:  02/06/22 186 lb (84.4 kg)  01/13/22 186 lb 15.2 oz (84.8 kg)  09/05/21 187 lb (  84.8 kg)   Patient is in no distress; well developed, nourished and groomed; neck is supple  CARDIOVASCULAR: Examination of carotid arteries is normal; no carotid bruits Regular rate and rhythm, no murmurs Examination of peripheral vascular system by observation and palpation is normal  EYES: Ophthalmoscopic exam of optic discs and posterior segments is normal; no papilledema or hemorrhages No results found.  MUSCULOSKELETAL: Gait, strength, tone, movements noted in Neurologic exam below  NEUROLOGIC: MENTAL STATUS:      No data to display         awake, alert, oriented to person, place and time recent and remote memory intact normal attention and concentration language fluent, comprehension intact, naming intact fund of knowledge appropriate  CRANIAL NERVE:  2nd - no papilledema on fundoscopic exam 2nd, 3rd, 4th, 6th - pupils equal and reactive to light, visual fields full to confrontation, extraocular muscles intact, no nystagmus 5th - facial sensation symmetric 7th - facial strength symmetric 8th - hearing intact 9th - palate elevates symmetrically, uvula midline 11th - shoulder shrug symmetric 12th - tongue protrusion midline  MOTOR:  normal bulk and tone, full strength in the BUE, BLE; EXCEPT LEFT DELTOID 2-3, LEFT ARM EXTERNAL ROTATION 3  SENSORY:  normal and symmetric to light touch, temperature, vibration  COORDINATION:  finger-nose-finger, fine finger movements normal  REFLEXES:  deep tendon reflexes present and symmetric  GAIT/STATION:  narrow based gait; able to walk on toes, heels and tandem; romberg is  negative     DIAGNOSTIC DATA (LABS, IMAGING, TESTING) - I reviewed patient records, labs, notes, testing and imaging myself where available.  Lab Results  Component Value Date   WBC 9.2 01/13/2022   HGB 12.8 01/13/2022   HCT 39.0 01/13/2022   MCV 93.1 01/13/2022   PLT 258 01/13/2022      Component Value Date/Time   NA 142 01/13/2022 1316   NA 142 12/18/2020 1123   K 3.5 01/13/2022 1316   CL 105 01/13/2022 1316   CO2 25 01/13/2022 1316   GLUCOSE 93 01/13/2022 1316   BUN 25 (H) 01/13/2022 1316   BUN 14 12/18/2020 1123   CREATININE 0.72 01/13/2022 1316   CREATININE 0.84 11/26/2017 0843   CALCIUM 9.2 01/13/2022 1316   PROT 7.2 12/18/2020 1123   ALBUMIN 4.4 12/18/2020 1123   AST 21 12/18/2020 1123   ALT 24 12/18/2020 1123   ALKPHOS 81 12/18/2020 1123   BILITOT 0.4 12/18/2020 1123   GFRNONAA >60 01/13/2022 1316   GFRAA 99 05/17/2020 1527   Lab Results  Component Value Date   CHOL 220 (H) 12/18/2020   HDL 71 12/18/2020   LDLCALC 138 (H) 12/18/2020   TRIG 65 12/18/2020   CHOLHDL 3.3 05/17/2020   Lab Results  Component Value Date   HGBA1C 5.6 12/18/2020   Lab Results  Component Value Date   VITAMINB12 848 12/18/2020   Lab Results  Component Value Date   TSH 0.586 02/13/2021       ASSESSMENT AND PLAN  51 y.o. year old female here with:   Dx:  1. Left arm pain   2. Chronic left shoulder pain   3. Left arm weakness      PLAN:  LEFT SHOULDER PAIN AND WEAKNESS (left deltoid and left infraspinatus weakness; likely rotator cuff tendonitis or other musculoskeletal / autoimmune issue; has positive rheumatoid factor) - follow up MRI shoulder and orthopedic follow up; then PT/OT eval - follow up MRI brain / cervical spine (ordered by PCP to rule  out MS) - consider rheumatology follow up - may consider EMG/NCS in future  Return for pending if symptoms worsen or fail to improve, pending test results.    Penni Bombard, MD 10/03/9066, 9:34  PM Certified in Neurology, Neurophysiology and Neuroimaging  [facility] [address] [place], [place] [address] [phone]

## 2022-04-02 ENCOUNTER — Encounter (INDEPENDENT_AMBULATORY_CARE_PROVIDER_SITE_OTHER): Payer: Self-pay

## 2022-04-16 ENCOUNTER — Encounter: Payer: Self-pay | Admitting: Obstetrics & Gynecology

## 2022-04-16 ENCOUNTER — Other Ambulatory Visit (HOSPITAL_COMMUNITY)
Admission: RE | Admit: 2022-04-16 | Discharge: 2022-04-16 | Disposition: A | Discharge status: Home / Self Care | Payer: BC Managed Care – PPO | Source: Ambulatory Visit | Attending: Obstetrics & Gynecology | Admitting: Obstetrics & Gynecology

## 2022-04-16 ENCOUNTER — Ambulatory Visit (INDEPENDENT_AMBULATORY_CARE_PROVIDER_SITE_OTHER): Payer: BC Managed Care – PPO | Admitting: Obstetrics & Gynecology

## 2022-04-16 VITALS — BP 110/74 | HR 60 | Ht 66.25 in | Wt 190.0 lb

## 2022-04-16 DIAGNOSIS — Z1272 Encounter for screening for malignant neoplasm of vagina: Secondary | ICD-10-CM | POA: Diagnosis present

## 2022-04-16 DIAGNOSIS — Z01419 Encounter for gynecological examination (general) (routine) without abnormal findings: Secondary | ICD-10-CM

## 2022-04-16 DIAGNOSIS — N951 Menopausal and female climacteric states: Secondary | ICD-10-CM

## 2022-04-16 DIAGNOSIS — R4189 Other symptoms and signs involving cognitive functions and awareness: Secondary | ICD-10-CM | POA: Diagnosis not present

## 2022-04-16 DIAGNOSIS — Z90710 Acquired absence of both cervix and uterus: Secondary | ICD-10-CM

## 2022-04-16 DIAGNOSIS — N898 Other specified noninflammatory disorders of vagina: Secondary | ICD-10-CM

## 2022-04-16 LAB — WET PREP FOR TRICH, YEAST, CLUE

## 2022-04-16 NOTE — Progress Notes (Signed)
[name] 05-Mar-1971 [id number]   History:    51 y.o. 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   RP:  Established patient presenting for annual gyn exam    HPI: S/P LAVH.  Occasional hot flushes.  C/O worsening Brain fogs at times. Moments of memory loss, loosing track of what she was doing or saying.  Recent episode of numbness in the left arm, investigated for stroke.  Brain scan negative. No insomnia.  No pelvic pain.  Vaginal odor.  Starting to have dryness with intercourse. Pap Neg 11/2017.  Pap reflex today. Urine and bowel movements normal. Breasts normal.  Mammo Neg 03/2021, will schedule.  Body mass index 30.44.  Would like to loose weight.  Exercising on the treadmill.  Health labs with family physician.  [place] 2023.  Past medical history,surgical history, family history and social history were all reviewed and documented in the EPIC chart.  Gynecologic History Patient's last menstrual period was 09/11/2015.  Obstetric History OB History  Gravida Para Term Preterm AB Living  '2 2 1 1   2  '$ SAB IAB Ectopic Multiple Live Births          2    # Outcome Date GA Lbr Len/2nd Weight Sex Delivery Anes PTL Lv  2 Preterm     F CS-Unspec  Y LIV  1 Term     M CS-Unspec  N LIV     ROS: A ROS was performed and pertinent positives and negatives are included in the history. GENERAL: No fevers or chills. HEENT: No change in vision, no earache, sore throat or sinus congestion. NECK: No pain or stiffness. CARDIOVASCULAR: No chest pain or pressure. No palpitations. PULMONARY: No shortness of breath, cough or wheeze. GASTROINTESTINAL: No abdominal pain, nausea, vomiting or diarrhea, melena or bright red blood per rectum. GENITOURINARY: No urinary frequency, urgency, hesitancy or dysuria. MUSCULOSKELETAL: No joint or muscle pain, no back pain, no recent trauma. DERMATOLOGIC: No rash, no itching, no lesions. ENDOCRINE: No polyuria, polydipsia, no heat or  cold intolerance. No recent change in weight. HEMATOLOGICAL: No anemia or easy bruising or bleeding. NEUROLOGIC: No headache, seizures, numbness, tingling or weakness. PSYCHIATRIC: No depression, no loss of interest in normal activity or change in sleep pattern.     Exam:   BP 110/74   Pulse 60   Ht 5' 6.25" (1.683 m)   Wt 190 lb (86.2 kg)   LMP 09/11/2015   SpO2 96%   BMI 30.44 kg/m   Body mass index is 30.44 kg/m.  General appearance : Well developed well nourished female. No acute distress HEENT: Eyes: no retinal hemorrhage or exudates,  Neck supple, trachea midline, no carotid bruits, no thyroidmegaly Lungs: Clear to auscultation, no rhonchi or wheezes, or rib retractions  Heart: Regular rate and rhythm, no murmurs or gallops Breast:Examined in sitting and supine position were symmetrical in appearance, no palpable masses or tenderness,  no skin retraction, no nipple inversion, no nipple discharge, no skin discoloration, no axillary or supraclavicular lymphadenopathy Abdomen: no palpable masses or tenderness, no rebound or guarding Extremities: no edema or skin discoloration or tenderness  Pelvic: Vulva: Normal             Vagina: No gross lesions or discharge.  Wet prep done.  Pap reflex done.  Cervix/Uterus absent  Adnexa  Without masses or tenderness  Anus: Normal  Wet prep Neg   Assessment/Plan:  51 y.o. female for annual  exam   1. Encounter for Papanicolaou smear of vagina as part of routine gynecological examination S/P LAVH.  Occasional hot flushes.  C/O worsening Brain fogs at times.  No insomnia.  No pelvic pain.  Vaginal odor.  Starting to have dryness with intercourse. Pap Neg 11/2017.  Pap reflex today. Urine and bowel movements normal. Breasts normal.  Mammo Neg 03/2021, will schedule.  Body mass index 30.44.  Would like to loose weight.  Exercising on the treadmill.  Health labs with family physician.  [place] 2023. - Cytology - PAP( [place])  2. History of  LAVH  3. Perimenopause S/P LAVH.  Occasional hot flushes.  C/O worsening Brain fogs at times.  No insomnia.  No pelvic pain.  [place] today.  4. Brain fog Progressive, but intermittent.  Moments of memory loss, loosing track of what she was doing or saying.  Recent episode of numbness in the left arm, investigated for stroke.  Brain scan negative.  Decision to refer to Neurology to further investigate. - [place]  5. Vaginal odor - WET PREP FOR TRICH, YEAST, CLUE   Princess Bruins MD, 4:10 PM 04/16/2022

## 2022-04-17 ENCOUNTER — Telehealth: Payer: Self-pay

## 2022-04-17 DIAGNOSIS — R413 Other amnesia: Secondary | ICD-10-CM

## 2022-04-17 LAB — FOLLICLE STIMULATING HORMONE: FSH: 104.7 m[IU]/mL

## 2022-04-17 NOTE — Telephone Encounter (Signed)
Refer to Neurology Received: [name], [place] Triage Refer to Neurology.  Progressive, but intermittent.  Moments of memory loss, loosing track of what she was doing or saying.  Recent episode of numbness in the left arm, investigated for stroke.  Brain scan negative.  Decision to refer to Neurology to further investigate.

## 2022-04-17 NOTE — Telephone Encounter (Signed)
[facility] messaged back that patient is established with [facility]. I called patient to inquire and she said she could not remember the name. I asked her can I place request for that office to call her to schedule and she agreed.

## 2022-04-17 NOTE — Telephone Encounter (Signed)
Referral placed in Epic with [facility].

## 2022-04-21 LAB — CYTOLOGY - PAP: Diagnosis: NEGATIVE

## 2022-04-30 NOTE — Telephone Encounter (Signed)
Patient is scheduled 06/05/22 at 10:45am.

## 2022-05-26 ENCOUNTER — Other Ambulatory Visit: Payer: Self-pay | Admitting: Internal Medicine

## 2022-05-26 DIAGNOSIS — Z1231 Encounter for screening mammogram for malignant neoplasm of breast: Secondary | ICD-10-CM

## 2022-05-28 ENCOUNTER — Telehealth: Payer: Self-pay | Admitting: Diagnostic Neuroimaging

## 2022-05-28 NOTE — Telephone Encounter (Signed)
LVM and sent MyChart msg informing pt of r/s needed for 10/12 appt- MD out.

## 2022-06-05 ENCOUNTER — Institutional Professional Consult (permissible substitution): Payer: BC Managed Care – PPO | Admitting: Diagnostic Neuroimaging

## 2022-06-11 ENCOUNTER — Ambulatory Visit
Admission: RE | Admit: 2022-06-11 | Discharge: 2022-06-11 | Disposition: A | Discharge status: Home / Self Care | Payer: BC Managed Care – PPO | Source: Ambulatory Visit | Attending: Obstetrics & Gynecology | Admitting: Obstetrics & Gynecology

## 2022-06-11 DIAGNOSIS — Z1231 Encounter for screening mammogram for malignant neoplasm of breast: Secondary | ICD-10-CM

## 2022-11-12 ENCOUNTER — Other Ambulatory Visit: Payer: Self-pay | Admitting: Internal Medicine

## 2022-11-12 DIAGNOSIS — Z1231 Encounter for screening mammogram for malignant neoplasm of breast: Secondary | ICD-10-CM

## 2023-07-21 ENCOUNTER — Telehealth (HOSPITAL_BASED_OUTPATIENT_CLINIC_OR_DEPARTMENT_OTHER): Payer: Self-pay | Admitting: Cardiovascular Disease

## 2023-07-21 ENCOUNTER — Ambulatory Visit: Payer: BC Managed Care – PPO | Attending: Cardiology | Admitting: Cardiology

## 2023-07-21 ENCOUNTER — Other Ambulatory Visit: Payer: Self-pay | Admitting: Internal Medicine

## 2023-07-21 ENCOUNTER — Encounter: Payer: Self-pay | Admitting: Cardiology

## 2023-07-21 ENCOUNTER — Ambulatory Visit
Admission: RE | Admit: 2023-07-21 | Discharge: 2023-07-21 | Disposition: A | Discharge status: Home / Self Care | Payer: BC Managed Care – PPO | Source: Ambulatory Visit | Attending: Internal Medicine | Admitting: Internal Medicine

## 2023-07-21 VITALS — BP 126/82 | HR 66 | Ht 66.5 in | Wt 194.6 lb

## 2023-07-21 DIAGNOSIS — R079 Chest pain, unspecified: Secondary | ICD-10-CM | POA: Diagnosis not present

## 2023-07-21 DIAGNOSIS — I10 Essential (primary) hypertension: Secondary | ICD-10-CM | POA: Diagnosis not present

## 2023-07-21 DIAGNOSIS — I319 Disease of pericardium, unspecified: Secondary | ICD-10-CM | POA: Diagnosis not present

## 2023-07-21 DIAGNOSIS — R0789 Other chest pain: Secondary | ICD-10-CM

## 2023-07-21 DIAGNOSIS — I300 Acute nonspecific idiopathic pericarditis: Secondary | ICD-10-CM | POA: Diagnosis not present

## 2023-07-21 MED ORDER — IBUPROFEN 200 MG PO TABS: ORAL_TABLET | ORAL | 0 refills | Status: AC

## 2023-07-21 MED ORDER — PANTOPRAZOLE SODIUM 40 MG PO TBEC: DELAYED_RELEASE_TABLET | ORAL | 0 refills | Status: DC

## 2023-07-21 MED ORDER — IBUPROFEN 800 MG PO TABS: 800.0000 mg | ORAL_TABLET | Freq: Three times a day (TID) | ORAL | 1 refills | Status: DC

## 2023-07-21 NOTE — Telephone Encounter (Signed)
Do you want to Rx the 800mg  tablet or have her just take 4 of the 200mg ?  Alver Sorrow, NP

## 2023-07-21 NOTE — Telephone Encounter (Signed)
Filled tablets as requested and sent 1 refill as an just incase per Reather Littler NP

## 2023-07-21 NOTE — Telephone Encounter (Signed)
Pt c/o medication issue:  1. Name of Medication:   ibuprofen (ADVIL) 200 MG tablet   2. How are you currently taking this medication (dosage and times per day)?   3. Are you having a reaction (difficulty breathing--STAT)?   4. What is your medication issue?   Patient stated she wants to get the 800 mg dosage of this medication as she is completely out.

## 2023-07-21 NOTE — Progress Notes (Signed)
Cardiology Office Note    Date:  07/21/2023  ID:  [name], DOB 06/11/1971, MRN [id number] PCP:  [place], MD  Cardiologist:  Chilton Si, MD  Electrophysiologist:  None   Chief Complaint: Chest pain   History of Present Illness: .    [name] is a 52 y.o. female with visit-pertinent history of pericarditis, palpitations and hyperlipidemia.  [name] was admitted in 04/2018 with chest pain.  In the ED her original EKG was current was initially concerning for STEMI.  Troponin was elevated at 0.75 and ESR was 30.  Was felt she had myopericarditis and was started on ibuprofen and colchicine.  On follow-up she reported she is doing better but still had some pleuritic chest pain, repeat echo on 05/14/2018 revealed LVEF 60 to 65% with no pericardial effusion.  She did have a left pleural effusion that was unchanged from hospital.  She followed up with her rheumatologist, Dr. Garen Grams.  She was noted to have a mildly positive rheumatoid factor, ESR and CRP.  She had a coronary CTA in 04/2020 that revealed no coronary calcium and no CAD.  She returned to the ED in 03/2021 with chest pain similar to her prior pericarditis, cardiac enzymes were negative, her symptoms improved Toradol.  She was started back on colchicine.  She was last seen in clinic on 09/05/2021 by Dr. Duke Salvia.  At that time her chest discomfort had resolved, she had more energy and she noticed improvement concerning her back issues.  Today she presents for an acute visit reporting increased chest pressure that started two weeks ago.  She notes this is the same pain as her prior pericarditis pain, this worsens when she lays down, leaning forward and when taking a deep breath.  She reports that sitting up improves her pain.  She notes that her pain is a 6 out of 10, she notes that last week she took some ibuprofen with improvement then towards the end of the week the pain became more  persistent.  Patient notes that she started on Adderall in October, questions if this is possibly related to pain given timeline.  ROS: .   Today denies lower extremity edema, fatigue, melena, hematuria, hemoptysis, diaphoresis, weakness, presyncope, syncope, orthopnea, and PND.  All other systems are reviewed and otherwise negative. Studies Reviewed: Marland Kitchen    EKG:  EKG is ordered today, personally reviewed, demonstrating  EKG Interpretation Date/Time:  Tuesday July 21 2023 11:54:21 EST Ventricular Rate:  66 PR Interval:  132 QRS Duration:  90 QT Interval:  380 QTC Calculation: 398 R Axis:   -3  Text Interpretation: Normal sinus rhythm Normal ECG Confirmed by Reather Littler [id number]) on 07/21/2023 12:06:04 PM   CV Studies:  Cardiac Studies & Procedures       ECHOCARDIOGRAM  ECHOCARDIOGRAM COMPLETE 05/03/2021  Narrative ECHOCARDIOGRAM REPORT    Patient Name:   [name] [facility] Date of Exam: 05/03/2021 Medical Rec #:  [id number]                    Height:       66.5 in Accession #:    [id number]                   Weight:       192.2 lb Date of Birth:  Mar 28, 1971                    BSA:  1.977 m Patient Age:    52 years                     BP:           118/78 mmHg Patient Gender: F                            HR:           68 bpm. Exam Location:  Outpatient  Procedure: 2D Echo, Color Doppler and Cardiac Doppler  Indications:    Pericarditis  History:        Patient has prior history of Echocardiogram examinations, most recent 07/01/2019. Previous Myocardial Infarction; Risk Factors:Non-Smoker. Acute Pericarditis.  Sonographer:    Jeryl Columbia RDCS Referring Phys: [id number] TIFFANY [place]  IMPRESSIONS   1. Left ventricular ejection fraction, by estimation, is 55%. The left ventricle has normal function. The left ventricle has no regional wall motion abnormalities. Left ventricular diastolic parameters were normal. The average left ventricular  global longitudinal strain is -15.6 %. The global longitudinal strain is normal. 2. Right ventricular systolic function is normal. The right ventricular size is normal. 3. The mitral valve is abnormal. Trivial mitral valve regurgitation. No evidence of mitral stenosis. 4. The aortic valve is tricuspid. There is mild calcification of the aortic valve. Aortic valve regurgitation is not visualized. Mild aortic valve sclerosis is present, with no evidence of aortic valve stenosis. 5. The inferior vena cava is normal in size with greater than 50% respiratory variability, suggesting right atrial pressure of 3 mmHg.  FINDINGS Left Ventricle: Left ventricular ejection fraction, by estimation, is 55%. The left ventricle has normal function. The left ventricle has no regional wall motion abnormalities. The average left ventricular global longitudinal strain is -15.6 %. The global longitudinal strain is normal. The left ventricular internal cavity size was normal in size. There is no left ventricular hypertrophy. Left ventricular diastolic parameters were normal.  Right Ventricle: The right ventricular size is normal. No increase in right ventricular wall thickness. Right ventricular systolic function is normal.  Left Atrium: Left atrial size was normal in size.  Right Atrium: Right atrial size was normal in size.  Pericardium: There is no evidence of pericardial effusion.  Mitral Valve: The mitral valve is abnormal. There is mild thickening of the mitral valve leaflet(s). Trivial mitral valve regurgitation. No evidence of mitral valve stenosis.  Tricuspid Valve: The tricuspid valve is normal in structure. Tricuspid valve regurgitation is trivial. No evidence of tricuspid stenosis.  Aortic Valve: The aortic valve is tricuspid. There is mild calcification of the aortic valve. Aortic valve regurgitation is not visualized. Mild aortic valve sclerosis is present, with no evidence of aortic valve  stenosis.  Pulmonic Valve: The pulmonic valve was normal in structure. Pulmonic valve regurgitation is not visualized. No evidence of pulmonic stenosis.  Aorta: The aortic root is normal in size and structure.  Venous: The inferior vena cava is normal in size with greater than 50% respiratory variability, suggesting right atrial pressure of 3 mmHg.  IAS/Shunts: No atrial level shunt detected by color flow Doppler.   LEFT VENTRICLE PLAX 2D LVIDd:         4.74 cm     Diastology LVIDs:         3.11 cm     LV e' medial:    11.00 cm/s LV PW:         0.81 cm  LV E/e' medial:  8.4 LV IVS:        0.83 cm     LV e' lateral:   13.80 cm/s LVOT diam:     2.00 cm     LV E/e' lateral: 6.7 LV SV:         64 LV SV Index:   33          2D Longitudinal Strain LVOT Area:     3.14 cm    2D Strain GLS Avg:     -15.6 %  LV Volumes (MOD) LV vol d, MOD A2C: 85.1 ml LV vol d, MOD A4C: 94.0 ml LV vol s, MOD A2C: 39.0 ml LV vol s, MOD A4C: 41.0 ml LV SV MOD A2C:     46.1 ml LV SV MOD A4C:     94.0 ml LV SV MOD BP:      51.6 ml  RIGHT VENTRICLE RV Basal diam:  2.96 cm RV Mid diam:    2.14 cm RV S prime:     10.60 cm/s TAPSE (M-mode): 1.9 cm  LEFT ATRIUM             Index       RIGHT ATRIUM           Index LA diam:        3.20 cm 1.62 cm/m  RA Area:     10.90 cm LA Vol (A2C):   35.9 ml 18.16 ml/m RA Volume:   24.20 ml  12.24 ml/m LA Vol (A4C):   40.2 ml 20.33 ml/m LA Biplane Vol: 39.4 ml 19.93 ml/m AORTIC VALVE LVOT Vmax:   107.00 cm/s LVOT Vmean:  65.000 cm/s LVOT VTI:    0.205 m  AORTA Ao Root diam: 2.60 cm Ao Asc diam:  2.40 cm  MITRAL VALVE MV Area (PHT): 3.48 cm    SHUNTS MV Decel Time: 218 msec    Systemic VTI:  0.20 m MV E velocity: 92.80 cm/s  Systemic Diam: 2.00 cm MV A velocity: 98.50 cm/s MV E/A ratio:  0.94  Charlton Haws MD Electronically signed by Charlton Haws MD Signature Date/Time: 05/03/2021/6:27:59 PM    Final     CT SCANS  CT CORONARY MORPH W/CTA COR  W/SCORE 05/18/2020  Addendum 05/18/2020  5:32 PM ADDENDUM REPORT: 05/18/2020 17:29  CLINICAL DATA:  16F with pericarditis and chest pain.  EXAM: Cardiac/Coronary  CT  TECHNIQUE: The patient was scanned on a [employer].  FINDINGS: A 120 kV prospective scan was triggered in the descending thoracic aorta at 111 HU's. Axial non-contrast 3 mm slices were carried out through the heart. The data set was analyzed on a dedicated work station and scored using the Agatson method. Gantry rotation speed was 250 msecs and collimation was .6 mm. No beta blockade and 0.8 mg of sl NTG was given. The 3D data set was reconstructed in 5% intervals of the 67-82 % of the R-R cycle. Diastolic phases were analyzed on a dedicated work station using MPR, MIP and VRT modes. The patient received 80 cc of contrast.  Aorta: Normal size. Ascending aorta 3.0 cm. No calcifications. No dissection.  Aortic Valve:  Trileaflet.  No calcifications.  Coronary Arteries:  Normal coronary origin.  Right dominance.  RCA is a large dominant artery that gives rise to PDA and PLVB. There is no plaque.  Left main is a large artery that gives rise to LAD and LCX arteries.  LAD is a large vessel that has  no plaque.  LCX is a non-dominant artery that gives rise to two large OM branches. There is no plaque.  Other findings:  Normal pulmonary vein drainage into the left atrium.  Normal let atrial appendage without a thrombus.  Normal size of the pulmonary artery.  IMPRESSION: 1. Coronary calcium score of 0. This was 0 percentile for age and sex matched control.  2. Normal coronary origin with right dominance.  3. No evidence of CAD.  Chilton Si, MD   Electronically Signed By: Chilton Si On: 05/18/2020 17:29  Narrative EXAM: OVER-READ INTERPRETATION  CT CHEST  The following report is an over-read performed by radiologist Dr. Trudie Reed of [facility] on  05/18/2020. This over-read does not include interpretation of cardiac or coronary anatomy or pathology. The coronary calcium score/coronary CTA interpretation by the cardiologist is attached.  COMPARISON:  None.  FINDINGS: Within the visualized portions of the thorax there are no suspicious appearing pulmonary nodules or masses, there is no acute consolidative airspace disease, no pleural effusions, no pneumothorax and no lymphadenopathy. Visualized portions of the upper abdomen are unremarkable. There are no aggressive appearing lytic or blastic lesions noted in the visualized portions of the skeleton.  IMPRESSION: No significant incidental noncardiac findings are noted.  Electronically Signed: By: Trudie Reed M.D. On: 05/18/2020 11:51            Current Reported Medications:.    Current Meds  Medication Sig   amphetamine-dextroamphetamine (ADDERALL XR) 15 MG 24 hr capsule Take 15 mg by mouth every morning.   citalopram (CELEXA) 20 MG tablet Take 20 mg by mouth daily.   EPINEPHrine 0.3 mg/0.3 mL IJ SOAJ injection Inject into the muscle.   fexofenadine-pseudoephedrine (ALLEGRA-D ALLERGY & CONGESTION) 180-240 MG 24 hr tablet Take 1 tablet by mouth as needed.   hydrOXYzine (ATARAX) 25 MG tablet Take 25 mg by mouth at bedtime as needed.   ibuprofen (ADVIL) 200 MG tablet Take 800 mg (4 tablets) three times a day for 2 weeks. Take 600 mg (3 tablets) three times a day of 1 week. Take 400 mg (2 tablets) three times a a day for 1 week. Then once a day as needed   ibuprofen (ADVIL) 800 MG tablet Take 1 tablet (800 mg total) by mouth 3 (three) times daily. For 2 weeks   losartan (COZAAR) 25 MG tablet Take 25 mg by mouth every morning.   Multiple Vitamins-Minerals (CENTRUM SILVER 50+WOMEN) TABS Take 1 tablet by mouth daily.   pantoprazole (PROTONIX) 40 MG tablet Take 1 tablet once a day for 1 month.   UNABLE TO FIND Take by mouth daily. Med Name: GOLI APPLE CIDER VINEGAR   UNABLE TO  FIND Take by mouth daily. Med Name: Wynema Birch    Physical Exam:    VS:  BP 126/82   Pulse 66   Ht 5' 6.5" (1.689 m)   Wt 194 lb 9.6 oz (88.3 kg)   LMP 09/11/2015   SpO2 100%   BMI 30.94 kg/m    Wt Readings from Last 3 Encounters:  07/21/23 194 lb 9.6 oz (88.3 kg)  04/16/22 190 lb (86.2 kg)  02/06/22 186 lb (84.4 kg)    GEN: Well nourished, well developed in no acute distress NECK: No JVD; No carotid bruits CARDIAC: RRR, no murmurs, rubs, gallops RESPIRATORY:  Clear to auscultation without rales, wheezing or rhonchi  ABDOMEN: Soft, non-tender, non-distended EXTREMITIES:  No edema; No acute deformity   Asessement and Plan:.  Pericarditis/Chest pain: Patient was admitted in 2019 for pericarditis, treated with ibuprofen and colchicine.  She presents today reporting increased chest pressure and slight shortness of breath that started two weeks ago and has similar presentation to her prior episodes of pericarditis.  She notes increased pain when leaning forward and when taking a deep breath.  She reports that she tried some ibuprofen last week with improvement in pain however towards the end of the week her pain became more persistent.  Her EKG today indicates normal sinus rhythm at 66 bpm, no significant ST/T wave abnormalities.  Her blood pressure is stable at 126/82, no evidence of cardiac tamponade.  Reviewed ED precautions.  Will obtain stat echocardiogram to assess for pericardial effusion.  Check CBC, BMET, CRP and Sed rate.  She will start on ibuprofen 800 mg 3 times a day for the next 2 weeks, followed by 600 mg 3 times a day for a week and 400 mg 3 times a day for a week.  Will also start Protonix 40 mg daily. Will plan to start colchicine 0.6 mg twice daily for 3 months pending results of labs.  Hypertension: Blood pressure today 126/82. On Losartan 25 mg daily. Monitored and managed per PCP.     Disposition: F/u with Dr. Duke Salvia on 07/27/23 (Patient request to keep  appointment)   Signed, Rip Harbour, NP

## 2023-07-21 NOTE — Patient Instructions (Signed)
Medication Instructions:  Take Ibuprofen 800 mg (4 tablets) three times a day for 2 weeks. Then take 600 mg (3 tablets) three times a day of 1 week. Then take 400 mg (2 tablets) three times a a day for 1 week. Then once a day as needed Start Protonix 40 mg once a day for 1 month *If you need a refill on your cardiac medications before your next appointment, please call your pharmacy*   Lab Work: Today we will draw CBC, BMP, sed rate, and CRP If you have labs (blood work) drawn today and your tests are completely normal, you will receive your results only by: MyChart Message (if you have MyChart) OR A paper copy in the mail If you have any lab test that is abnormal or we need to change your treatment, we will call you to review the results.   Testing/Procedures: Stat Echocardiogram  Your physician has requested that you have an echocardiogram. Echocardiography is a painless test that uses sound waves to create images of your heart. It provides your doctor with information about the size and shape of your heart and how well your heart's chambers and valves are working. This procedure takes approximately one hour. There are no restrictions for this procedure. Please do NOT wear cologne, perfume, aftershave, or lotions (deodorant is allowed). Please arrive 15 minutes prior to your appointment time.  Please note: We ask at that you not bring children with you during ultrasound (echo/ vascular) testing. Due to room size and safety concerns, children are not allowed in the ultrasound rooms during exams. Our front office staff cannot provide observation of children in our lobby area while testing is being conducted. An adult accompanying a patient to their appointment will only be allowed in the ultrasound room at the discretion of the ultrasound technician under special circumstances. We apologize for any inconvenience.  Follow-Up: At [facility], you and your health needs are our  priority.  As part of our continuing mission to provide you with exceptional heart care, we have created designated Provider Care Teams.  These Care Teams include your primary Cardiologist (physician) and Advanced Practice Providers (APPs -  Physician Assistants and Nurse Practitioners) who all work together to provide you with the care you need, when you need it.  We recommend signing up for the patient portal called "MyChart".  Sign up information is provided on this After Visit Summary.  MyChart is used to connect with patients for Virtual Visits (Telemedicine).  Patients are able to view lab/test results, encounter notes, upcoming appointments, etc.  Non-urgent messages can be sent to your provider as well.   To learn more about what you can do with MyChart, go to [email or web address].    Your next appointment:   1 month(s)  Provider:   Reather Littler, NP

## 2023-07-22 ENCOUNTER — Ambulatory Visit: Payer: BC Managed Care – PPO | Attending: Cardiovascular Disease

## 2023-07-22 ENCOUNTER — Telehealth: Payer: Self-pay

## 2023-07-22 DIAGNOSIS — R079 Chest pain, unspecified: Secondary | ICD-10-CM

## 2023-07-22 DIAGNOSIS — I319 Disease of pericardium, unspecified: Secondary | ICD-10-CM | POA: Diagnosis not present

## 2023-07-22 LAB — CBC
Hematocrit: 42.7 % (ref 34.0–46.6)
Hemoglobin: 14.3 g/dL (ref 11.1–15.9)
MCH: 31 pg (ref 26.6–33.0)
MCHC: 33.5 g/dL (ref 31.5–35.7)
MCV: 92 fL (ref 79–97)
Platelets: 281 10*3/uL (ref 150–450)
RBC: 4.62 x10E6/uL (ref 3.77–5.28)
RDW: 13.4 % (ref 11.7–15.4)
WBC: 8.1 10*3/uL (ref 3.4–10.8)

## 2023-07-22 LAB — BASIC METABOLIC PANEL
BUN/Creatinine Ratio: 19 (ref 9–23)
BUN: 15 mg/dL (ref 6–24)
CO2: 22 mmol/L (ref 20–29)
Calcium: 9.8 mg/dL (ref 8.7–10.2)
Chloride: 103 mmol/L (ref 96–106)
Creatinine, Ser: 0.77 mg/dL (ref 0.57–1.00)
Glucose: 80 mg/dL (ref 70–99)
Potassium: 4.5 mmol/L (ref 3.5–5.2)
Sodium: 145 mmol/L — ABNORMAL HIGH (ref 134–144)
eGFR: 93 mL/min/{1.73_m2} (ref >59)

## 2023-07-22 LAB — SEDIMENTATION RATE: Sed Rate: 19 mm/h (ref 0–40)

## 2023-07-22 LAB — C-REACTIVE PROTEIN: CRP: <1 mg/L (ref 0–10)

## 2023-07-22 MED ORDER — COLCHICINE 0.6 MG PO TABS: 0.6000 mg | ORAL_TABLET | Freq: Two times a day (BID) | ORAL | 0 refills | Status: DC

## 2023-07-22 MED ORDER — COLCHICINE 0.6 MG PO TABS: 0.6000 mg | ORAL_TABLET | Freq: Every day | ORAL | 0 refills | Status: DC

## 2023-07-22 NOTE — Addendum Note (Signed)
Addended by: Reather Littler on: 07/22/2023 06:12 PM   Modules accepted: Orders

## 2023-07-22 NOTE — Telephone Encounter (Signed)
Please let [name] know that her CBC shows no evidence of anemia nor infection, her kidney function and electrolytes are normal. Her inflammatory markers have not fully resulted yet, however given her history will go ahead and send a prescription for colchicine 0.6 mg twice daily for three months.   Per [name]  Called patient advised of above they verbalized understanding.

## 2023-07-23 LAB — ECHOCARDIOGRAM COMPLETE
AR max vel: 2.4 cm2
AV Area VTI: 2.44 cm2
AV Area mean vel: 2.35 cm2
AV Mean grad: 5 mm[Hg]
AV Peak grad: 8.8 mm[Hg]
Ao pk vel: 1.48 m/s
Area-P 1/2: 4.68 cm2
Calc EF: 57 %
S' Lateral: 3.4 cm
Single Plane A2C EF: 58.7 %
Single Plane A4C EF: 53.2 %

## 2023-07-27 ENCOUNTER — Encounter (HOSPITAL_BASED_OUTPATIENT_CLINIC_OR_DEPARTMENT_OTHER): Payer: Self-pay | Admitting: Cardiovascular Disease

## 2023-07-27 ENCOUNTER — Telehealth: Payer: Self-pay

## 2023-07-27 ENCOUNTER — Ambulatory Visit (HOSPITAL_BASED_OUTPATIENT_CLINIC_OR_DEPARTMENT_OTHER): Payer: BC Managed Care – PPO | Admitting: Cardiovascular Disease

## 2023-07-27 ENCOUNTER — Encounter (HOSPITAL_BASED_OUTPATIENT_CLINIC_OR_DEPARTMENT_OTHER): Payer: Self-pay | Admitting: *Deleted

## 2023-07-27 VITALS — BP 144/86 | HR 65 | Ht 66.0 in | Wt 194.1 lb

## 2023-07-27 DIAGNOSIS — Z5181 Encounter for therapeutic drug level monitoring: Secondary | ICD-10-CM

## 2023-07-27 DIAGNOSIS — I319 Disease of pericardium, unspecified: Secondary | ICD-10-CM | POA: Diagnosis not present

## 2023-07-27 DIAGNOSIS — E66811 Obesity, class 1: Secondary | ICD-10-CM

## 2023-07-27 DIAGNOSIS — E7800 Pure hypercholesterolemia, unspecified: Secondary | ICD-10-CM

## 2023-07-27 MED ORDER — COLCHICINE 0.6 MG PO TABS: 0.6000 mg | ORAL_TABLET | Freq: Two times a day (BID) | ORAL | 1 refills | Status: DC

## 2023-07-27 MED ORDER — LOSARTAN POTASSIUM 50 MG PO TABS: 50.0000 mg | ORAL_TABLET | Freq: Every morning | ORAL | 3 refills | Status: DC

## 2023-07-27 NOTE — Telephone Encounter (Signed)
Called patient advised of below they verbalized understanding.

## 2023-07-27 NOTE — Progress Notes (Signed)
Cardiology Office Note:  .   Date:  07/27/2023  ID:  [name], DOB 08/25/1971, MRN [id number] PCP: Harvest Forest, MD  [facility] Providers Cardiologist:  Chilton Si, MD    History of Present Illness: .   Ashalee Olivia is a 52 y.o. female with pericarditis, hypertension, and hyperlipidemia who presents for follow up.  Ms. Symington was admitted 04/2018 with chest pain.  She has a history of palpitations that she attributes to stress at work.  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  However, for the week before she went to the hospital she noted feeling more anxious.  She had no URI at the time.  She was seen in the emergency department where EKG was initially concerning for STEMI.  Troponin was elevated to 0.75 and ESR was 30.  She was thought to have myopericarditis and was started on ibuprofen and colchicine.  She was also given a prescription for oxycodone.  She followed up in clinic 9/12 and was doing better but still had some pleuritic chest pain.  She started to become more active but developed recurrent chest pain.  She had a repeat echocardiogram 05/14/2018 that revealed LVEF 60 to 65% with no pericardial effusion.  She did have a left pleural effusion that was unchanged from the hospital.  She followed up with her rheumatologist, Dr. Zenovia Jordan, 04/2018.  She was noted to have a mildly positive rheumatoid factor, ESR, and CRP.  She tried tapering her ibuprofen and had recurrent pain after decreasing to twice a day.  It was decided that she would resume both ibuprofen and colchicine for the holidays.    Ms. Betler was seen by Joni Reining, DNP on 05/2019.  She was doing well at that time.  She saw her rheumatologist and started back on colchicine, ibuprofen and nebivolol.  It has subsequently been discontinued.  Overall she has felt well physically.  She had an episode of chest tightness that occurred with stress.  She  had a coronary CT-A 04/2020 that revealed no coronary calcium and no CAD.  Ms. Pagliuca was seen in the ED 03/2021 with chest pain like her prior pericarditis.  She felt tightness in her chest and it was worse laying down.  She had a limited bedside echocardiogram by the ED physician and was not noted to have a pericardial effusion.  Cardiac enzymes were negative.  Her symptoms improved with Toradol.  She was started back on colchicine. By the time she followed up in clinic the next month she was feeling better. She had a full Echo 04/2021 that was unremarkable and had no pericardial effusion.    At her visit 08/2021 she was doing well.  She saw Reather Littler, NP 06/2023 and reported symptoms of recurrent pericarditis.  She was started on colchicine and ibuprofen.  ESR and CRP were within normal limits.   Ms. Fullilove presents with a recent increase in chest discomfort. The onset of symptoms was noted in October, following a period of intense physical activity in a moldy environment at work. The discomfort became more consistent two weeks ago. The patient describes the discomfort as a feeling of tightness, similar to previous episodes of pericarditis.  She has been managing the discomfort with ibuprofen and colchicine.   Ms. Wikel reports that the prescribed colchicine and ibuprofen have provided some relief, but not complete resolution of symptoms. The patient also notes a cough triggered by deep breaths  and a sensation of pressure in the chest.    Ms. Chirillo is concerned that her work environment, involving heavy lifting and exposure to mold, is suspected to have contributed to the current episode. She has been experiencing high levels of stress due to a recent job change and has also been diagnosed with high blood pressure, anxiety, and depression.  She recently re-started Adderall.  [name] blood pressure has been well-controlled at home, but was noted to be high during the current consultation. The  patient is currently on losartan for blood pressure management.      ROS:  As per HPI  Studies Reviewed: .       Echo 07/22/23:  1. Left ventricular ejection fraction, by estimation, is 60 to 65%. The  left ventricle has normal function. The left ventricle has no regional  wall motion abnormalities. Left ventricular diastolic parameters were  normal. The average left ventricular  global longitudinal strain is -16.1 %.   2. Right ventricular systolic function is normal. The right ventricular  size is normal. There is normal pulmonary artery systolic pressure. The  estimated right ventricular systolic pressure is 24.5 mmHg.   3. The mitral valve is normal in structure. Mild mitral valve  regurgitation. No evidence of mitral stenosis.   4. The aortic valve is tricuspid. Aortic valve regurgitation is not  visualized. No aortic stenosis is present.   5. The inferior vena cava is normal in size with greater than 50%  respiratory variability, suggesting right atrial pressure of 3 mmHg.    Risk Assessment/Calculations:     HYPERTENSION CONTROL Vitals:   07/27/23 1436 07/27/23 1551  BP: (!) 140/82 (!) 144/86    [name] blood pressure is elevated above target today.  In order to address the patient's elevated BP: A current anti-hypertensive medication was adjusted today.      Physical Exam:   VS:  BP (!) 144/86   Pulse 65   Ht 5\' 6"  (1.676 m)   Wt 194 lb 1.6 oz (88 kg)   LMP 09/11/2015   SpO2 99%   BMI 31.33 kg/m  , BMI Body mass index is 31.33 kg/m. GENERAL:  Well appearing HEENT: Pupils equal round and reactive, fundi not visualized, oral mucosa unremarkable NECK:  No jugular venous distention, waveform within normal limits, carotid upstroke brisk and symmetric, no bruits, no thyromegaly LUNGS:  Clear to auscultation bilaterally HEART:  RRR.  PMI not displaced or sustained,S1 and S2 within normal limits, no S3, no S4, no clicks, no rubs, no murmurs ABD:  Flat,  positive bowel sounds normal in frequency in pitch, no bruits, no rebound, no guarding, no midline pulsatile mass, no hepatomegaly, no splenomegaly EXT:  2 plus pulses throughout, no edema, no cyanosis no clubbing SKIN:  No rashes no nodules NEURO:  Cranial nerves II through XII grossly intact, motor grossly intact throughout PSYCH:  Cognitively intact, oriented to person place and time   ASSESSMENT AND PLAN: .    # Recurrent Pericarditis Recurrent chest discomfort, similar to previous episodes of pericarditis. Symptoms improved with Colchicine and Ibuprofen, but not completely resolved. Discussed the option of Arcalyst (Rilonacept), a once-weekly injectable medication, for prevention of recurrent episodes. -Continue Colchicine once daily for 3 months. -Continue Ibuprofen twice daily until pain is completely resolved, then taper as previously instructed. -Initiate process for obtaining Arcalyst (Rilonacept) through insurance prior authorization.  # Hypertension Elevated blood pressure at today's visit both initially and on repeat. Patient reports stress and depression  related to work environment. -Increase Losartan to 50mg  daily. -Check blood pressure at home.  BP goal <130/80 -Order basic metabolic panel to assess kidney function and potassium levels after Losartan dose increase.  # Work-related physical stress Patient reports heavy lifting at work, which may be contributing to recurrent pericarditis. -Provide note for work to restrict heavy lifting.  Hopefully this will help prevent flare up of her pericarditis.   Follow-up -Schedule appointment with pharmacist for blood pressure check and initiation of Arcalyst. -Schedule follow-up appointment in 3 months to assess stabilization of symptoms.    Signed, Chilton Si, MD

## 2023-07-27 NOTE — Telephone Encounter (Signed)
-----   Message from Rip Harbour sent at 07/27/2023  1:25 PM EST ----- Please let [name] know that her echocardiogram showed normal heart squeeze and function.  There were no significant valvular abnormalities.  There is no evidence of fluid accumulation around her heart. Follow up as planned.

## 2023-07-27 NOTE — Patient Instructions (Addendum)
Medication Instructions:  CONTINUE IBUPROFEN UNTIL PAIN FREE, THEN TAPER OFF AS DIRECTED   CONTINUE COLCHICINE FOR 3 MONTHS   INCREASE LOSARTAN TO 50 MG DAILY  *If you need a refill on your cardiac medications before your next appointment, please call your pharmacy*  Lab Work: BMET IN 1 WEEK   If you have labs (blood work) drawn today and your tests are completely normal, you will receive your results only by: MyChart Message (if you have MyChart) OR A paper copy in the mail If you have any lab test that is abnormal or we need to change your treatment, we will call you to review the results.  Testing/Procedures: NONE  Follow-Up: At [facility], you and your health needs are our priority.  As part of our continuing mission to provide you with exceptional heart care, we have created designated Provider Care Teams.  These Care Teams include your primary Cardiologist (physician) and Advanced Practice Providers (APPs -  Physician Assistants and Nurse Practitioners) who all work together to provide you with the care you need, when you need it.  We recommend signing up for the patient portal called "MyChart".  Sign up information is provided on this After Visit Summary.  MyChart is used to connect with patients for Virtual Visits (Telemedicine).  Patients are able to view lab/test results, encounter notes, upcoming appointments, etc.  Non-urgent messages can be sent to your provider as well.   To learn more about what you can do with MyChart, go to [email or web address].    Your next appointment:   3 month(s)  Provider:   Chilton Si, MD or Gillian Shields, NP    PHARM D SOON

## 2023-08-03 ENCOUNTER — Encounter (HOSPITAL_BASED_OUTPATIENT_CLINIC_OR_DEPARTMENT_OTHER): Payer: Self-pay | Admitting: Pharmacist Clinician (PhC)/ Clinical Pharmacy Specialist

## 2023-08-03 ENCOUNTER — Ambulatory Visit (HOSPITAL_BASED_OUTPATIENT_CLINIC_OR_DEPARTMENT_OTHER): Payer: BC Managed Care – PPO | Admitting: Pharmacist Clinician (PhC)/ Clinical Pharmacy Specialist

## 2023-08-03 VITALS — Ht 66.0 in | Wt 193.8 lb

## 2023-08-03 DIAGNOSIS — I319 Disease of pericardium, unspecified: Secondary | ICD-10-CM

## 2023-08-03 NOTE — Assessment & Plan Note (Signed)
Patient with recurrent pericarditis, currently using ibuprofen 800 mg tid and colchicine 0.1 mg every day.  Continues to have symptoms when tries to wean off medications.   Will submit paperwork to [facility] to get patient enrolled.  Reviewed side effects and dosing.  Will arrange for [employer] to give injection training support.

## 2023-08-03 NOTE — Progress Notes (Signed)
Office Visit    Patient Name: [name] Date of Encounter: 08/03/2023  Primary Care Provider:  Harvest Forest, MD Primary Cardiologist:  Chilton Si, MD  Chief Complaint    Recurrent pericarditis  Significant Past Medical History   HTN Pt reports stressful work environment - increased losartan at last visit    Allergies  Allergen Reactions   Iodine Anaphylaxis   Other Anaphylaxis   Shellfish Allergy Anaphylaxis    History of Present Illness    [name] is a 52 y.o. female patient of Dr Duke Salvia is in the office today to discuss potential use of Arcalyst for recurrent pericarditis.  She had her first episode in 2019, and at ED her EKG was initially concerning for STEMI.  She had another episode in 2020 and restarted the colchicine and ibuprofen again.  Another ED visit in 2022, then did well until 2024 when she reported symptoms again.  She continues to use colchicine and ibuprofen, and notes that when she tries to taper the ibuprofen she feels the symptoms return.    Blood Pressure Goal:  130/80  Current Medications:  ibuprofen 800 mg tid, colchicine 0.6 mg bid  Family Hx:   mother (HTN, HLD, DM, thyroid cancer), father (HF, lymphoma)  Social Hx:      Tobacco: no  Alcohol: no  Accessory Clinical Findings    Lab Results  Component Value Date   CREATININE 0.77 07/21/2023   BUN 15 07/21/2023   NA 145 (H) 07/21/2023   K 4.5 07/21/2023   CL 103 07/21/2023   CO2 22 07/21/2023   Lab Results  Component Value Date   ALT 24 12/18/2020   AST 21 12/18/2020   ALKPHOS 81 12/18/2020   BILITOT 0.4 12/18/2020   Lab Results  Component Value Date   HGBA1C 5.6 12/18/2020    Home Medications    Current Outpatient Medications  Medication Sig Dispense Refill   amphetamine-dextroamphetamine (ADDERALL XR) 15 MG 24 hr capsule Take 15 mg by mouth every morning.     Cholecalciferol (VITAMIN D3) 50 MCG (2000 UT) TABS Take 4,000  Int'l Units by mouth daily. (Patient not taking: Reported on 07/21/2023) 30 tablet 0   citalopram (CELEXA) 20 MG tablet Take 20 mg by mouth daily.     colchicine 0.6 MG tablet Take 1 tablet (0.6 mg total) by mouth 2 (two) times daily. 180 tablet 1   Cyanocobalamin (VITAMIN B-12 PO) Take 5,000 mcg by mouth daily.     EPINEPHrine 0.3 mg/0.3 mL IJ SOAJ injection Inject into the muscle.     fexofenadine-pseudoephedrine (ALLEGRA-D ALLERGY & CONGESTION) 180-240 MG 24 hr tablet Take 1 tablet by mouth as needed.     hydrOXYzine (ATARAX) 25 MG tablet Take 25 mg by mouth at bedtime as needed.     ibuprofen (ADVIL) 200 MG tablet Take 800 mg (4 tablets) three times a day for 2 weeks. Take 600 mg (3 tablets) three times a day of 1 week. Take 400 mg (2 tablets) three times a a day for 1 week. Then once a day as needed 280 tablet 0   ibuprofen (ADVIL) 800 MG tablet Take 1 tablet (800 mg total) by mouth 3 (three) times daily. For 2 weeks 42 tablet 1   losartan (COZAAR) 50 MG tablet Take 1 tablet (50 mg total) by mouth every morning. 90 tablet 3   Multiple Vitamins-Minerals (CENTRUM SILVER 50+WOMEN) TABS Take 1 tablet by mouth daily.  pantoprazole (PROTONIX) 40 MG tablet Take 1 tablet once a day for 1 month. 30 tablet 0   UNABLE TO FIND Take by mouth daily. Med Name: GOLI APPLE CIDER VINEGAR     UNABLE TO FIND Take by mouth daily. Med Name: GOLI BEETS     No current facility-administered medications for this visit.     Assessment & Plan    Pericarditis Patient with recurrent pericarditis, currently using ibuprofen 800 mg tid and colchicine 0.1 mg every day.  Continues to have symptoms when tries to wean off medications.   Will submit paperwork to [facility] to get patient enrolled.  Reviewed side effects and dosing.  Will arrange for [employer] to give injection training support.     Phillips Hay PharmD CPP [facility]  [address] [place], [place]  [address] [phone]

## 2023-08-03 NOTE — Patient Instructions (Signed)
We will get the paperwork processed to get Arcalyst approved by your insurance company.    The manufacturer has a clinical educator who will guide you through the first dose (virtually or in person).    The first dose will be 2 injections of 160 mg each on day 1.  After that you will inject 1 dose (160 mg) once weekly.    Helpful hints for Arcalyst:  Keep vials in carton until ready to use Store in the refrigerator (36-46 degreed F)  Use within 3 hours of mixing

## 2023-08-04 ENCOUNTER — Telehealth: Payer: Self-pay | Admitting: Cardiovascular Disease

## 2023-08-04 NOTE — Telephone Encounter (Signed)
Left detailed message not found on 2nd floor our in lab upstairs

## 2023-08-04 NOTE — Telephone Encounter (Signed)
Patient states she thinks she left her umbrella in the office yesterday and she would like to know if it has been recovered. She states it is a large, blue and gold, [employer] umbrella and she thinks when she went into the office with Phillips Hay she sat it in a corner by a chair. Please advise.

## 2023-08-05 ENCOUNTER — Telehealth: Payer: Self-pay | Admitting: Pharmacy Technician

## 2023-08-05 ENCOUNTER — Telehealth: Payer: Self-pay | Admitting: Pharmacist Clinician (PhC)/ Clinical Pharmacy Specialist

## 2023-08-05 ENCOUNTER — Other Ambulatory Visit (HOSPITAL_COMMUNITY): Payer: Self-pay

## 2023-08-05 NOTE — Telephone Encounter (Signed)
Could you please do PA for Arcalyst?  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

## 2023-08-05 NOTE — Telephone Encounter (Addendum)
Pharmacy Patient Advocate Encounter   Received notification from Pt Calls Messages that prior authorization for arcalyst is required/requested.   Insurance verification completed.   The patient is insured through [facility] ADVANTAGE/RX ADVANCE .   Per test claim: PA required; PA submitted to above mentioned insurance via CoverMyMeds Key/confirmation #/EOC [id number] Status is pending

## 2023-08-07 NOTE — Telephone Encounter (Signed)
 Prior Authorization form/request asks a question that requires your assistance. Please see the question below and advise accordingly.

## 2023-08-10 NOTE — Telephone Encounter (Signed)
Loraine Leriche is calling for the prior auth for the pt's medication. Please advise   Reference# [id number]

## 2023-08-11 NOTE — Telephone Encounter (Signed)
Sent message to pt to find out.

## 2023-08-24 ENCOUNTER — Ambulatory Visit: Payer: BC Managed Care – PPO | Admitting: Cardiology

## 2023-08-27 ENCOUNTER — Ambulatory Visit
Admission: RE | Admit: 2023-08-27 | Discharge: 2023-08-27 | Disposition: A | Discharge status: Home / Self Care | Payer: 59 | Source: Ambulatory Visit | Attending: Internal Medicine | Admitting: Internal Medicine

## 2023-08-27 DIAGNOSIS — Z1231 Encounter for screening mammogram for malignant neoplasm of breast: Secondary | ICD-10-CM

## 2023-09-04 NOTE — Telephone Encounter (Signed)
 PA request has been  put on hold pending questions .

## 2023-09-10 ENCOUNTER — Telehealth (HOSPITAL_BASED_OUTPATIENT_CLINIC_OR_DEPARTMENT_OTHER): Payer: Self-pay | Admitting: Pharmacist Clinician (PhC)/ Clinical Pharmacy Specialist

## 2023-09-10 NOTE — Telephone Encounter (Signed)
Patient came in to provide new insurance information and proof of TB test for a new medication. Confirmed that updated insurance info is in pt's chart and gave the proof of PPD results to [facility] for entry. Will send form to [facility] for upload.

## 2023-09-15 ENCOUNTER — Other Ambulatory Visit (HOSPITAL_COMMUNITY): Payer: Self-pay

## 2023-09-15 ENCOUNTER — Telehealth: Payer: Self-pay | Admitting: Pharmacy Technician

## 2023-09-15 NOTE — Telephone Encounter (Signed)
Pharmacy Patient Advocate Encounter  Received notification from [facility] ADVANTAGE/RX ADVANCE that Prior Authorization for arcalyst has been APPROVED from 09/15/23 to 09/14/24   PA #/Case ID/Reference #: [id number]

## 2023-09-15 NOTE — Telephone Encounter (Signed)
TB test scanned into chart 09/15/23

## 2023-09-15 NOTE — Telephone Encounter (Signed)
Pharmacy Patient Advocate Encounter   Received notification from Pt Calls Messages that prior authorization for arcalyst is required/requested.   Insurance verification completed.   The patient is insured through [facility] ADVANTAGE/RX ADVANCE .   Per test claim: PA required; PA submitted to above mentioned insurance via CoverMyMeds Key/confirmation #/EOC [id number] Status is pending

## 2023-09-15 NOTE — Telephone Encounter (Signed)
Pharmacy Patient Advocate Encounter   Received notification from Pt Calls Messages that prior authorization for arcalyst is required/requested.   Insurance verification completed.   The patient is insured through [facility] ADVANTAGE/RX ADVANCE .   Per test claim: PA required; PA started via CoverMyMeds. KEY [id number] . Waiting for clinical questions to populate.

## 2023-09-16 NOTE — Telephone Encounter (Signed)
Information has been sent to Roxine Caddy D

## 2023-09-16 NOTE — Telephone Encounter (Signed)
[employer] paperwork faxed regarding Arcolyst prescription

## 2023-11-02 ENCOUNTER — Encounter (HOSPITAL_BASED_OUTPATIENT_CLINIC_OR_DEPARTMENT_OTHER): Payer: Self-pay | Admitting: Family

## 2023-11-02 ENCOUNTER — Ambulatory Visit (HOSPITAL_BASED_OUTPATIENT_CLINIC_OR_DEPARTMENT_OTHER): Payer: 59 | Admitting: Family

## 2023-11-02 VITALS — BP 130/82 | HR 83 | Ht 67.0 in | Wt 200.0 lb

## 2023-11-02 DIAGNOSIS — I300 Acute nonspecific idiopathic pericarditis: Secondary | ICD-10-CM | POA: Diagnosis not present

## 2023-11-02 DIAGNOSIS — Z6831 Body mass index (BMI) 31.0-31.9, adult: Secondary | ICD-10-CM | POA: Diagnosis not present

## 2023-11-02 DIAGNOSIS — I10 Essential (primary) hypertension: Secondary | ICD-10-CM

## 2023-11-02 NOTE — Progress Notes (Signed)
 Cardiology Office Note:  .   Date:  11/02/2023  ID:  [name], DOB 1970-11-12, MRN [id number] PCP: Harvest Forest, MD  [facility] Providers Cardiologist:  Chilton Si, MD    History of Present Illness: .   [name] is a 53 y.o. female with history of pericarditis, hypertension, hyperlipidemia, anxiety.  Abdomen 04/2018 with chest pain thought to have myopericarditis and started on ibuprofen and colchicine.  Repeat echo 04/2018 LVEF 60 to 35%, no pericardial effusion.  She did have stable left pleural effusion.  She saw her rheumatologist 04/2018 with mildly positive rheumatoid factor, ESR, CRP.  She tried tapering ibuprofen and had recurrent symptoms.  At follow-up 05/2019 she was off of colchicine, ibuprofen, nebivolol and feeling well.  Coronary CTA 04/2020 no coronary calcium and no coronary artery disease.  ED visit 03/2021 with chest pain similar to prior pericarditis.  Limited echo at bedside with pericardial effusion.  Cardiac enzymes negative.  She was started back on colchicine.  Echo 04/2021 unremarkable with no pericardial effusion.  At visit 06/2023 reported symptoms of recurrent pericarditis.  She was started on colchicine and ibuprofen.  ESR and CRP were within normal limits.  Seen by Dr. Duke Salvia 07/27/2023 with increase in chest discomfort.  She was referred to Pharm.D. and started on Arcalyst for recurrent pericarditis.  Losartan increased to 50 mg daily for blood pressure control.  Zentz today for follow-up independently.  She has been taking Arcalyst for about a month.  She is tolerating well but does note some anxiety surrounding injections.  Continues to follow with psychiatry and her counselor very closely.  Her PCP last week increased her losartan to 100 mg daily due to elevated blood pressure.  She has blood pressure cuff at home but has not been checking routinely.  She is no longer requiring colchicine or NSAID.  She has  been walking for exercise and has a goal to resume some of her weight lifting at home.  She is presently only eating 1 meal per day and we discussed increasing protein and fiber intake to facilitate weight loss.  ROS: Please see the history of present illness.    All other systems reviewed and are negative.   Studies Reviewed: .        Cardiac Studies & Procedures   ______________________________________________________________________________________________     ECHOCARDIOGRAM  ECHOCARDIOGRAM COMPLETE 07/22/2023  Narrative ECHOCARDIOGRAM REPORT    Patient Name:   [name] Date of Exam: 07/22/2023 Medical Rec #:  [id number]                    Height:       66.5 in Accession #:    [id number]                   Weight:       194.6 lb Date of Birth:  1971-05-23                    BSA:          1.988 m Patient Age:    52 years                     BP:           126/82 mmHg Patient Gender: F  HR:           70 bpm. Exam Location:  [place]  Procedure: 2D Echo, 3D Echo, Cardiac Doppler, Color Doppler and Strain Analysis  Indications:    R07.9* Chest pain, unspecified  History:        Patient has prior history of Echocardiogram examinations, most recent 05/03/2021. CAD, Previous Myocardial Infarction and Pericardial Disease, Signs/Symptoms:Chest Pain; Risk Factors:Non-Smoker and Dyslipidemia.  Sonographer:    Quentin Ore RDMS, RVT, RDCS Referring Phys: [id number] [facility]  IMPRESSIONS   1. Left ventricular ejection fraction, by estimation, is 60 to 65%. The left ventricle has normal function. The left ventricle has no regional wall motion abnormalities. Left ventricular diastolic parameters were normal. The average left ventricular global longitudinal strain is -16.1 %. 2. Right ventricular systolic function is normal. The right ventricular size is normal. There is normal pulmonary artery systolic pressure. The estimated right  ventricular systolic pressure is 24.5 mmHg. 3. The mitral valve is normal in structure. Mild mitral valve regurgitation. No evidence of mitral stenosis. 4. The aortic valve is tricuspid. Aortic valve regurgitation is not visualized. No aortic stenosis is present. 5. The inferior vena cava is normal in size with greater than 50% respiratory variability, suggesting right atrial pressure of 3 mmHg.  FINDINGS Left Ventricle: Left ventricular ejection fraction, by estimation, is 60 to 65%. The left ventricle has normal function. The left ventricle has no regional wall motion abnormalities. The average left ventricular global longitudinal strain is -16.1 %. The left ventricular internal cavity size was normal in size. There is no left ventricular hypertrophy. Left ventricular diastolic parameters were normal.  Right Ventricle: The right ventricular size is normal. No increase in right ventricular wall thickness. Right ventricular systolic function is normal. There is normal pulmonary artery systolic pressure. The tricuspid regurgitant velocity is 2.21 m/s, and with an assumed right atrial pressure of 5 mmHg, the estimated right ventricular systolic pressure is 24.5 mmHg.  Left Atrium: Left atrial size was normal in size.  Right Atrium: Right atrial size was normal in size.  Pericardium: There is no evidence of pericardial effusion.  Mitral Valve: The mitral valve is normal in structure. Mild mitral valve regurgitation. No evidence of mitral valve stenosis.  Tricuspid Valve: The tricuspid valve is normal in structure. Tricuspid valve regurgitation is mild . No evidence of tricuspid stenosis.  Aortic Valve: The aortic valve is tricuspid. Aortic valve regurgitation is not visualized. No aortic stenosis is present. Aortic valve mean gradient measures 5.0 mmHg. Aortic valve peak gradient measures 8.8 mmHg. Aortic valve area, by VTI measures 2.44 cm.  Pulmonic Valve: The pulmonic valve was normal in  structure. Pulmonic valve regurgitation is not visualized. No evidence of pulmonic stenosis.  Aorta: The aortic root is normal in size and structure.  Venous: The inferior vena cava is normal in size with greater than 50% respiratory variability, suggesting right atrial pressure of 3 mmHg.  IAS/Shunts: No atrial level shunt detected by color flow Doppler.   LEFT VENTRICLE PLAX 2D LVIDd:         5.00 cm      Diastology LVIDs:         3.40 cm      LV e' medial:    10.00 cm/s LV PW:         0.60 cm      LV E/e' medial:  10.5 LV IVS:        0.70 cm      LV e'  lateral:   13.20 cm/s LVOT diam:     2.00 cm      LV E/e' lateral: 8.0 LV SV:         75 LV SV Index:   38           2D Longitudinal Strain LVOT Area:     3.14 cm     2D Strain GLS Avg:     -16.1 %  LV Volumes (MOD) LV vol d, MOD A2C: 109.0 ml 3D Volume EF: LV vol d, MOD A4C: 110.0 ml 3D EF:        58 % LV vol s, MOD A2C: 45.0 ml  LV EDV:       114 ml LV vol s, MOD A4C: 51.5 ml  LV ESV:       48 ml LV SV MOD A2C:     64.0 ml  LV SV:        66 ml LV SV MOD A4C:     110.0 ml LV SV MOD BP:      63.2 ml  RIGHT VENTRICLE             IVC RV Basal diam:  3.30 cm     IVC diam: 1.40 cm RV S prime:     19.50 cm/s TAPSE (M-mode): 2.4 cm  LEFT ATRIUM             Index        RIGHT ATRIUM           Index LA diam:        3.40 cm 1.71 cm/m   RA Area:     12.10 cm LA Vol (A2C):   48.9 ml 24.60 ml/m  RA Volume:   28.00 ml  14.09 ml/m LA Vol (A4C):   31.1 ml 15.62 ml/m LA Biplane Vol: 44.4 ml 22.34 ml/m AORTIC VALVE                     PULMONIC VALVE AV Area (Vmax):    2.40 cm      PV Vmax:       1.12 m/s AV Area (Vmean):   2.35 cm      PV Peak grad:  5.1 mmHg AV Area (VTI):     2.44 cm AV Vmax:           148.00 cm/s AV Vmean:          101.000 cm/s AV VTI:            0.307 m AV Peak Grad:      8.8 mmHg AV Mean Grad:      5.0 mmHg LVOT Vmax:         113.00 cm/s LVOT Vmean:        75.500 cm/s LVOT VTI:          0.238  m LVOT/AV VTI ratio: 0.78  AORTA Ao Root diam: 2.80 cm Ao Asc diam:  3.00 cm Ao Arch diam: 2.7 cm  MITRAL VALVE                TRICUSPID VALVE MV Area (PHT): 4.68 cm     TR Peak grad:   19.5 mmHg MV Decel Time: 162 msec     TR Vmax:        221.00 cm/s MV E velocity: 105.00 cm/s MV A velocity: 97.50 cm/s   SHUNTS MV E/A ratio:  1.08         Systemic  VTI:  0.24 m Systemic Diam: 2.00 cm  Julien Nordmann MD Electronically signed by Julien Nordmann MD Signature Date/Time: 07/23/2023/1:56:02 PM    Final      CT SCANS  CT CORONARY MORPH W/CTA COR W/SCORE 05/18/2020  Addendum 05/18/2020  5:32 PM ADDENDUM REPORT: 05/18/2020 17:29  CLINICAL DATA:  61F with pericarditis and chest pain.  EXAM: Cardiac/Coronary  CT  TECHNIQUE: The patient was scanned on a [employer].  FINDINGS: A 120 kV prospective scan was triggered in the descending thoracic aorta at 111 HU's. Axial non-contrast 3 mm slices were carried out through the heart. The data set was analyzed on a dedicated work station and scored using the Agatson method. Gantry rotation speed was 250 msecs and collimation was .6 mm. No beta blockade and 0.8 mg of sl NTG was given. The 3D data set was reconstructed in 5% intervals of the 67-82 % of the R-R cycle. Diastolic phases were analyzed on a dedicated work station using MPR, MIP and VRT modes. The patient received 80 cc of contrast.  Aorta: Normal size. Ascending aorta 3.0 cm. No calcifications. No dissection.  Aortic Valve:  Trileaflet.  No calcifications.  Coronary Arteries:  Normal coronary origin.  Right dominance.  RCA is a large dominant artery that gives rise to PDA and PLVB. There is no plaque.  Left main is a large artery that gives rise to LAD and LCX arteries.  LAD is a large vessel that has no plaque.  LCX is a non-dominant artery that gives rise to two large OM branches. There is no plaque.  Other findings:  Normal pulmonary vein  drainage into the left atrium.  Normal let atrial appendage without a thrombus.  Normal size of the pulmonary artery.  IMPRESSION: 1. Coronary calcium score of 0. This was 0 percentile for age and sex matched control.  2. Normal coronary origin with right dominance.  3. No evidence of CAD.  Chilton Si, MD   Electronically Signed By: Chilton Si On: 05/18/2020 17:29  Narrative EXAM: OVER-READ INTERPRETATION  CT CHEST  The following report is an over-read performed by radiologist Dr. Trudie Reed of [facility] on 05/18/2020. This over-read does not include interpretation of cardiac or coronary anatomy or pathology. The coronary calcium score/coronary CTA interpretation by the cardiologist is attached.  COMPARISON:  None.  FINDINGS: Within the visualized portions of the thorax there are no suspicious appearing pulmonary nodules or masses, there is no acute consolidative airspace disease, no pleural effusions, no pneumothorax and no lymphadenopathy. Visualized portions of the upper abdomen are unremarkable. There are no aggressive appearing lytic or blastic lesions noted in the visualized portions of the skeleton.  IMPRESSION: No significant incidental noncardiac findings are noted.  Electronically Signed: By: Trudie Reed M.D. On: 05/18/2020 11:51     ______________________________________________________________________________________________      Risk Assessment/Calculations:             Physical Exam:   VS:  BP 130/82 (BP Location: Left Arm, Patient Position: Sitting, Cuff Size: Large)   Pulse 83   Ht 5\' 7"  (1.702 m)   Wt 200 lb (90.7 kg)   LMP 09/11/2015   SpO2 95%   BMI 31.32 kg/m    Wt Readings from Last 3 Encounters:  11/02/23 200 lb (90.7 kg)  08/03/23 193 lb 12.8 oz (87.9 kg)  07/27/23 194 lb 1.6 oz (88 kg)    GEN: Well nourished, well developed in no acute distress NECK: No JVD;  No carotid bruits CARDIAC:  RRR, no murmurs, rubs, gallops RESPIRATORY:  Clear to auscultation without rales, wheezing or rhonchi  ABDOMEN: Soft, non-tender, non-distended EXTREMITIES:  No edema; No deformity   ASSESSMENT AND PLAN: .    Recurrent pericarditis -chest pain resolved since initiation of Arcalyst.  Continue same.  No longer requiring colchicine nor NSAID.  HTN -BP reasonably controlled today.  PCP recently increased losartan from 50 to 100 mg.  Discussed to monitor BP at home at least 2 hours after medications and sitting for 5-10 minutes.  If BP not at goal less than 130/80 at follow-up could consider transition from losartan to olmesartan versus addition of amlodipine.  BMI 31 -motivated to lose weight.  Reports only eating 1 meal per day at dinnertime.  Follows a pescatarian diet.  Discussed adding a midday protein and fiber rich snack.Walking for exercise and plans to resume some weight lifting with her home gym.       Dispo: follow up as scheduled with Dr. Duke Salvia per her preference  Signed, Alver Sorrow, NP

## 2023-11-02 NOTE — Patient Instructions (Addendum)
 Medication Instructions:  Your physician recommends that you continue on your current medications as directed. Please refer to the Current Medication list given to you today.  Follow-Up: At [facility], you and your health needs are our priority.  As part of our continuing mission to provide you with exceptional heart care, we have created designated Provider Care Teams.  These Care Teams include your primary Cardiologist (physician) and Advanced Practice Providers (APPs -  Physician Assistants and Nurse Practitioners) who all work together to provide you with the care you need, when you need it.  We recommend signing up for the patient portal called "MyChart".  Sign up information is provided on this After Visit Summary.  MyChart is used to connect with patients for Virtual Visits (Telemedicine).  Patients are able to view lab/test results, encounter notes, upcoming appointments, etc.  Non-urgent messages can be sent to your provider as well.   To learn more about what you can do with MyChart, go to [email or web address].    Your next appointment:   Follow up as scheduled   Other Instructions If blood pressure not controlled at follow up with primary care could consider transitioning Losartan to Olmesartan (a more potent version of an ARB). Another medication to consider could be Amlodipine as this significantly helps to lower blood pressure in African American individuals

## 2023-11-09 ENCOUNTER — Ambulatory Visit (INDEPENDENT_AMBULATORY_CARE_PROVIDER_SITE_OTHER): Payer: Self-pay | Admitting: Obstetrics and Gynecology

## 2023-11-09 ENCOUNTER — Encounter: Payer: Self-pay | Admitting: Obstetrics and Gynecology

## 2023-11-09 VITALS — BP 124/78 | HR 77 | Wt 199.0 lb

## 2023-11-09 DIAGNOSIS — R350 Frequency of micturition: Secondary | ICD-10-CM

## 2023-11-09 DIAGNOSIS — B3731 Acute candidiasis of vulva and vagina: Secondary | ICD-10-CM

## 2023-11-09 DIAGNOSIS — N898 Other specified noninflammatory disorders of vagina: Secondary | ICD-10-CM

## 2023-11-09 LAB — WET PREP FOR TRICH, YEAST, CLUE

## 2023-11-09 MED ORDER — FLUCONAZOLE 150 MG PO TABS: 150.0000 mg | ORAL_TABLET | ORAL | 0 refills | Status: AC

## 2023-11-09 NOTE — Patient Instructions (Signed)
 Avoid scented soaps, lotions, and menstrual products, perfumes. Also avoid douching and applying any products directly into the vagina.

## 2023-11-09 NOTE — Progress Notes (Signed)
 53 y.o. G44P1102 female s/p LAVH here for urinary and vaginal sx. Married.  Patient's last menstrual period was 09/11/2015.   UTI symptoms -  urgency, incomplete emptying x 2 weeks. No dysuria, vaginal odor. Took probiotic and cranberries. Also used vaginal pH cream.   OB History  Gravida Para Term Preterm AB Living  2 2 1 1  2   SAB IAB Ectopic Multiple Live Births      2    # Outcome Date GA Lbr Len/2nd Weight Sex Type Anes PTL Lv  2 Preterm     F CS-Unspec  Y LIV  1 Term     M CS-Unspec  N LIV    Past Medical History:  Diagnosis Date   Acne    Anemia    Anxiety    Chest pain    Constipation    Depression    Elevated troponin 04/28/2018   Endometriosis    Food allergy    HELLP (hemolytic anemia/elev liver enzymes/low platelets in pregnancy)    hx of two pregnancies   Hypertension    Joint pain    Kidney problem    Lactose intolerance    Leg cramps    Low back pain    Miscarriage    Normal spontaneous vaginal delivery    Obesity    Obesity (BMI 30.0-34.9) 11/07/2020   Other fatigue    Palpitation    Pericarditis    Pure hypercholesterolemia 09/05/2021   Sleep apnea    SOB (shortness of breath) on exertion    Thyroid dysfunction    Vitamin B 12 deficiency    Vitamin D deficiency     Past Surgical History:  Procedure Laterality Date   ABDOMINOPLASTY     BILATERAL SALPINGECTOMY Right 10/16/2015   Procedure: RIGHT  SALPINGECTOMY;  Surgeon: Ok Edwards, MD;  Location: [facility];  Service: Gynecology;  Laterality: Right;   BREAST EXCISIONAL BIOPSY Left 2021   BUNIONECTOMY Right 11/30/2015   @ [facility]   BUNIONECTOMY Left 12/21/2015   @ [facility]   BUNIONECTOMY Left 03/07/2016   @[facility]     CERVICAL ABLATION     CESAREAN SECTION     CYSTOSCOPY N/A 10/16/2015   Procedure: CYSTOSCOPY;  Surgeon: Ok Edwards, MD;  Location: [facility];  Service: Gynecology;  Laterality: N/A;   DILATION AND CURETTAGE OF UTERUS     LAPAROSCOPIC VAGINAL HYSTERECTOMY WITH SALPINGO  OOPHORECTOMY Left 10/16/2015   Procedure: LAPAROSCOPIC ASSISTED VAGINAL HYSTERECTOMY WITH SALPINGO OOPHORECTOMY;  Surgeon: Ok Edwards, MD;  Location: [facility];  Service: Gynecology;  Laterality: Left;   TUBAL LIGATION      Current Outpatient Medications on File Prior to Visit  Medication Sig Dispense Refill   amphetamine-dextroamphetamine (ADDERALL XR) 15 MG 24 hr capsule Take 15 mg by mouth every morning.     ARCALYST 220 MG injection Inject into the skin.     Cholecalciferol (VITAMIN D3) 50 MCG (2000 UT) TABS Take 4,000 Int'l Units by mouth daily. 30 tablet 0   clonazePAM (KLONOPIN) 0.5 MG tablet Take 0.25 mg by mouth daily as needed.     Cyanocobalamin (VITAMIN B-12 PO) Take 5,000 mcg by mouth daily.     EPINEPHrine 0.3 mg/0.3 mL IJ SOAJ injection Inject into the muscle.     fexofenadine-pseudoephedrine (ALLEGRA-D ALLERGY & CONGESTION) 180-240 MG 24 hr tablet Take 1 tablet by mouth as needed.     FLUoxetine (PROZAC) 20 MG capsule Take 20 mg by mouth daily.     hydrOXYzine (  ATARAX) 25 MG tablet Take 25 mg by mouth at bedtime as needed.     ibuprofen (ADVIL) 200 MG tablet Take 800 mg (4 tablets) three times a day for 2 weeks. Take 600 mg (3 tablets) three times a day of 1 week. Take 400 mg (2 tablets) three times a a day for 1 week. Then once a day as needed 280 tablet 0   losartan (COZAAR) 100 MG tablet Take 100 mg by mouth daily.     Multiple Vitamins-Minerals (CENTRUM SILVER 50+WOMEN) TABS Take 1 tablet by mouth daily.     UNABLE TO FIND Take by mouth daily. Med Name: GOLI APPLE CIDER VINEGAR     UNABLE TO FIND Take by mouth daily. Med Name: GOLI BEETS     colchicine 0.6 MG tablet 1 tablet Orally Once a day     No current facility-administered medications on file prior to visit.    Allergies  Allergen Reactions   Iodine Anaphylaxis   Other Anaphylaxis   Shellfish Allergy Anaphylaxis      PE Today's Vitals   11/09/23 1628  BP: 124/78  Pulse: 77  TempSrc: Oral  SpO2: 98%   Weight: 199 lb (90.3 kg)   Body mass index is 31.17 kg/m.  Physical Exam Vitals reviewed. Exam conducted with a chaperone present.  Constitutional:      General: She is not in acute distress.    Appearance: Normal appearance.  HENT:     Head: Normocephalic and atraumatic.     Nose: Nose normal.  Eyes:     Extraocular Movements: Extraocular movements intact.     Conjunctiva/sclera: Conjunctivae normal.  Pulmonary:     Effort: Pulmonary effort is normal.  Genitourinary:    General: Normal vulva.     Exam position: Lithotomy position.     Vagina: Vaginal discharge present.     Cervix: No cervical motion tenderness.     Uterus: Absent.      Adnexa: Right adnexa normal and left adnexa normal.     Comments: Uterus and cervix absent Musculoskeletal:        General: Normal range of motion.     Cervical back: Normal range of motion.  Neurological:     General: No focal deficit present.     Mental Status: She is alert.  Psychiatric:        Mood and Affect: Mood normal.        Behavior: Behavior normal.      Assessment and Plan:        Urinary frequency -     Urinalysis,Complete w/RFL Culture  Vaginal discharge -     WET PREP FOR TRICH, YEAST, CLUE  Yeast vaginitis -     Fluconazole; Take 1 tablet (150 mg total) by mouth every 3 (three) days for 2 doses.  Dispense: 2 tablet; Refill: 0     Rosalyn Gess, MD

## 2023-11-10 LAB — URINALYSIS, COMPLETE W/RFL CULTURE
Bilirubin Urine: NEGATIVE
Glucose, UA: NEGATIVE
Hgb urine dipstick: NEGATIVE
Hyaline Cast: NONE SEEN /LPF
Ketones, ur: NEGATIVE
Leukocyte Esterase: NEGATIVE
Nitrites, Initial: NEGATIVE
Protein, ur: NEGATIVE
RBC / HPF: NONE SEEN /HPF (ref 0–2)
Specific Gravity, Urine: 1.025 (ref 1.001–1.035)
pH: 6.5 (ref 5.0–8.0)

## 2023-11-10 LAB — URINE CULTURE
MICRO NUMBER:: 16209304
Result:: NO GROWTH
SPECIMEN QUALITY:: ADEQUATE

## 2023-11-10 LAB — CULTURE INDICATED

## 2023-11-11 ENCOUNTER — Encounter: Payer: Self-pay | Admitting: Obstetrics and Gynecology

## 2023-11-18 ENCOUNTER — Ambulatory Visit: Payer: Self-pay | Admitting: Obstetrics and Gynecology

## 2023-11-23 ENCOUNTER — Encounter (HOSPITAL_BASED_OUTPATIENT_CLINIC_OR_DEPARTMENT_OTHER): Payer: Self-pay

## 2023-11-25 ENCOUNTER — Encounter (HOSPITAL_BASED_OUTPATIENT_CLINIC_OR_DEPARTMENT_OTHER): Payer: Self-pay | Admitting: Cardiovascular Disease

## 2023-11-25 ENCOUNTER — Ambulatory Visit (HOSPITAL_BASED_OUTPATIENT_CLINIC_OR_DEPARTMENT_OTHER): Payer: BC Managed Care – PPO | Admitting: Cardiovascular Disease

## 2023-11-25 VITALS — BP 130/88 | HR 70 | Ht 67.0 in | Wt 191.8 lb

## 2023-11-25 DIAGNOSIS — Z5181 Encounter for therapeutic drug level monitoring: Secondary | ICD-10-CM

## 2023-11-25 DIAGNOSIS — I319 Disease of pericardium, unspecified: Secondary | ICD-10-CM

## 2023-11-25 DIAGNOSIS — E7800 Pure hypercholesterolemia, unspecified: Secondary | ICD-10-CM | POA: Diagnosis not present

## 2023-11-25 DIAGNOSIS — I10 Essential (primary) hypertension: Secondary | ICD-10-CM | POA: Diagnosis not present

## 2023-11-25 DIAGNOSIS — E66811 Obesity, class 1: Secondary | ICD-10-CM

## 2023-11-25 MED ORDER — LOSARTAN POTASSIUM-HCTZ 50-12.5 MG PO TABS: 1.0000 | ORAL_TABLET | Freq: Every day | ORAL | 3 refills | Status: AC

## 2023-11-25 NOTE — Patient Instructions (Signed)
 Medication Instructions:  STOP LOSARTAN  START LOSARTAN-HCT 50-12.5 MG DAILY   *If you need a refill on your cardiac medications before your next appointment, please call your pharmacy*  Lab Work: FASTING LP/CEMT/A1C IN 1 WEEK   If you have labs (blood work) drawn today and your tests are completely normal, you will receive your results only by: MyChart Message (if you have MyChart) OR A paper copy in the mail If you have any lab test that is abnormal or we need to change your treatment, we will call you to review the results.  Testing/Procedures: NONE  Follow-Up: At [facility], you and your health needs are our priority.  As part of our continuing mission to provide you with exceptional heart care, our providers are all part of one team.  This team includes your primary Cardiologist (physician) and Advanced Practice Providers or APPs (Physician Assistants and Nurse Practitioners) who all work together to provide you with the care you need, when you need it.  Your next appointment:   6 month(s)  Provider:   Chilton Si, MD or Gillian Shields, NP   We recommend signing up for the patient portal called "MyChart".  Sign up information is provided on this After Visit Summary.  MyChart is used to connect with patients for Virtual Visits (Telemedicine).  Patients are able to view lab/test results, encounter notes, upcoming appointments, etc.  Non-urgent messages can be sent to your provider as well.   To learn more about what you can do with MyChart, go to [email or web address].

## 2023-11-25 NOTE — Progress Notes (Signed)
 Cardiology Office Note:  .   Date:  11/25/2023  ID:  [name], DOB [birth year], MRN [id number] PCP: Harvest Forest, MD  [facility] Providers Cardiologist:  Chilton Si, MD    History of Present Illness: .   [name] is a 53 y.o. female with recurrent pericarditis, hypertension, and hyperlipidemia who presents for follow up.  [name] was admitted 04/2018 with chest pain.  She has a history of palpitations that she attributes to stress at work.  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  However, for the week before she went to the hospital she noted feeling more anxious.  She had no URI at the time.  She was seen in the emergency department where EKG was initially concerning for STEMI.  Troponin was elevated to 0.75 and ESR was 30.  She was thought to have myopericarditis and was started on ibuprofen and colchicine.  She was also given a prescription for oxycodone.  She followed up in clinic 9/12 and was doing better but still had some pleuritic chest pain.  She started to become more active but developed recurrent chest pain.  She had a repeat echocardiogram 05/14/2018 that revealed LVEF 60 to 65% with no pericardial effusion.  She did have a left pleural effusion that was unchanged from the hospital.  She followed up with her rheumatologist, Dr. Zenovia Jordan, 04/2018.  She was noted to have a mildly positive rheumatoid factor, ESR, and CRP.  She tried tapering her ibuprofen and had recurrent pain after decreasing to twice a day.  It was decided that she would resume both ibuprofen and colchicine for the holidays.    [name] was seen by Joni Reining, DNP on 05/2019.  She was doing well at that time.  She saw her rheumatologist and started back on colchicine, ibuprofen and nebivolol.  It has subsequently been discontinued.  Overall she has felt well physically.  She had an episode of chest tightness that occurred with  stress.  She had a coronary CT-A 04/2020 that revealed no coronary calcium and no CAD.  [name] was seen in the ED 03/2021 with chest pain like her prior pericarditis.  She felt tightness in her chest and it was worse laying down.  She had a limited bedside echocardiogram by the ED physician and was not noted to have a pericardial effusion.  Cardiac enzymes were negative.  Her symptoms improved with Toradol.  She was started back on colchicine. By the time she followed up in clinic the next month she was feeling better. She had a full Echo 04/2021 that was unremarkable and had no pericardial effusion.    At her visit 08/2021 she was doing well.  She saw Reather Littler, NP 06/2023 and reported symptoms of recurrent pericarditis.  She was started on colchicine and ibuprofen.  ESR and CRP were within normal limits.  She continued to have CP at her appointment 07/2023.  She was started on Arcalyst. Losartan was also increased.  She follow-up with Gillian Shields, NP 10/2023 and was tolerating the medication well had some anxiety about her requirement to use an injection.  She recently increased losartan to 100 mg due to persistently elevated blood pressures.  Discussed the use of AI scribe software for clinical note transcription with the patient, who gave verbal consent to proceed.  History of Present Illness [name] is experiencing improvement in her recurrent pericarditis symptoms with the use of  Arcalyst, although she notes a stinging sensation at the injection site. She missed a dose previously and noticed a significant difference in her chest symptoms, indicating the medication's effectiveness. With the medication, she can engage in physical activities like walking longer distances without experiencing chest pain.  She is currently taking Arcalyst for her recurrent pericarditis and finds it helpful. She is also on losartan for blood pressure management, which has been adjusted multiple times. She tolerates  it well and has not been checking her blood pressure at home but has it monitored during her weekly therapy sessions. Her blood pressure was initially high but has been reading around 130/80 mmHg recently. She has experienced occasional swelling in her feet or legs.  She is battling with her weight and has been trying to manage her stress, which she believes impacts her condition. She follows a pescatarian diet, eating once or twice a day, and includes fruits in her meals. She previously participated in a healthy weight and wellness program but did not continue due to dissatisfaction with the initial counselor. She is considering trying a new program recommended by a friend.  She has been on leave from work, which has helped her manage stress better. She has a therapist and a psychologist, which she finds beneficial. She is working on establishing a consistent workout routine and reports being able to work out without chest pain.   ROS:  As per HPI  Studies Reviewed: .       Echo 06/2023: 1. Left ventricular ejection fraction, by estimation, is 60 to 65%. The  left ventricle has normal function. The left ventricle has no regional  wall motion abnormalities. Left ventricular diastolic parameters were  normal. The average left ventricular  global longitudinal strain is -16.1 %.   2. Right ventricular systolic function is normal. The right ventricular  size is normal. There is normal pulmonary artery systolic pressure. The  estimated right ventricular systolic pressure is 24.5 mmHg.   3. The mitral valve is normal in structure. Mild mitral valve  regurgitation. No evidence of mitral stenosis.   4. The aortic valve is tricuspid. Aortic valve regurgitation is not  visualized. No aortic stenosis is present.   5. The inferior vena cava is normal in size with greater than 50%  respiratory variability, suggesting right atrial pressure of 3 mmHg.   Risk Assessment/Calculations:              Physical Exam:   VS:  BP 130/88   Pulse 70   Ht 5\' 7"  (1.702 m)   Wt 191 lb 12.8 oz (87 kg)   LMP 09/11/2015   SpO2 97%   BMI 30.04 kg/m  , BMI Body mass index is 30.04 kg/m. GENERAL:  Well appearing HEENT: Pupils equal round and reactive, fundi not visualized, oral mucosa unremarkable NECK:  No jugular venous distention, waveform within normal limits, carotid upstroke brisk and symmetric, no bruits, no thyromegaly LYMPHATICS:  No cervical adenopathy LUNGS:  Clear to auscultation bilaterally HEART:  RRR.  PMI not displaced or sustained,S1 and S2 within normal limits, no S3, no S4, no clicks, no rubs, no murmurs ABD:  Flat, positive bowel sounds normal in frequency in pitch, no bruits, no rebound, no guarding, no midline pulsatile mass, no hepatomegaly, no splenomegaly EXT:  2 plus pulses throughout, no edema, no cyanosis no clubbing SKIN:  No rashes no nodules NEURO:  Cranial nerves II through XII grossly intact, motor grossly intact throughout PSYCH:  Cognitively intact, oriented to  person place and time   ASSESSMENT AND PLAN: .    Assessment & Plan # Recurrent Pericarditis Recurrent pericarditis persists with improvement on Arcalyst. Long-term management required. Stress management crucial to reduce inflammation. No significant systemic side effects reported. - Continue Arcalyst for recurrent pericarditis. - Encourage stress management techniques and continue therapy sessions.  # Hypertension Blood pressure around 130/80 mmHg, usually slightly higher. Losartan may not be optimal as monotherapy. Plan to add hydrochlorothiazide for better control. - Prescribe losartan/hydrochlorothiazide 50/12.5mg  combination therapy.  Can double if BP is not consistently <130/80. - Check CMP in one week to monitor potassium levels. - Perform fasting labs including lipid panel, comprehensive metabolic panel, and A1c.  # Weight Management # Obesity: Struggling with weight management. Previous  negative experience with [facility]. Considering [facility] for support. Diabetes status may influence medication options. - Encourage exploration of [facility] for weight management support. - Evaluate A1c to assess for diabetes, which may impact weight loss medication options. -Encourage exercising at least 150 minutes weekly.   Dispo: f/u in 6 months  Signed, Chilton Si, MD

## 2023-11-26 ENCOUNTER — Encounter: Payer: Self-pay | Admitting: Obstetrics and Gynecology

## 2023-11-26 ENCOUNTER — Ambulatory Visit (INDEPENDENT_AMBULATORY_CARE_PROVIDER_SITE_OTHER): Admitting: Obstetrics and Gynecology

## 2023-11-26 VITALS — BP 122/78 | HR 75 | Temp 97.5°F | Ht 66.0 in | Wt 192.0 lb

## 2023-11-26 DIAGNOSIS — Z01419 Encounter for gynecological examination (general) (routine) without abnormal findings: Secondary | ICD-10-CM | POA: Insufficient documentation

## 2023-11-26 DIAGNOSIS — N951 Menopausal and female climacteric states: Secondary | ICD-10-CM | POA: Insufficient documentation

## 2023-11-26 DIAGNOSIS — Z1331 Encounter for screening for depression: Secondary | ICD-10-CM | POA: Diagnosis not present

## 2023-11-26 MED ORDER — GABAPENTIN 100 MG PO CAPS: ORAL_CAPSULE | ORAL | 0 refills | Status: AC

## 2023-11-26 NOTE — Assessment & Plan Note (Addendum)
 Discussed nonhormonal options for VMS, including SSRIs, veozah, gabapetin.  Wants to start gabapentin. Discussed side effects including mood irritability, insomnia, agitation, drowsiness, dizziness.

## 2023-11-26 NOTE — Patient Instructions (Signed)

## 2023-11-26 NOTE — Assessment & Plan Note (Signed)
 Cervical cancer screening performed according to ASCCP guidelines. Encouraged annual mammogram screening Colonoscopy UTD Labs and immunizations with her primary Encouraged safe sexual practices as indicated Encouraged healthy lifestyle practices with diet and exercise For patients under 50-53yo, I recommend 1200mg  calcium daily and 600IU of vitamin D daily.

## 2023-11-26 NOTE — Progress Notes (Signed)
 53 y.o. 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 Married. 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  Patient's last menstrual period was 09/11/2015.   PHQ-9: 9, feel she is having a midlife crisis.  Currently seeing psychiatry and psychology twice a week and is on Prozac. On medical leave from current job however planning to return April 27.  Considering alternative career paths.  Reporting regular hot flashes.  Abnormal bleeding: none Pelvic discharge or pain: none Breast mass, nipple discharge or skin changes : none Last PAP:     Component Value Date/Time   DIAGPAP  04/16/2022 1634    - Negative for intraepithelial lesion or malignancy (NILM)   ADEQPAP Satisfactory for evaluation. 04/16/2022 1634   Last mammogram: 08/27/2023 BI-RADS 1, density B Last colonoscopy: 2023 Sexually active: yes  Exercising: No Smoker: No  GYN HISTORY: LAVH  OB History  Gravida Para Term Preterm AB Living  3 2 1 1 1 2   SAB IAB Ectopic Multiple Live Births  1    2    # Outcome Date GA Lbr Len/2nd Weight Sex Type Anes PTL Lv  3 SAB           2 Preterm     F CS-Unspec  Y LIV  1 Term     M CS-Unspec  N LIV    Past Medical History:  Diagnosis Date   Acne    Anemia    Anxiety    Chest pain    Constipation    Depression    Elevated troponin 04/28/2018   Endometriosis    Food allergy    HELLP (hemolytic anemia/elev liver enzymes/low platelets in pregnancy)    hx of two pregnancies   Hypertension    Joint pain    Kidney problem    Lactose intolerance    Leg cramps    Low back pain    Miscarriage    Normal spontaneous vaginal delivery    Obesity    Obesity (BMI 30.0-34.9) 11/07/2020   Other fatigue    Palpitation    Pericarditis    Primary hypertension    Pure hypercholesterolemia 09/05/2021   Sleep apnea    SOB (shortness of breath) on exertion    Thyroid dysfunction    Vitamin B 12  deficiency    Vitamin D deficiency     Past Surgical History:  Procedure Laterality Date   ABDOMINOPLASTY     BILATERAL SALPINGECTOMY Right 10/16/2015   Procedure: RIGHT  SALPINGECTOMY;  Surgeon: Ok Edwards, MD;  Location: [facility];  Service: Gynecology;  Laterality: Right;   BREAST EXCISIONAL BIOPSY Left 2021   BUNIONECTOMY Right 11/30/2015   @ [facility]   BUNIONECTOMY Left 12/21/2015   @ [facility]   BUNIONECTOMY Left 03/07/2016   @[facility]     CERVICAL ABLATION     CESAREAN SECTION     CYSTOSCOPY N/A 10/16/2015   Procedure: CYSTOSCOPY;  Surgeon: Ok Edwards, MD;  Location: [facility];  Service: Gynecology;  Laterality: N/A;   DILATION AND CURETTAGE OF UTERUS     LAPAROSCOPIC VAGINAL HYSTERECTOMY WITH SALPINGO OOPHORECTOMY Left 10/16/2015   Procedure: LAPAROSCOPIC ASSISTED VAGINAL HYSTERECTOMY WITH SALPINGO OOPHORECTOMY;  Surgeon: Ok Edwards, MD;  Location: [facility];  Service: Gynecology;  Laterality: Left;   TUBAL LIGATION      Current Outpatient Medications on File Prior to Visit  Medication Sig Dispense Refill   ARCALYST 220 MG injection Inject into the  skin.     Cholecalciferol (VITAMIN D3) 50 MCG (2000 UT) TABS Take 4,000 Int'l Units by mouth daily. 30 tablet 0   colchicine 0.6 MG tablet      Cyanocobalamin (VITAMIN B-12 PO) Take 5,000 mcg by mouth daily.     EPINEPHrine 0.3 mg/0.3 mL IJ SOAJ injection Inject into the muscle.     fexofenadine-pseudoephedrine (ALLEGRA-D ALLERGY & CONGESTION) 180-240 MG 24 hr tablet Take 1 tablet by mouth as needed.     FLUoxetine (PROZAC) 40 MG capsule Take 40 mg by mouth daily.     hydrOXYzine (ATARAX) 25 MG tablet Take 25 mg by mouth at bedtime as needed.     ibuprofen (ADVIL) 200 MG tablet Take 800 mg (4 tablets) three times a day for 2 weeks. Take 600 mg (3 tablets) three times a day of 1 week. Take 400 mg (2 tablets) three times a a day for 1 week. Then once a day as needed 280 tablet 0   losartan-hydrochlorothiazide (HYZAAR) 50-12.5 MG tablet  Take 1 tablet by mouth daily. 90 tablet 3   Multiple Vitamins-Minerals (CENTRUM SILVER 50+WOMEN) TABS Take 1 tablet by mouth daily.     No current facility-administered medications on file prior to visit.    Social History   Socioeconomic History   Marital status: Married    Spouse name: Tyrone   Number of children: Not on file   Years of education: Not on file   Highest education level: Not on file  Occupational History   Occupation: Dentist school principal  Tobacco Use   Smoking status: Never   Smokeless tobacco: Never  Vaping Use   Vaping status: Never Used  Substance and Sexual Activity   Alcohol use: Not Currently    Comment: WINE.... OCC   Drug use: No   Sexual activity: Yes    Partners: Male    Birth control/protection: Surgical    Comment: 1st intercourse- 18, partners- 3  Other Topics Concern   Not on file  Social History Narrative   Lives with husband   Social Drivers of Corporate investment banker Strain: Not on file  Food Insecurity: Not on file  Transportation Needs: Not on file  Physical Activity: Not on file  Stress: Not on file  Social Connections: Unknown (01/17/2022)   [facility], [facility]   Social Network    Social Network: Not on file  Intimate Partner Violence: Unknown (01/17/2022)   Received from [employer], [facility]   HITS    Physically Hurt: Not on file    Insult or Talk Down To: Not on file    Threaten Physical Harm: Not on file    Scream or Curse: Not on file    Family History  Problem Relation Age of Onset   Diabetes Mother    Hypertension Mother    Thyroid disease Mother    Cancer Mother        THYROID   Hyperlipidemia Mother    Obesity Mother    Lymphoma Father    Cataracts Father    Heart failure Father    Cancer Father    Anxiety disorder Father    Diabetes Brother    Cancer Brother        lymphoma   Heart disease Maternal Grandfather    Heart failure Maternal Grandfather    Stroke  Maternal Grandfather    Sickle cell anemia Maternal Aunt    Kidney failure Maternal Grandmother    Diabetes  Maternal Grandmother     Allergies  Allergen Reactions   Iodine Anaphylaxis   Other Anaphylaxis   Shellfish Allergy Anaphylaxis      PE Today's Vitals   11/26/23 1330  BP: 122/78  Pulse: 75  Temp: (!) 97.5 F (36.4 C)  TempSrc: Oral  SpO2: 99%  Weight: 192 lb (87.1 kg)  Height: 5\' 6"  (1.676 m)   Body mass index is 30.99 kg/m.  Physical Exam Vitals reviewed. Exam conducted with a chaperone present.  Constitutional:      General: She is not in acute distress.    Appearance: Normal appearance.  HENT:     Head: Normocephalic and atraumatic.     Nose: Nose normal.  Eyes:     Extraocular Movements: Extraocular movements intact.     Conjunctiva/sclera: Conjunctivae normal.  Neck:     Thyroid: No thyroid mass, thyromegaly or thyroid tenderness.  Pulmonary:     Effort: Pulmonary effort is normal.  Chest:     Chest wall: No mass or tenderness.  Breasts:    Right: Normal. No swelling, mass, nipple discharge or tenderness.     Left: Normal. No swelling, mass, nipple discharge or tenderness.  Abdominal:     General: There is no distension.     Palpations: Abdomen is soft.     Tenderness: There is no abdominal tenderness.  Genitourinary:    General: Normal vulva.     Exam position: Lithotomy position.     Urethra: No prolapse.     Vagina: Normal. No vaginal discharge or bleeding.     Cervix: No lesion.     Adnexa: Right adnexa normal and left adnexa normal.     Comments: Cervix and uterus absent Musculoskeletal:        General: Normal range of motion.     Cervical back: Normal range of motion.  Lymphadenopathy:     Upper Body:     Right upper body: No axillary adenopathy.     Left upper body: No axillary adenopathy.     Lower Body: No right inguinal adenopathy. No left inguinal adenopathy.  Skin:    General: Skin is warm and dry.  Neurological:      General: No focal deficit present.     Mental Status: She is alert.  Psychiatric:        Mood and Affect: Mood normal.        Behavior: Behavior normal.     Assessment and Plan:        Well woman exam with routine gynecological exam Assessment & Plan: Cervical cancer screening performed according to ASCCP guidelines. Encouraged annual mammogram screening Colonoscopy UTD Labs and immunizations with her primary Encouraged safe sexual practices as indicated Encouraged healthy lifestyle practices with diet and exercise For patients under 50-70yo, I recommend 1200mg  calcium daily and 600IU of vitamin D daily.    Vasomotor symptoms due to menopause Assessment & Plan: Discussed nonhormonal options for VMS, including SSRIs, veozah, gabapetin.  Wants to start gabapentin. Discussed side effects including mood irritability, insomnia, agitation, drowsiness, dizziness.  Orders: -     Gabapentin; Take 1 capsule (100 mg total) by mouth at bedtime for 3 days, THEN 2 capsules (200 mg total) at bedtime for 3 days, THEN 3 capsules (300 mg total) at bedtime.  Dispense: 261 capsule; Refill: 0  Rosalyn Gess, MD

## 2023-12-01 ENCOUNTER — Encounter (HOSPITAL_BASED_OUTPATIENT_CLINIC_OR_DEPARTMENT_OTHER): Payer: Self-pay | Admitting: Family

## 2024-05-06 ENCOUNTER — Other Ambulatory Visit: Payer: Self-pay | Admitting: Internal Medicine

## 2024-05-06 DIAGNOSIS — Z1231 Encounter for screening mammogram for malignant neoplasm of breast: Secondary | ICD-10-CM

## 2024-06-08 ENCOUNTER — Telehealth (HOSPITAL_BASED_OUTPATIENT_CLINIC_OR_DEPARTMENT_OTHER): Payer: Self-pay | Admitting: *Deleted

## 2024-06-08 NOTE — Telephone Encounter (Signed)
 Good morning. I ran into this patient this week and she said htat her insurance is no longer paying for Archalyst. Can we see if our PharmD team can reach out to find a way for coverage?   Above message received from Dr Raford, will forward to Pharm D for review

## 2024-06-23 ENCOUNTER — Telehealth: Payer: Self-pay | Admitting: Pharmacy Technician

## 2024-06-23 ENCOUNTER — Other Ambulatory Visit (HOSPITAL_COMMUNITY): Payer: Self-pay

## 2024-06-23 NOTE — Telephone Encounter (Signed)
   Insurance said pa still good until 09/14/24 [facility] said [facility] can [facility] if the patient calls in a refill Sent pt a message

## 2024-06-27 NOTE — Telephone Encounter (Signed)
 Called pt and advised that she [facility]. She said they told her it was 2000. Advised she call and make sure they were running copay card too

## 2024-08-17 ENCOUNTER — Telehealth: Payer: Self-pay

## 2024-08-17 ENCOUNTER — Other Ambulatory Visit (HOSPITAL_COMMUNITY): Payer: Self-pay

## 2024-08-17 NOTE — Telephone Encounter (Signed)
 Pharmacy Patient Advocate Encounter   Received notification from Fax that prior authorization for ARCALYST is required/requested.   Insurance verification completed.   The patient is insured through [facility].   Per test claim: PA required; PA submitted to above mentioned insurance via Latent Key/confirmation #/EOC [id number] Status is pending

## 2024-08-23 NOTE — Telephone Encounter (Signed)
 Pharmacy Patient Advocate Encounter  Received notification from [facility] that Prior Authorization for ARCALYST has been APPROVED from 08/22/24 to 08/22/25

## 2024-09-22 ENCOUNTER — Ambulatory Visit
Admission: RE | Admit: 2024-09-22 | Discharge: 2024-09-22 | Disposition: A | Discharge status: Home / Self Care | Source: Ambulatory Visit | Attending: Internal Medicine | Admitting: Internal Medicine

## 2024-09-22 DIAGNOSIS — Z1231 Encounter for screening mammogram for malignant neoplasm of breast: Secondary | ICD-10-CM

## 2024-11-29 ENCOUNTER — Ambulatory Visit: Admitting: Obstetrics and Gynecology
